# Patient Record
Sex: Male | Born: 1950
Health system: Southern US, Community
[De-identification: ages and names within clinical notes are randomized; demographics above are authoritative.]

## PROBLEM LIST (undated history)

## (undated) DIAGNOSIS — M1990 Unspecified osteoarthritis, unspecified site: Secondary | ICD-10-CM

## (undated) DIAGNOSIS — C801 Malignant (primary) neoplasm, unspecified: Secondary | ICD-10-CM

## (undated) DIAGNOSIS — E119 Type 2 diabetes mellitus without complications: Secondary | ICD-10-CM

## (undated) DIAGNOSIS — I10 Essential (primary) hypertension: Secondary | ICD-10-CM

## (undated) DIAGNOSIS — D497 Neoplasm of unspecified behavior of endocrine glands and other parts of nervous system: Secondary | ICD-10-CM

## (undated) DIAGNOSIS — E785 Hyperlipidemia, unspecified: Secondary | ICD-10-CM

## (undated) DIAGNOSIS — L659 Nonscarring hair loss, unspecified: Secondary | ICD-10-CM

## (undated) DIAGNOSIS — G4700 Insomnia, unspecified: Secondary | ICD-10-CM

## (undated) DIAGNOSIS — Z87442 Personal history of urinary calculi: Secondary | ICD-10-CM

## (undated) HISTORY — DX: Hyperlipidemia, unspecified: E78.5

## (undated) HISTORY — PX: APPENDECTOMY: SHX54

## (undated) HISTORY — PX: LITHOTRIPSY: SUR834

## (undated) HISTORY — PX: REPLACEMENT TOTAL KNEE: SUR1224

## (undated) HISTORY — PX: COLONOSCOPY: SHX174

## (undated) HISTORY — DX: Unspecified osteoarthritis, unspecified site: M19.90

## (undated) HISTORY — PX: JOINT REPLACEMENT: SHX530

## (undated) MED FILL — Finasteride Tab 5 MG: 5.0000 mg | ORAL | Fill #1 | Status: AC

---

## 1999-01-31 ENCOUNTER — Ambulatory Visit (HOSPITAL_COMMUNITY): Admission: RE | Admit: 1999-01-31 | Discharge: 1999-01-31 | Payer: Self-pay | Admitting: Emergency Medicine

## 1999-01-31 ENCOUNTER — Encounter: Payer: Self-pay | Admitting: Emergency Medicine

## 2002-01-12 ENCOUNTER — Inpatient Hospital Stay (HOSPITAL_COMMUNITY): Admission: RE | Admit: 2002-01-12 | Discharge: 2002-01-14 | Payer: Self-pay | Admitting: Orthopedic Surgery

## 2002-01-12 ENCOUNTER — Encounter: Payer: Self-pay | Admitting: Orthopedic Surgery

## 2008-06-07 ENCOUNTER — Ambulatory Visit: Payer: Self-pay | Admitting: Family Medicine

## 2009-07-27 ENCOUNTER — Ambulatory Visit (HOSPITAL_COMMUNITY): Admission: RE | Admit: 2009-07-27 | Discharge: 2009-07-27 | Payer: Self-pay | Admitting: Gastroenterology

## 2009-08-01 LAB — HM COLONOSCOPY

## 2010-07-05 NOTE — H&P (Signed)
   NAME:  Adam Ware, Adam Ware NO.:  [id number]   MEDICAL RECORD NO.:  [id number]                   PATIENT TYPE:  OIB   LOCATION:  2550                                 FACILITY:  [facility]   PHYSICIAN:  Nadara Mustard, M.D.                DATE OF BIRTH:  [birth year]   DATE OF ADMISSION:  01/12/2002  DATE OF DISCHARGE:                                HISTORY & PHYSICAL   HISTORY OF PRESENT ILLNESS:  The patient is a 60 year old gentleman status  post medial and lateral meniscectomies through large open incisions. The  patient has chronic knee pain, pain with activities of daily living. The  patient has failed conservative care which has included arthroscopy, steroid  injections, Synvisc injections without relief. He is unable to perform  activities of daily living. Radiograph shows tricompartmental osteoarthritic  changes, and the patient presents at this time for a total arthroplasty.   ALLERGIES:  No known drug allergies.   MEDICATIONS:  1. Lipitor.  2. Propecia.   PAST HISTORY:  Positive for open medial and lateral meniscectomy surgeries.   SOCIAL HISTORY:  Negative for tobacco. Alcohol socially.   FAMILY HISTORY:  Positive for heart disease and diabetes.   PHYSICAL EXAMINATION:  VITAL SIGNS:  Temperature 97.4, heart rate 88,  respiratory rate 24, blood pressure 118/86.  GENERAL:  The patient is in no acute distress.  LUNGS:  Clear to auscultation.  CARDIOVASCULAR:  Regular, rate, and rhythm.  NECK:  Supple, no bruits.  EXTREMITIES:  Examination of the right knee has range of motion from 5 to  90% with no varus or valgus deformity.   Radiographs show tricompartmental osteoarthritic changes of the right knee.   ASSESSMENT:  Tricompartmental osteoarthritic changes, right knee.   PLAN:  Initially, we had planned to try to proceed with a unicompartmental  arthroplasty; however, with the most recent radiographs obtained that has  tricompartmental  involvement, it was felt that unicompartmental arthroplasty  would not be a good option. The patient presents at this time for a total  knee arthroplasty with the risks of infection, neurovascular injury,  persistent pain, need for additional surgery, DVT, pulmonary embolus were  discussed. The patient states he understands and wishes to proceed at this  time.                                              Nadara Mustard, M.D.   MVD/MEDQ  D:  01/12/2002  T:  01/12/2002  Job:  [id number]

## 2010-07-05 NOTE — Op Note (Signed)
NAME:  Adam Ware, Adam Ware NO.:  [id number]   MEDICAL RECORD NO.:  [id number]                   PATIENT TYPE:  OIB   LOCATION:  2550                                 FACILITY:  [facility]   PHYSICIAN:  Nadara Mustard, M.D.                DATE OF BIRTH:  [birth year]   DATE OF PROCEDURE:  01/12/2002  DATE OF DISCHARGE:                                 OPERATIVE REPORT   PREOPERATIVE DIAGNOSIS:  Tricompartmental osteoarthritis, right knee.   POSTOPERATIVE DIAGNOSIS:  Tricompartmental osteoarthritis, right knee.   OPERATION PERFORMED:  Right total knee arthroplasty with a #11 femoral  component, Osteonics #9 tibial tray with a 10 mm flex insert posterior  stabilized with a #9 patella.   SURGEON:  Nadara Mustard, M.D.   ANESTHESIA:  General.   ESTIMATED BLOOD LOSS:  Minimal.   ANTIBIOTICS:  1 g of Kefzol.   TOURNIQUET TIME:  108 minutes at 350 mmHg.   DISPOSITION:  To PACU in stable condition.   INDICATIONS FOR PROCEDURE:  The patient is a 60 year old gentleman with  chronic osteoarthritis of the right knee, status post meniscectomy, who has  failed conservative care and presents at this time for total knee  arthroplasty.  The risks and benefits were discussed.  The patient states he  understands and wishes to proceed at this time.   DESCRIPTION OF PROCEDURE:  The patient was brought to operating room 4 and  underwent a general anesthetic.  After an adequate level of anesthesia was  obtained, the patient's right lower extremity was prepped using DuraPrep and  draped into a sterile field.  Collier Flowers was used to cover all exposed skin.  A  midline incision was made over the patella.  A medial parapatellar  retinacular incision was made.  The patella had huge osteophytes on it and  was unable to be subluxed laterally.  The osteophytes were then removed and  the patella was resurfaced with 10 mm of the patella excised.  This allowed  easy movement of the patella  during the procedure.  The femoral cutting  guide was then used.  The intramedullary rod was placed in the femur and 12  mm were taken off the distal femur and the cutting block was sized for 11  and the Chamfer cuts were made for an 11 femur.  Attention was then focused  on the tibia.  This was set with a 5 degree posterior slope with neutral  varus or valgus alignment with the alignment rod down the first webspace and  4 mm plus 2 mm were taken off the tibia and the meniscal components and  debris were removed.  The contracture of the posterior capsule was also  released.  The box cut was then used to make the box cuts for the posterior  stabilized femur.  The drill holes were made in the patella for the #9  patella resurface.  The trial components were placed on the femur and tibia.  This showed full range of motion with full extension and full flexion of 130  degrees.  He had stable varus and valgus stress.  Instruments were removed.  Trials were removed.  The wound was irrigated with normal saline.  The  tibial component was marked and the tibial alignment again aligned down the  first webspace.  The cement was mixed and the femoral and tibial components  were cemented.  The cement was hardening quite rapidly and a new batch of  cement was mixed to secure the patella.  After all three components were  stabilized and the cement had hardened, the tibial tray was inserted.  The  knee was placed through a full range of motion.  There was no subluxation of  the patella and the patella tracked midline.  The tourniquet was deflated  after 108 minutes.  Hemostasis was obtained.  The capsule was closed using a  #1 Vicryl.  The deep subcu was closed using a 0 Vicryl.  The subcu was  closed using 2-0 Vicryl and the skin was closed using proximate staples.  The wound was covered with Adaptic orthopedic sponges, sterile Webril and an  Ace wrap from toes to thigh.  Prior to applying the dressing, 20  cc of 0.5%  Marcaine were injected into the joint.  The patient then received a femoral  nerve block by anesthesia.  E-Z ice pack was applied.  The patient was  extubated and taken to PACU in stable condition.                                                 Nadara Mustard, M.D.    MVD/MEDQ  D:  01/12/2002  T:  01/12/2002  Job:  [id number]

## 2012-04-06 ENCOUNTER — Other Ambulatory Visit: Payer: Self-pay | Admitting: Medical

## 2012-06-17 ENCOUNTER — Telehealth: Payer: Self-pay | Admitting: *Deleted

## 2012-06-17 MED ORDER — SULFAMETHOXAZOLE-TRIMETHOPRIM 800-160 MG PO TABS: 1.0000 | ORAL_TABLET | Freq: Two times a day (BID) | ORAL | Status: DC

## 2012-06-17 NOTE — Telephone Encounter (Signed)
Rx sent to [facility]. 

## 2012-06-19 ENCOUNTER — Telehealth: Payer: Self-pay | Admitting: Family Medicine

## 2012-06-19 DIAGNOSIS — L0390 Cellulitis, unspecified: Secondary | ICD-10-CM

## 2012-06-19 MED ORDER — CLINDAMYCIN HCL 300 MG PO CAPS: 600.0000 mg | ORAL_CAPSULE | Freq: Three times a day (TID) | ORAL | Status: DC

## 2012-06-19 MED ORDER — SULFAMETHOXAZOLE-TRIMETHOPRIM 800-160 MG PO TABS: 1.0000 | ORAL_TABLET | Freq: Two times a day (BID) | ORAL | Status: DC

## 2012-06-19 NOTE — Telephone Encounter (Signed)
Worsening cellulitis on nose, despite use of Septra DS.  He spoke with ID who recommended increasing Septra to TID and adding Clinda 600mg  TID

## 2012-08-09 ENCOUNTER — Other Ambulatory Visit: Payer: Self-pay | Admitting: Medical

## 2012-08-09 MED ORDER — FINASTERIDE 5 MG PO TABS: 5.0000 mg | ORAL_TABLET | Freq: Every day | ORAL | Status: DC

## 2012-09-07 ENCOUNTER — Other Ambulatory Visit: Payer: Self-pay | Admitting: Medical

## 2012-09-07 DIAGNOSIS — L0390 Cellulitis, unspecified: Secondary | ICD-10-CM

## 2012-09-07 MED ORDER — SULFAMETHOXAZOLE-TRIMETHOPRIM 800-160 MG PO TABS: 1.0000 | ORAL_TABLET | Freq: Two times a day (BID) | ORAL | Status: DC

## 2012-10-21 ENCOUNTER — Telehealth: Payer: Self-pay | Admitting: Family Medicine

## 2012-10-21 ENCOUNTER — Other Ambulatory Visit: Payer: 59

## 2012-10-21 DIAGNOSIS — L0291 Cutaneous abscess, unspecified: Secondary | ICD-10-CM

## 2012-10-21 DIAGNOSIS — T8130XA Disruption of wound, unspecified, initial encounter: Secondary | ICD-10-CM

## 2012-10-21 DIAGNOSIS — L0390 Cellulitis, unspecified: Secondary | ICD-10-CM

## 2012-10-21 DIAGNOSIS — A4902 Methicillin resistant Staphylococcus aureus infection, unspecified site: Secondary | ICD-10-CM

## 2012-10-21 MED ORDER — MUPIROCIN CALCIUM 2 % NA OINT: TOPICAL_OINTMENT | NASAL | Status: DC

## 2012-10-21 MED ORDER — SULFAMETHOXAZOLE-TRIMETHOPRIM 800-160 MG PO TABS: 1.0000 | ORAL_TABLET | Freq: Two times a day (BID) | ORAL | Status: AC

## 2012-10-21 NOTE — Telephone Encounter (Signed)
Recurrent rash--pustule on upper right forearm was drained--needs order for culture.  Also had wound on hand, and new multiple erythematous lesions on RLE.  Suspect MRSA.  Needs culture and treatment.  Suspect possible carrier given recurrences.

## 2012-10-24 LAB — WOUND CULTURE
Gram Stain: NONE SEEN
Gram Stain: NONE SEEN
Gram Stain: NONE SEEN

## 2013-01-26 ENCOUNTER — Other Ambulatory Visit: Payer: Self-pay | Admitting: Family Medicine

## 2013-01-26 NOTE — Telephone Encounter (Signed)
Is this okay to refill? 

## 2013-01-26 NOTE — Telephone Encounter (Signed)
Sending this to your per Dr.Knapp.

## 2013-01-26 NOTE — Telephone Encounter (Signed)
This is not something I have ever discussed with him.  Looks like Vincenza Hews has filled it in the past--you can check with him (and do under his name if he authorizes)

## 2013-04-25 ENCOUNTER — Other Ambulatory Visit: Payer: Self-pay | Admitting: Medical

## 2013-04-25 ENCOUNTER — Other Ambulatory Visit: Payer: Self-pay | Admitting: Family Medicine

## 2013-04-25 DIAGNOSIS — Z79899 Other long term (current) drug therapy: Secondary | ICD-10-CM

## 2013-04-25 DIAGNOSIS — R5383 Other fatigue: Secondary | ICD-10-CM

## 2013-04-25 DIAGNOSIS — E785 Hyperlipidemia, unspecified: Secondary | ICD-10-CM

## 2013-04-25 DIAGNOSIS — R21 Rash and other nonspecific skin eruption: Secondary | ICD-10-CM

## 2013-04-25 DIAGNOSIS — Z209 Contact with and (suspected) exposure to unspecified communicable disease: Secondary | ICD-10-CM

## 2013-04-25 DIAGNOSIS — Z139 Encounter for screening, unspecified: Secondary | ICD-10-CM

## 2013-04-25 DIAGNOSIS — R5381 Other malaise: Secondary | ICD-10-CM

## 2013-04-25 MED ORDER — DOXYCYCLINE HYCLATE 100 MG PO TABS: 100.0000 mg | ORAL_TABLET | Freq: Two times a day (BID) | ORAL | Status: DC

## 2013-04-25 NOTE — Progress Notes (Signed)
Difficulty with fatigue, new onset rash. Slightly productive cough Hepatitis C ordered due to age

## 2013-04-26 LAB — CMP AND LIVER
ALT: 30 U/L (ref 0–53)
AST: 22 U/L (ref 0–37)
Albumin: 4.3 g/dL (ref 3.5–5.2)
Alkaline Phosphatase: 94 U/L (ref 39–117)
BUN: 14 mg/dL (ref 6–23)
Bilirubin, Direct: 0.1 mg/dL (ref 0.0–0.3)
CO2: 26 mEq/L (ref 19–32)
Calcium: 8.8 mg/dL (ref 8.4–10.5)
Chloride: 104 mEq/L (ref 96–112)
Creat: 0.88 mg/dL (ref 0.50–1.35)
Glucose, Bld: 159 mg/dL — ABNORMAL HIGH (ref 70–99)
Indirect Bilirubin: 0.5 mg/dL (ref 0.2–1.2)
Potassium: 4.1 mEq/L (ref 3.5–5.3)
Sodium: 139 mEq/L (ref 135–145)
Total Bilirubin: 0.6 mg/dL (ref 0.2–1.2)
Total Protein: 6.4 g/dL (ref 6.0–8.3)

## 2013-04-26 LAB — CBC WITH DIFFERENTIAL/PLATELET
Basophils Absolute: 0 10*3/uL (ref 0.0–0.1)
Basophils Relative: 0 % (ref 0–1)
Eosinophils Absolute: 0 10*3/uL (ref 0.0–0.7)
Eosinophils Relative: 0 % (ref 0–5)
HCT: 40.9 % (ref 39.0–52.0)
Hemoglobin: 13.8 g/dL (ref 13.0–17.0)
Lymphocytes Relative: 14 % (ref 12–46)
Lymphs Abs: 1.4 10*3/uL (ref 0.7–4.0)
MCH: 28.9 pg (ref 26.0–34.0)
MCHC: 33.7 g/dL (ref 30.0–36.0)
MCV: 85.7 fL (ref 78.0–100.0)
Monocytes Absolute: 0.2 10*3/uL (ref 0.1–1.0)
Monocytes Relative: 2 % — ABNORMAL LOW (ref 3–12)
Neutro Abs: 8.3 10*3/uL — ABNORMAL HIGH (ref 1.7–7.7)
Neutrophils Relative %: 84 % — ABNORMAL HIGH (ref 43–77)
Platelets: 289 10*3/uL (ref 150–400)
RBC: 4.77 MIL/uL (ref 4.22–5.81)
RDW: 14.4 % (ref 11.5–15.5)
WBC: 9.9 10*3/uL (ref 4.0–10.5)

## 2013-04-26 LAB — HIV ANTIBODY (ROUTINE TESTING W REFLEX): HIV: NONREACTIVE

## 2013-04-26 LAB — LIPID PANEL
Cholesterol: 101 mg/dL (ref 0–200)
HDL: 29 mg/dL — ABNORMAL LOW (ref >39)
LDL Cholesterol: 49 mg/dL (ref 0–99)
Total CHOL/HDL Ratio: 3.5 Ratio
Triglycerides: 116 mg/dL (ref <150)
VLDL: 23 mg/dL (ref 0–40)

## 2013-04-26 LAB — HEPATITIS C ANTIBODY: HCV Ab: NEGATIVE

## 2013-04-26 LAB — PROTIME-INR
INR: 1.09 (ref <1.50)
Prothrombin Time: 14 seconds (ref 11.6–15.2)

## 2013-04-26 LAB — APTT: aPTT: 30 seconds (ref 24–37)

## 2013-04-26 LAB — HEPATITIS B SURFACE ANTIGEN: Hepatitis B Surface Ag: NEGATIVE

## 2013-05-23 ENCOUNTER — Other Ambulatory Visit: Payer: Self-pay | Admitting: Medical

## 2013-05-23 DIAGNOSIS — R3129 Other microscopic hematuria: Secondary | ICD-10-CM

## 2013-05-23 MED ORDER — DOXYCYCLINE HYCLATE 100 MG PO TABS: 100.0000 mg | ORAL_TABLET | Freq: Two times a day (BID) | ORAL | Status: DC

## 2013-05-25 ENCOUNTER — Other Ambulatory Visit: Payer: Self-pay | Admitting: Medical

## 2013-05-25 ENCOUNTER — Telehealth: Payer: Self-pay | Admitting: Medical

## 2013-05-25 ENCOUNTER — Telehealth: Payer: Self-pay | Admitting: Family Medicine

## 2013-05-25 DIAGNOSIS — R319 Hematuria, unspecified: Secondary | ICD-10-CM

## 2013-05-25 NOTE — Telephone Encounter (Signed)
Had few days of nausea, upset stomach, darker urine.  UA showed microscopic hematuria.  Discussed next steps.   Prostate exam declined at patient request.   Will treat empirically with Doxycycline.  Recheck urine 2 wk.

## 2013-05-25 NOTE — Telephone Encounter (Signed)
PT advised needs CT abd/pelvis with and without contrast for Hematuria protocol.  Called [place] [phone] t/w Richardson Landry he will review schedule and call me back.  He states no CT on weekends.

## 2013-05-26 ENCOUNTER — Ambulatory Visit (HOSPITAL_COMMUNITY)
Admission: RE | Admit: 2013-05-26 | Discharge: 2013-05-26 | Disposition: A | Discharge status: Home / Self Care | Payer: 59 | Source: Ambulatory Visit | Attending: Medical | Admitting: Medical

## 2013-05-26 ENCOUNTER — Other Ambulatory Visit: Payer: Self-pay | Admitting: Medical

## 2013-05-26 ENCOUNTER — Encounter (HOSPITAL_COMMUNITY): Payer: Self-pay

## 2013-05-26 DIAGNOSIS — R319 Hematuria, unspecified: Secondary | ICD-10-CM

## 2013-05-26 DIAGNOSIS — E279 Disorder of adrenal gland, unspecified: Secondary | ICD-10-CM | POA: Insufficient documentation

## 2013-05-26 DIAGNOSIS — N281 Cyst of kidney, acquired: Secondary | ICD-10-CM | POA: Insufficient documentation

## 2013-05-26 DIAGNOSIS — N201 Calculus of ureter: Secondary | ICD-10-CM | POA: Insufficient documentation

## 2013-05-26 DIAGNOSIS — N200 Calculus of kidney: Secondary | ICD-10-CM | POA: Insufficient documentation

## 2013-05-26 DIAGNOSIS — M4310 Spondylolisthesis, site unspecified: Secondary | ICD-10-CM | POA: Insufficient documentation

## 2013-05-26 MED ORDER — IOHEXOL 300 MG/ML  SOLN
125.0000 mL | Freq: Once | INTRAMUSCULAR | Status: AC | PRN
  Administered 2013-05-26: 125 mL via INTRAVENOUS

## 2013-08-15 ENCOUNTER — Ambulatory Visit (INDEPENDENT_AMBULATORY_CARE_PROVIDER_SITE_OTHER): Payer: 59 | Admitting: Family Medicine

## 2013-08-15 DIAGNOSIS — M25562 Pain in left knee: Secondary | ICD-10-CM

## 2013-08-15 DIAGNOSIS — M25569 Pain in unspecified knee: Secondary | ICD-10-CM

## 2013-08-15 NOTE — Progress Notes (Signed)
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  He has been having increasing pain at medial joint line over the last month or so.  Inferolateral approach was taken.  Prepped with betadine, and after applying ethyl chloride spray, the knee was injected with 40mg  kenalog, 2cc 2% lidocaine and 2cc Marcaine.  Pt tolerated the procedure well without complication, and with improvement in medial knee pain

## 2013-10-17 ENCOUNTER — Other Ambulatory Visit (INDEPENDENT_AMBULATORY_CARE_PROVIDER_SITE_OTHER): Payer: 59

## 2013-10-17 DIAGNOSIS — Z23 Encounter for immunization: Secondary | ICD-10-CM

## 2013-10-17 DIAGNOSIS — Z2911 Encounter for prophylactic immunotherapy for respiratory syncytial virus (RSV): Secondary | ICD-10-CM

## 2014-01-23 ENCOUNTER — Other Ambulatory Visit: Payer: Self-pay | Admitting: *Deleted

## 2014-01-23 MED ORDER — ZOLPIDEM TARTRATE 10 MG PO TABS: 10.0000 mg | ORAL_TABLET | Freq: Every evening | ORAL | Status: DC | PRN

## 2014-03-25 ENCOUNTER — Emergency Department (HOSPITAL_COMMUNITY)
Admission: EM | Admit: 2014-03-25 | Discharge: 2014-03-25 | Disposition: A | Discharge status: Home / Self Care | Payer: 59 | Attending: Emergency Medicine | Admitting: Emergency Medicine

## 2014-03-25 ENCOUNTER — Encounter (HOSPITAL_COMMUNITY): Payer: Self-pay

## 2014-03-25 ENCOUNTER — Emergency Department (HOSPITAL_COMMUNITY): Payer: 59

## 2014-03-25 DIAGNOSIS — Z79899 Other long term (current) drug therapy: Secondary | ICD-10-CM | POA: Insufficient documentation

## 2014-03-25 DIAGNOSIS — Z9089 Acquired absence of other organs: Secondary | ICD-10-CM | POA: Diagnosis not present

## 2014-03-25 DIAGNOSIS — K5732 Diverticulitis of large intestine without perforation or abscess without bleeding: Secondary | ICD-10-CM | POA: Diagnosis not present

## 2014-03-25 DIAGNOSIS — Z792 Long term (current) use of antibiotics: Secondary | ICD-10-CM | POA: Diagnosis not present

## 2014-03-25 DIAGNOSIS — R1032 Left lower quadrant pain: Secondary | ICD-10-CM

## 2014-03-25 LAB — COMPREHENSIVE METABOLIC PANEL
ALT: 26 U/L (ref 0–53)
AST: 26 U/L (ref 0–37)
Albumin: 3.9 g/dL (ref 3.5–5.2)
Alkaline Phosphatase: 98 U/L (ref 39–117)
Anion gap: 5 (ref 5–15)
BUN: 14 mg/dL (ref 6–23)
CO2: 26 mmol/L (ref 19–32)
Calcium: 8.7 mg/dL (ref 8.4–10.5)
Chloride: 107 mmol/L (ref 96–112)
Creatinine, Ser: 0.9 mg/dL (ref 0.50–1.35)
GFR calc Af Amer: >90 mL/min (ref >90)
GFR calc non Af Amer: 87 mL/min — ABNORMAL LOW (ref >90)
Glucose, Bld: 168 mg/dL — ABNORMAL HIGH (ref 70–99)
Potassium: 4.1 mmol/L (ref 3.5–5.1)
Sodium: 138 mmol/L (ref 135–145)
Total Bilirubin: 0.9 mg/dL (ref 0.3–1.2)
Total Protein: 6.5 g/dL (ref 6.0–8.3)

## 2014-03-25 LAB — CBC WITH DIFFERENTIAL/PLATELET
Basophils Absolute: 0 10*3/uL (ref 0.0–0.1)
Basophils Relative: 0 % (ref 0–1)
Eosinophils Absolute: 0.1 10*3/uL (ref 0.0–0.7)
Eosinophils Relative: 0 % (ref 0–5)
HCT: 42.6 % (ref 39.0–52.0)
Hemoglobin: 14.3 g/dL (ref 13.0–17.0)
Lymphocytes Relative: 28 % (ref 12–46)
Lymphs Abs: 3.8 10*3/uL (ref 0.7–4.0)
MCH: 29.7 pg (ref 26.0–34.0)
MCHC: 33.6 g/dL (ref 30.0–36.0)
MCV: 88.4 fL (ref 78.0–100.0)
Monocytes Absolute: 0.8 10*3/uL (ref 0.1–1.0)
Monocytes Relative: 6 % (ref 3–12)
Neutro Abs: 8.8 10*3/uL — ABNORMAL HIGH (ref 1.7–7.7)
Neutrophils Relative %: 66 % (ref 43–77)
Platelets: 219 10*3/uL (ref 150–400)
RBC: 4.82 MIL/uL (ref 4.22–5.81)
RDW: 12.8 % (ref 11.5–15.5)
WBC: 13.5 10*3/uL — ABNORMAL HIGH (ref 4.0–10.5)

## 2014-03-25 LAB — LIPASE, BLOOD: Lipase: 22 U/L (ref 11–59)

## 2014-03-25 MED ORDER — IOHEXOL 300 MG/ML  SOLN
50.0000 mL | Freq: Once | INTRAMUSCULAR | Status: AC | PRN
  Administered 2014-03-25: 50 mL via ORAL

## 2014-03-25 MED ORDER — IOHEXOL 300 MG/ML  SOLN
100.0000 mL | Freq: Once | INTRAMUSCULAR | Status: AC | PRN
  Administered 2014-03-25: 100 mL via INTRAVENOUS

## 2014-03-25 MED ORDER — OXYCODONE-ACETAMINOPHEN 5-325 MG PO TABS: 1.0000 | ORAL_TABLET | Freq: Four times a day (QID) | ORAL | Status: DC | PRN

## 2014-03-25 MED ORDER — CIPROFLOXACIN HCL 500 MG PO TABS: 500.0000 mg | ORAL_TABLET | Freq: Two times a day (BID) | ORAL | Status: AC

## 2014-03-25 MED ORDER — METRONIDAZOLE 500 MG PO TABS: 500.0000 mg | ORAL_TABLET | Freq: Three times a day (TID) | ORAL | Status: AC

## 2014-03-25 NOTE — ED Notes (Signed)
Pt c/o increasing abdominal pain and perineal pain when sitting x 3 days.  Pain score 3/10.  Denies n/v/d.  Pt feels that it might be early diverticulitis.  Pt has spoken w/ Dr. Collene Mares about symptoms.

## 2014-03-25 NOTE — ED Notes (Signed)
He continues to experience low abd. Discomfort whenever he shifts position; and is in no distress.  To CT at this time.

## 2014-03-25 NOTE — ED Provider Notes (Signed)
CSN: [id number]     Arrival date & time 03/25/14  0902 History   First MD Initiated Contact with Patient 03/25/14 0915     Chief Complaint  Patient presents with  . Abdominal Pain     (Consider location/radiation/quality/duration/timing/severity/associated sxs/prior Treatment) Patient is a 64 y.o. male presenting with abdominal pain. The history is provided by the patient.  Abdominal Pain Pain location:  Suprapubic and LLQ Pain quality: aching   Pain radiates to:  Does not radiate Pain severity:  Mild Onset quality:  Gradual Duration:  3 days Timing:  Constant Progression:  Unchanged Chronicity:  New Context comment:  Spontaneously Relieved by:  Nothing Worsened by:  Nothing tried Ineffective treatments:  None tried Associated symptoms: no chest pain, no cough, no diarrhea, no dysuria, no fever, no hematuria, no nausea, no shortness of breath and no vomiting     History reviewed. No pertinent past medical history. Past Surgical History  Procedure Laterality Date  . Appendectomy    . Joint replacement    . Replacement total knee Right    History reviewed. No pertinent family history. History  Substance Use Topics  . Smoking status: Never Smoker   . Smokeless tobacco: Not on file  . Alcohol Use: Yes    Review of Systems  Constitutional: Negative for fever.  HENT: Negative for drooling and rhinorrhea.   Eyes: Negative for pain.  Respiratory: Negative for cough and shortness of breath.   Cardiovascular: Negative for chest pain and leg swelling.  Gastrointestinal: Positive for abdominal pain. Negative for nausea, vomiting and diarrhea.  Genitourinary: Negative for dysuria and hematuria.  Musculoskeletal: Negative for gait problem and neck pain.  Skin: Negative for color change.  Neurological: Negative for numbness and headaches.  Hematological: Negative for adenopathy.  Psychiatric/Behavioral: Negative for behavioral problems.  All other systems reviewed and are  negative.     Allergies  Review of patient's allergies indicates no known allergies.  Home Medications   Prior to Admission medications   Medication Sig Start Date End Date Taking? Authorizing Provider  finasteride (PROSCAR) 5 MG tablet TAKE 1 TABLET BY MOUTH EVERY DAY 01/26/13   Carlena Hurl, PA-C  mupirocin nasal ointment (BACTROBAN NASAL) 2 % Apply to anterior nares twice daily for 5-7 days. 10/21/12   Rita Ohara, MD  zolpidem (AMBIEN) 10 MG tablet Take 1 tablet (10 mg total) by mouth at bedtime as needed for sleep. 01/23/14   Rita Ohara, MD   BP 167/80 mmHg  Pulse 97  Temp(Src) 98.3 F (36.8 C) (Oral)  Resp 16  SpO2 96% Physical Exam  Constitutional: He is oriented to person, place, and time. He appears well-developed and well-nourished.  HENT:  Head: Normocephalic and atraumatic.  Right Ear: External ear normal.  Left Ear: External ear normal.  Nose: Nose normal.  Mouth/Throat: Oropharynx is clear and moist. No oropharyngeal exudate.  Eyes: Conjunctivae and EOM are normal. Pupils are equal, round, and reactive to light.  Neck: Normal range of motion. Neck supple.  Cardiovascular: Normal rate, regular rhythm, normal heart sounds and intact distal pulses.  Exam reveals no gallop and no friction rub.   No murmur heard. Pulmonary/Chest: Effort normal and breath sounds normal. No respiratory distress. He has no wheezes.  Abdominal: Soft. Bowel sounds are normal. He exhibits no distension. There is tenderness (mild suprapubic ttp). There is no rebound and no guarding.  Musculoskeletal: Normal range of motion. He exhibits no edema or tenderness.  Neurological: He is alert and oriented  to person, place, and time.  Skin: Skin is warm and dry.  Psychiatric: He has a normal mood and affect. His behavior is normal.  Nursing note and vitals reviewed.   ED Course  Procedures (including critical care time) Labs Review Labs Reviewed  CBC WITH DIFFERENTIAL/PLATELET - Abnormal;  Notable for the following:    WBC 13.5 (*)    Neutro Abs 8.8 (*)    All other components within normal limits  COMPREHENSIVE METABOLIC PANEL - Abnormal; Notable for the following:    Glucose, Bld 168 (*)    GFR calc non Af Amer 87 (*)    All other components within normal limits  LIPASE, BLOOD  URINALYSIS, ROUTINE W REFLEX MICROSCOPIC    Imaging Review Ct Abdomen Pelvis W Contrast  03/25/2014   CLINICAL DATA:  Increasing abdominal and peroneal pain while sitting for the past 3 days. Concern for diverticulitis.  EXAM: CT ABDOMEN AND PELVIS WITH CONTRAST  TECHNIQUE: Multidetector CT imaging of the abdomen and pelvis was performed using the standard protocol following bolus administration of intravenous contrast.  CONTRAST:  153mL OMNIPAQUE IOHEXOL 300 MG/ML SOLN, 71mL OMNIPAQUE IOHEXOL 300 MG/ML SOLN  COMPARISON:  CT abdomen and pelvis - 05/26/2013 in  FINDINGS: Ingestion enteric contrast extends to the level of the distal small bowel. Extensive diverticulosis involving the distal sigmoid colon with mesenteric stranding about the left side of the sigmoid colon within the lower pelvis (representative image 75, series 2). No evidence of perforation or definable/drainable fluid collection. No evidence of enteric obstruction. The bowel is otherwise normal in course and caliber without discrete area of wall thickening. The appendix is not visualized. No pneumoperitoneum, pneumatosis or portal venous gas.  Normal hepatic contour. No discrete hepatic lesions. Normal appearance of the gallbladder given underdistention. No radiopaque gallstones. No intra or extrahepatic biliary duct dilatation. No ascites.  There is symmetric enhancement and excretion of the bilateral kidneys. Subcentimeter hypo attenuating right-sided renal lesion is grossly unchanged the wall too small to accurately characterize is favored to represent a renal cyst. No discrete left-sided renal lesions. Unchanged punctate (approximately 0.8 cm)  nonobstructing stone within the inferior pole of the left kidney (image 38, series 2). Additional punctate left-sided renal stones are not well demonstrated on this postcontrast examination. No discrete right-sided renal stones. No urinary obstruction or perinephric stranding.  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 Normal appearance of the left adrenal gland, pancreas and spleen.  Scattered atherosclerotic plaque within a normal caliber abdominal aorta. There is mild fusiform ectasia of the bilateral common iliac arteries, with the right measuring approximately 1.7 cm maximal oblique coronal dimension (coronal image 59, series 5) and the left measuring approximately 1.6 cm (image 54, series 5), grossly unchanged. There is fusiform ectasia of the distal aspect of the celiac artery measuring 1.4 cm in maximal oblique sagittal dimension (image 68, series 6). Incidental note is made of a common origin of the celiac and SMA. Incidental note is made of a proximal origin of the right hepatic artery arising from the proximal aspect of the common hepatic artery. Otherwise, conventional arterial supply is demonstrated.  Limited visualization of the lower thorax demonstrates minimal subsegmental atelectasis within the imaged left lower lobe. No discrete focal airspace opacities. No pleural effusion.  Normal heart size.  No pericardial effusion.  No acute or aggressive osseous abnormalities. 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 Moderate DDD of L5-S1 with disc space height loss, endplate irregularity and sclerosis.  There is minimal residual postoperative stranding about the ventral abdominal wall. Regional soft tissues appear otherwise normal.   IMPRESSION: 1. Acute uncomplicated diverticulitis involving the distal sigmoid colon within the left hemipelvis. No evidence of perforation or definable/drainable fluid collection. 2. 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 Stability for almost 1 year suggests a benign etiology though this mass is technically indeterminate on the present examination. Further evaluation with nonemergent abdominal MRI is recommended. 3. Scattered atherosclerotic plaque within a normal caliber abdominal aorta. 4. 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 5. Unchanged punctate (approximately 0.8 cm) nonobstructing stone within the inferior pole the left kidney. 6. Unchanged bilateral L5 pars defects with associated grade 1 anterolisthesis of L5 upon S1 and associated mild to moderate DDD at this level.   Electronically Signed   By: Sandi Mariscal M.D.   On: 03/25/2014 11:29     EKG Interpretation None      MDM   Final diagnoses:  LLQ pain  Diverticulitis of large intestine without perforation or abscess without bleeding    9:26 AM 64 y.o. male who presents with left lower quadrant pain which has presented insidiously over the last 3 days. He denies any fevers, vomiting, or diarrhea. He has been having normal bowel movements. No pain during bowel movements. He has had some mild perineal discomfort. He notes that the left lower quadrant pain seemed to worsen today and presents for evaluation. We'll get screening labs and imaging. The patient has only mild pain at this time and would not like any pain medicine.  11:54 AM: Patient found to have one copy to diverticulitis. Dr. Collene Mares (GI) recommending Cipro and Flagyl for 10 days. Will also provide pain medicine to be used as needed. The patient was made aware of the  incidental adrenal mass and recommended follow-up.  I have discussed the diagnosis/risks/treatment options with the patient and believe the pt to be eligible for discharge home to follow-up with his pcp as needed. We also discussed returning to the ED immediately if new or worsening sx occur. We discussed the sx which are most concerning (e.g., worsening pain, fever) that necessitate immediate return. Medications administered to the patient during their visit and any new prescriptions provided to the patient are listed below.  Medications given during this visit Medications  iohexol (OMNIPAQUE) 300 MG/ML solution 50 mL (50 mLs Oral Contrast Given 03/25/14 0940)  iohexol (OMNIPAQUE) 300 MG/ML solution 100 mL (100 mLs Intravenous Contrast Given 03/25/14 1046)    New Prescriptions   CIPROFLOXACIN (CIPRO) 500 MG TABLET    Take 1 tablet (500 mg total) by mouth 2 (two) times daily.   METRONIDAZOLE (FLAGYL) 500 MG TABLET    Take 1 tablet (500 mg total) by mouth 3 (three) times daily.   OXYCODONE-ACETAMINOPHEN (PERCOCET) 5-325 MG PER TABLET    Take 1-2 tablets by mouth every 6 (six) hours as needed for moderate pain.     Pamella Pert, MD 03/25/14 1155

## 2014-03-25 NOTE — Discharge Instructions (Signed)
Diverticulitis °Diverticulitis is when small pockets that have formed in your colon (large intestine) become infected or swollen. °HOME CARE °· Follow your doctor's instructions. °· Follow a special diet if told by your doctor. °· When you feel better, your doctor may tell you to change your diet. You may be told to eat a lot of fiber. Fruits and vegetables are good sources of fiber. Fiber makes it easier to poop (have bowel movements). °· Take supplements or probiotics as told by your doctor. °· Only take medicines as told by your doctor. °· Keep all follow-up visits with your doctor. °GET HELP IF: °· Your pain does not get better. °· You have a hard time eating food. °· You are not pooping like normal. °GET HELP RIGHT AWAY IF: °· Your pain gets worse. °· Your problems do not get better. °· Your problems suddenly get worse. °· You have a fever. °· You keep throwing up (vomiting). °· You have bloody or black, tarry poop (stool). °MAKE SURE YOU:  °· Understand these instructions. °· Will watch your condition. °· Will get help right away if you are not doing well or get worse. °Document Released: 07/23/2007 Document Revised: 02/08/2013 Document Reviewed: 12/29/2012 °[employer]® Patient Information ©2015 [employer]. This information is not intended to replace advice given to you by your health care provider. Make sure you discuss any questions you have with your health care provider. ° °

## 2014-06-08 ENCOUNTER — Other Ambulatory Visit: Payer: Self-pay | Admitting: Urology

## 2014-06-09 ENCOUNTER — Encounter (HOSPITAL_COMMUNITY): Payer: Self-pay | Admitting: *Deleted

## 2014-06-14 NOTE — H&P (Signed)
  Active Problems Problems  1. Left nephrolithiasis (N20.0) 2. 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   Past Medical History Problems  1. History of No acute medical problems  Surgical History Problems  1. History of Appendectomy  Current Meds 1. Crestor TABS;  Therapy: (Recorded:20Apr2016) to Recorded 2. Finasteride TABS;  Therapy: (Recorded:20Apr2016) to Recorded  Allergies Medication  1. Sulfa Drugs  Family History Problems  1. No pertinent family history : Mother, Father  Social History Problems    Alcohol use (Z78.9)   Caffeine use (F15.90)   Tobacco smoking status unknown  Review of Systems  Genitourinary: hematuria.    Vitals Vital Signs [Data Includes: Last 1 Day]  Recorded: 20Apr2016 12:41PM  Height: 6 ft 1 in Weight: 200 lb  BMI Calculated: 26.39 BSA Calculated: 2.15 Blood Pressure: 139 / 88 Temperature: 97.8 F Heart Rate: 90  Physical Exam Constitutional: Well nourished and well developed . No acute distress.  ENT:. The ears and nose are normal in appearance.  Neck: The appearance of the neck is normal and no neck mass is present.  Pulmonary: No respiratory distress and normal respiratory rhythm and effort.  Cardiovascular: Heart rate and rhythm are normal . No peripheral edema.  Abdomen: The abdomen is soft and nontender. No masses are palpated. No CVA tenderness. No hernias are palpable. No hepatosplenomegaly noted.  Genitourinary: Examination of the penis demonstrates no discharge, no masses, no lesions and a normal meatus. The penis is circumcised. The scrotum is without lesions. The right epididymis is palpably normal and non-tender. The left epididymis is palpably normal and  non-tender. The right testis is non-tender and without masses. The left testis is non-tender and without masses.  Lymphatics: The femoral and inguinal nodes are not enlarged or tender.  Skin: Normal skin turgor, no visible rash and no visible skin lesions.  Neuro/Psych:. Mood and affect are appropriate.    Procedure  Procedure: Cystoscopy  Chaperone Present: laura.  Indication: Hematuria.  Informed Consent: Risks, benefits, and potential adverse events were discussed and informed consent was obtained from the patient.  Prep: The patient was prepped with betadine.  Anesthesia:. Local anesthesia was administered intraurethrally with 2% lidocaine jelly.  Antibiotic prophylaxis: Ciprofloxacin.  Procedure Note:  Urethral meatus:. No abnormalities.  Anterior urethra: No abnormalities.  Prostatic urethra: No abnormalities.  Bladder: Visulization was clear. The ureteral orifices were in the normal anatomic position bilaterally and had clear efflux of urine. A systematic survey of the bladder demonstrated no bladder tumors or stones. The mucosa was smooth without abnormalities. The patient tolerated the procedure well.  Complications: None.    Assessment Assessed  1. Left nephrolithiasis (N20.0) 2. Microscopic hematuria (R31.2)  CT reviewed from [facility], showing 32mm stone L renal pelvis. HU+ 1100, and distance to sidewall is 8 cm. I have advised him to consider lithotripsy, understanding that he might need JJ stent if stone gets stuck in the ureter. Cannot see on KUB with CT because of stool in the colon.He has active diverticulitis at present.   Plan Pt will call to schedule lithotripsy.   Signatures Electronically signed by : Carolan Clines, M.D.; Jun 07 2014  6:40PM EST

## 2014-06-15 ENCOUNTER — Ambulatory Visit (HOSPITAL_COMMUNITY)
Admission: RE | Admit: 2014-06-15 | Discharge: 2014-06-15 | Disposition: A | Discharge status: Home / Self Care | Payer: 59 | Source: Ambulatory Visit | Attending: Urology | Admitting: Urology

## 2014-06-15 ENCOUNTER — Encounter (HOSPITAL_COMMUNITY)
Admission: RE | Disposition: A | Discharge status: Home / Self Care | Payer: Self-pay | Source: Ambulatory Visit | Attending: Urology

## 2014-06-15 ENCOUNTER — Encounter (HOSPITAL_COMMUNITY): Payer: Self-pay | Admitting: General Practice

## 2014-06-15 ENCOUNTER — Ambulatory Visit (HOSPITAL_COMMUNITY): Payer: 59

## 2014-06-15 DIAGNOSIS — N200 Calculus of kidney: Secondary | ICD-10-CM | POA: Diagnosis present

## 2014-06-15 DIAGNOSIS — Z79899 Other long term (current) drug therapy: Secondary | ICD-10-CM | POA: Insufficient documentation

## 2014-06-15 DIAGNOSIS — R312 Other microscopic hematuria: Secondary | ICD-10-CM | POA: Insufficient documentation

## 2014-06-15 DIAGNOSIS — Z882 Allergy status to sulfonamides status: Secondary | ICD-10-CM | POA: Insufficient documentation

## 2014-06-15 SURGERY — LITHOTRIPSY, ESWL
Anesthesia: LOCAL | Laterality: Left

## 2014-06-15 MED ORDER — CIPROFLOXACIN HCL 500 MG PO TABS: 500.0000 mg | ORAL_TABLET | ORAL | Status: DC

## 2014-06-15 MED ORDER — DEXTROSE-NACL 5-0.45 % IV SOLN
INTRAVENOUS | Status: DC
  Administered 2014-06-15: 12:00:00 via INTRAVENOUS

## 2014-06-15 MED ORDER — DIAZEPAM 5 MG PO TABS
10.0000 mg | ORAL_TABLET | ORAL | Status: AC
  Administered 2014-06-15: 10 mg via ORAL
  Filled 2014-06-15: qty 2

## 2014-06-15 MED ORDER — DIPHENHYDRAMINE HCL 25 MG PO CAPS
25.0000 mg | ORAL_CAPSULE | ORAL | Status: AC
  Administered 2014-06-15: 25 mg via ORAL
  Filled 2014-06-15: qty 1

## 2014-06-15 NOTE — Interval H&P Note (Signed)
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 After consideration of risks, benefits and other options for treatment, the patient has consented to  Procedure(s): LEFT EXTRACORPOREAL SHOCK WAVE LITHOTRIPSY (ESWL) (Left) as a surgical intervention .  The patient's history has been reviewed, patient examined, no change in status, stable for surgery.  I have reviewed the patient's chart and labs.  Questions were answered to the patient's satisfaction.     Adam Ware I Jaislyn Blinn

## 2014-06-15 NOTE — Discharge Instructions (Signed)
Kidney Stones Kidney stones (urolithiasis) are solid masses that form inside your kidneys. The intense pain is caused by the stone moving through the kidney, ureter, bladder, and urethra (urinary tract). When the stone moves, the ureter starts to spasm around the stone. The stone is usually passed in your pee (urine).  HOME CARE  Drink enough fluids to keep your pee clear or pale yellow. This helps to get the stone out.  Strain all pee through the provided strainer. Do not pee without peeing through the strainer, not even once. If you pee the stone out, catch it in the strainer. The stone may be as small as a grain of salt. Take this to your doctor. This will help your doctor figure out what you can do to try to prevent more kidney stones.  Only take medicine as told by your doctor.  Follow up with your doctor as told.  Get follow-up X-rays as told by your doctor. GET HELP IF: You have pain that gets worse even if you have been taking pain medicine. GET HELP RIGHT AWAY IF:   Your pain does not get better with medicine.  You have a fever or shaking chills.  Your pain increases and gets worse over 18 hours.  You have new belly (abdominal) pain.  You feel faint or pass out.  You are unable to pee. MAKE SURE YOU:   Understand these instructions.  Will watch your condition.  Will get help right away if you are not doing well or get worse. Document Released: 07/23/2007 Document Revised: 10/06/2012 Document Reviewed: 07/07/2012 [facility] Patient Information 2015 [place], [place]. This information is not intended to replace advice given to you by your health care provider. Make sure you discuss any questions you have with your health care provider.  Lithotripsy for Kidney Stones Lithotripsy is a treatment that can sometimes help eliminate kidney stones and pain that they cause. A form of lithotripsy, also known as extracorporeal shock wave lithotripsy, is a nonsurgical procedure that  helps your body rid itself of the kidney stone when it is too big to pass on its own. Extracorporeal shock wave lithotripsy is a method of crushing a kidney stone with shock waves. These shock waves pass through your body and are focused on your stone. They cause the kidney stones to crumble while still in the urinary tract. It is then easier for the smaller pieces of stone to pass in the urine. Lithotripsy usually takes about an hour. It is done in a hospital, a lithotripsy center, or a mobile unit. It usually does not require an overnight stay. Your health care provider will instruct you on preparation for the procedure. Your health care provider will tell you what to expect afterward. LET [facility] CARE PROVIDER KNOW ABOUT:  Any allergies you have.  All medicines you are taking, including vitamins, herbs, eye drops, creams, and over-the-counter medicines.  Previous problems you or members of your family have had with the use of anesthetics.  Any blood disorders you have.  Previous surgeries you have had.  Medical conditions you have. RISKS AND COMPLICATIONS Generally, lithotripsy for kidney stones is a safe procedure. However, as with any procedure, complications can occur. Possible complications include:  Infection.  Bleeding of the kidney.  Bruising of the kidney or skin.  Obstruction of the ureter.  Failure of the stone to fragment. BEFORE THE PROCEDURE  Do not eat or drink for 6-8 hours prior to the procedure. You may, however, take the medications  with a sip of water that your physician instructs you to take  Do not take aspirin or aspirin-containing products for 7 days prior to your procedure  Do not take nonsteroidal anti-inflammatory products for 7 days prior to your procedure PROCEDURE A stent (flexible tube with holes) may be placed in your ureter. The ureter is the tube that transports the urine from the kidneys to the bladder. Your health care provider may place a  stent before the procedure. This will help keep urine flowing from the kidney if the fragments of the stone block the ureter. You may have an IV tube placed in one of your veins to give you fluids and medicines. These medicines may help you relax or make you sleep. During the procedure, you will lie comfortably on a fluid-filled cushion or in a warm-water bath. After an X-ray or ultrasound exam to locate your stone, shock waves are aimed at the stone. If you are awake, you may feel a tapping sensation as the shock waves pass through your body. If large stone particles remain after treatment, a second procedure may be necessary at a later date. For comfort during the test:  Relax as much as possible.  Try to remain still as much as possible.  Try to follow instructions to speed up the test.  Let your health care provider know if you are uncomfortable, anxious, or in pain. AFTER THE PROCEDURE  After surgery, you will be taken to the recovery area. A nurse will watch and check your progress. Once you're awake, stable, and taking fluids well, you will be allowed to go home as long as there are no problems. You will also be allowed to pass your urine before discharge.You may be given antibiotics to help prevent infection. You may also be prescribed pain medicine if needed. In a week or two, your health care provider may remove your stent, if you have one. You may first have an X-ray exam to check on how successful the fragmentation of your stone has been and how much of the stone has passed. Your health care provider will check to see whether or not stone particles remain. SEEK IMMEDIATE MEDICAL CARE IF:  You develop a fever or shaking chills.  Your pain is not relieved by medicine.  You feel sick to your stomach (nauseated) and you vomit.  You develop heavy bleeding.  You have difficulty urinating.  You start to pass your stent from your penis. Document Released: 02/01/2000 Document Revised:  11/24/2012 Document Reviewed: 08/19/2012 [facility] Patient Information 2015 [place], [place]. This information is not intended to replace advice given to you by your health care provider. Make sure you discuss any questions you have with your health care provider.

## 2014-07-04 ENCOUNTER — Other Ambulatory Visit: Payer: Self-pay | Admitting: Medical

## 2014-07-04 MED ORDER — AZITHROMYCIN 500 MG PO TABS: ORAL_TABLET | ORAL | Status: DC

## 2014-09-07 MED ORDER — OXYCODONE-ACETAMINOPHEN 5-325 MG PO TABS: 1.0000 | ORAL_TABLET | Freq: Four times a day (QID) | ORAL | Status: DC | PRN

## 2014-10-09 ENCOUNTER — Telehealth: Payer: Self-pay | Admitting: Family Medicine

## 2014-10-09 NOTE — Telephone Encounter (Signed)
Pt requested copy of vaccine record

## 2014-11-21 ENCOUNTER — Encounter: Payer: Self-pay | Admitting: *Deleted

## 2015-01-05 ENCOUNTER — Other Ambulatory Visit: Payer: Self-pay | Admitting: Medical

## 2015-01-05 MED ORDER — METRONIDAZOLE 500 MG PO TABS: 500.0000 mg | ORAL_TABLET | Freq: Three times a day (TID) | ORAL | Status: DC

## 2015-01-26 ENCOUNTER — Other Ambulatory Visit: Payer: Self-pay | Admitting: Medical

## 2015-01-26 MED ORDER — METRONIDAZOLE 500 MG PO TABS: 500.0000 mg | ORAL_TABLET | Freq: Three times a day (TID) | ORAL | Status: DC

## 2015-03-27 ENCOUNTER — Other Ambulatory Visit: Payer: Self-pay | Admitting: Medical

## 2015-03-27 MED ORDER — AZITHROMYCIN 250 MG PO TABS: ORAL_TABLET | ORAL | Status: DC

## 2015-03-27 MED FILL — AZITHROMYCIN 250 MG TABLET: 250 | 5 days supply | Qty: 6 | Fill #0

## 2015-04-04 ENCOUNTER — Other Ambulatory Visit: Payer: Self-pay | Admitting: Medical

## 2015-04-04 MED ORDER — AZITHROMYCIN 250 MG PO TABS: ORAL_TABLET | ORAL | Status: DC

## 2015-04-04 MED FILL — AZITHROMYCIN 250 MG TABLET: 250 | 5 days supply | Qty: 6 | Fill #0

## 2015-05-22 ENCOUNTER — Other Ambulatory Visit: Payer: 59

## 2015-05-22 DIAGNOSIS — Z23 Encounter for immunization: Secondary | ICD-10-CM

## 2015-06-18 ENCOUNTER — Other Ambulatory Visit: Payer: Self-pay | Admitting: Medical

## 2015-06-18 MED ORDER — NEOMYCIN-POLYMYXIN-HC 3.5-10000-1 OP SUSP: 3.0000 [drp] | Freq: Four times a day (QID) | OPHTHALMIC | Status: DC

## 2015-06-20 ENCOUNTER — Telehealth: Payer: Self-pay | Admitting: Medical

## 2015-06-20 MED ORDER — NEOMYCIN-POLYMYXIN-DEXAMETH 0.1 % OP SUSP: 1.0000 [drp] | Freq: Four times a day (QID) | OPHTHALMIC | Status: DC

## 2015-06-20 MED FILL — NEO/POLYMYXIN/DEXAMETH DROP: 3.5-10000-0 | 20 days supply | Qty: 5 | Fill #0

## 2015-06-20 NOTE — Telephone Encounter (Signed)
Called in per Shane  °

## 2015-07-30 ENCOUNTER — Telehealth: Payer: Self-pay | Admitting: Family Medicine

## 2015-07-30 NOTE — Telephone Encounter (Signed)
Mychart activation

## 2015-10-09 MED FILL — FINASTERIDE 5 MG TABLET: 5 | 90 days supply | Qty: 90 | Fill #0

## 2015-12-05 MED FILL — HYDROCODON-APAP 7.5-325: 7.5-325 | 3 days supply | Qty: 10 | Fill #0

## 2015-12-05 MED FILL — AMOXICILLIN 500 MG CAPSULE: 500 | 7 days supply | Qty: 28 | Fill #0

## 2015-12-13 ENCOUNTER — Other Ambulatory Visit: Payer: Self-pay | Admitting: Medical

## 2015-12-13 MED ORDER — CLINDAMYCIN HCL 300 MG PO CAPS: 300.0000 mg | ORAL_CAPSULE | Freq: Three times a day (TID) | ORAL | 0 refills | Status: DC

## 2016-03-20 DIAGNOSIS — H2513 Age-related nuclear cataract, bilateral: Secondary | ICD-10-CM | POA: Diagnosis not present

## 2016-03-20 DIAGNOSIS — H04123 Dry eye syndrome of bilateral lacrimal glands: Secondary | ICD-10-CM | POA: Diagnosis not present

## 2016-03-20 DIAGNOSIS — H40013 Open angle with borderline findings, low risk, bilateral: Secondary | ICD-10-CM | POA: Diagnosis not present

## 2016-03-20 DIAGNOSIS — E119 Type 2 diabetes mellitus without complications: Secondary | ICD-10-CM | POA: Diagnosis not present

## 2016-04-17 ENCOUNTER — Other Ambulatory Visit: Payer: Self-pay | Admitting: Medical

## 2016-04-17 MED ORDER — EXENATIDE ER 2 MG/0.85ML ~~LOC~~ AUIJ: 2.0000 mg | AUTO-INJECTOR | SUBCUTANEOUS | 2 refills | Status: DC

## 2016-04-17 NOTE — Progress Notes (Signed)
Already on metformin, begin trial of B-Cise for better control

## 2016-04-21 ENCOUNTER — Other Ambulatory Visit: Payer: Self-pay | Admitting: Medical

## 2016-05-22 ENCOUNTER — Telehealth: Payer: Self-pay | Admitting: Family Medicine

## 2016-05-22 NOTE — Telephone Encounter (Signed)
Pt request vaccine documentation to verify he is up to date.

## 2016-06-26 ENCOUNTER — Other Ambulatory Visit (INDEPENDENT_AMBULATORY_CARE_PROVIDER_SITE_OTHER): Payer: 59

## 2016-06-26 DIAGNOSIS — Z23 Encounter for immunization: Secondary | ICD-10-CM | POA: Diagnosis not present

## 2016-08-22 ENCOUNTER — Other Ambulatory Visit: Payer: Self-pay | Admitting: Family Medicine

## 2016-08-22 MED ORDER — ZOLPIDEM TARTRATE 5 MG PO TABS: 5.0000 mg | ORAL_TABLET | Freq: Every evening | ORAL | 1 refills | Status: DC | PRN

## 2016-08-22 MED FILL — ZOLPIDEM TARTRATE 5 MG TAB: 5 | 15 days supply | Qty: 15 | Fill #0

## 2016-08-22 NOTE — Progress Notes (Signed)
   Subjective:    Patient ID: Adam Ware, Dr., male    DOB: 08/29/1950, 66 y.o.   MRN: [id number]  HPI    Review of Systems     Objective:   Physical Exam        Assessment & Plan:   

## 2016-10-07 ENCOUNTER — Telehealth: Payer: Self-pay | Admitting: Family Medicine

## 2016-10-07 DIAGNOSIS — Z23 Encounter for immunization: Secondary | ICD-10-CM

## 2016-10-07 MED ORDER — FINASTERIDE 5 MG PO TABS: 5.0000 mg | ORAL_TABLET | Freq: Every day | ORAL | 3 refills | Status: DC

## 2016-10-07 MED FILL — FINASTERIDE 5 MG TABLET: 5 | 90 days supply | Qty: 90 | Fill #0

## 2016-10-07 NOTE — Telephone Encounter (Signed)
Patient wants to know when 2nd dose of Shingrix is due and if any other vaccines are due.

## 2016-10-09 ENCOUNTER — Telehealth: Payer: Self-pay | Admitting: Family Medicine

## 2016-10-09 ENCOUNTER — Other Ambulatory Visit (INDEPENDENT_AMBULATORY_CARE_PROVIDER_SITE_OTHER): Payer: 59

## 2016-10-09 DIAGNOSIS — Z23 Encounter for immunization: Secondary | ICD-10-CM

## 2016-10-09 MED ORDER — ESZOPICLONE 3 MG PO TABS: 3.0000 mg | ORAL_TABLET | Freq: Every day | ORAL | 0 refills | Status: DC

## 2016-10-09 MED FILL — ESZOPICLONE 3 MG TABLET: 3 | 7 days supply | Qty: 7 | Fill #0

## 2016-10-09 NOTE — Telephone Encounter (Signed)
Called in med to pharmacy  

## 2016-10-09 NOTE — Telephone Encounter (Signed)
Patient is having difficulty falling asleep and staying asleep. This is ongoing and he has tried a variety of medications for this. He has [place] that he takes very sparingly and requests to try Lunesta instead. He reports having tried Lunesta 3 mg in the past with no side effects.  We discussed using good sleep hygiene. He is aware of possible side effects and will report back any abnormal thoughts or behaviors while taking this medication.   Will call in Lunesta 3 mg #7 no refills.  He will let me know how is doing on the medication.

## 2016-11-07 ENCOUNTER — Encounter: Payer: Self-pay | Admitting: Family Medicine

## 2017-01-02 ENCOUNTER — Other Ambulatory Visit: Payer: Self-pay | Admitting: Family Medicine

## 2017-01-02 MED ORDER — ESZOPICLONE 3 MG PO TABS: 3.0000 mg | ORAL_TABLET | Freq: Every day | ORAL | 0 refills | Status: DC

## 2017-01-02 MED FILL — ESZOPICLONE 3 MG TABLET: 3 | 20 days supply | Qty: 20 | Fill #0

## 2017-01-02 NOTE — Progress Notes (Signed)
   Subjective:    Patient ID: Adam Ware, Dr., male    DOB: January 17, 1951, 66 y.o.   MRN: [id number]  HPI Patient requests refill of his sleep medication. He is doing well on this and no side effects.    Review of Systems     Objective:   Physical Exam        Assessment & Plan:

## 2017-01-27 ENCOUNTER — Other Ambulatory Visit: Payer: Self-pay | Admitting: Medical

## 2017-01-27 ENCOUNTER — Telehealth: Payer: Self-pay | Admitting: Medical

## 2017-01-27 MED ORDER — AZITHROMYCIN 500 MG PO TABS: 500.0000 mg | ORAL_TABLET | Freq: Every day | ORAL | 0 refills | Status: AC

## 2017-01-27 MED FILL — AZITHROMYCIN 500 MG TABLET: 500 | 3 days supply | Qty: 3 | Fill #0

## 2017-01-27 NOTE — Telephone Encounter (Signed)
I saw Dr. Redmond School today for 3 week hx/o cough, congestion, blood tinged mucous , post nasal drainage.   Called out Azithromycin.

## 2017-02-01 ENCOUNTER — Other Ambulatory Visit: Payer: Self-pay | Admitting: Medical

## 2017-02-01 MED ORDER — AZITHROMYCIN 500 MG PO TABS: ORAL_TABLET | ORAL | 0 refills | Status: DC

## 2017-04-06 ENCOUNTER — Other Ambulatory Visit: Payer: Self-pay

## 2017-04-06 ENCOUNTER — Ambulatory Visit (INDEPENDENT_AMBULATORY_CARE_PROVIDER_SITE_OTHER)
Admission: RE | Admit: 2017-04-06 | Discharge: 2017-04-06 | Disposition: A | Discharge status: Home / Self Care | Payer: PPO | Source: Ambulatory Visit | Attending: Family Medicine | Admitting: Family Medicine

## 2017-04-06 ENCOUNTER — Other Ambulatory Visit: Payer: Self-pay | Admitting: Family Medicine

## 2017-04-06 DIAGNOSIS — E7439 Other disorders of intestinal carbohydrate absorption: Secondary | ICD-10-CM

## 2017-04-06 DIAGNOSIS — Z833 Family history of diabetes mellitus: Secondary | ICD-10-CM

## 2017-04-06 DIAGNOSIS — Z23 Encounter for immunization: Secondary | ICD-10-CM

## 2017-04-06 DIAGNOSIS — Z96651 Presence of right artificial knee joint: Secondary | ICD-10-CM

## 2017-04-06 DIAGNOSIS — E785 Hyperlipidemia, unspecified: Secondary | ICD-10-CM

## 2017-04-06 DIAGNOSIS — M25461 Effusion, right knee: Secondary | ICD-10-CM | POA: Diagnosis not present

## 2017-04-06 NOTE — Progress Notes (Signed)
   Subjective:    Patient ID: Adam Ware, Dr., male    DOB: 1950/08/03, 67 y.o.   MRN: [id number]  HPI    Review of Systems     Objective:   Physical Exam        Assessment & Plan:

## 2017-04-07 ENCOUNTER — Other Ambulatory Visit (INDEPENDENT_AMBULATORY_CARE_PROVIDER_SITE_OTHER): Payer: PPO

## 2017-04-07 DIAGNOSIS — E785 Hyperlipidemia, unspecified: Secondary | ICD-10-CM | POA: Diagnosis not present

## 2017-04-07 DIAGNOSIS — E7439 Other disorders of intestinal carbohydrate absorption: Secondary | ICD-10-CM | POA: Diagnosis not present

## 2017-04-07 DIAGNOSIS — Z833 Family history of diabetes mellitus: Secondary | ICD-10-CM | POA: Diagnosis not present

## 2017-04-07 DIAGNOSIS — Z23 Encounter for immunization: Secondary | ICD-10-CM

## 2017-04-07 LAB — CBC WITH DIFFERENTIAL/PLATELET
Basophils Absolute: 0 10*3/uL (ref 0.0–0.2)
Basos: 0 %
EOS (ABSOLUTE): 0.2 10*3/uL (ref 0.0–0.4)
Eos: 2 %
Hematocrit: 42.4 % (ref 37.5–51.0)
Hemoglobin: 14.4 g/dL (ref 13.0–17.7)
Immature Grans (Abs): 0 10*3/uL (ref 0.0–0.1)
Immature Granulocytes: 0 %
Lymphocytes Absolute: 3 10*3/uL (ref 0.7–3.1)
Lymphs: 39 %
MCH: 28.5 pg (ref 26.6–33.0)
MCHC: 34 g/dL (ref 31.5–35.7)
MCV: 84 fL (ref 79–97)
Monocytes Absolute: 0.5 10*3/uL (ref 0.1–0.9)
Monocytes: 7 %
Neutrophils Absolute: 4 10*3/uL (ref 1.4–7.0)
Neutrophils: 52 %
Platelets: 245 10*3/uL (ref 150–379)
RBC: 5.05 x10E6/uL (ref 4.14–5.80)
RDW: 14 % (ref 12.3–15.4)
WBC: 7.6 10*3/uL (ref 3.4–10.8)

## 2017-04-07 LAB — COMPREHENSIVE METABOLIC PANEL
ALT: 35 IU/L (ref 0–44)
AST: 23 IU/L (ref 0–40)
Albumin/Globulin Ratio: 2.6 — ABNORMAL HIGH (ref 1.2–2.2)
Albumin: 4.4 g/dL (ref 3.6–4.8)
Alkaline Phosphatase: 104 IU/L (ref 39–117)
BUN/Creatinine Ratio: 19 (ref 10–24)
BUN: 16 mg/dL (ref 8–27)
Bilirubin Total: 0.7 mg/dL (ref 0.0–1.2)
CO2: 25 mmol/L (ref 20–29)
Calcium: 9.3 mg/dL (ref 8.6–10.2)
Chloride: 102 mmol/L (ref 96–106)
Creatinine, Ser: 0.84 mg/dL (ref 0.76–1.27)
GFR calc Af Amer: 104 mL/min/{1.73_m2} (ref >59)
GFR calc non Af Amer: 90 mL/min/{1.73_m2} (ref >59)
Globulin, Total: 1.7 g/dL (ref 1.5–4.5)
Glucose: 107 mg/dL — ABNORMAL HIGH (ref 65–99)
Potassium: 4.6 mmol/L (ref 3.5–5.2)
Sodium: 142 mmol/L (ref 134–144)
Total Protein: 6.1 g/dL (ref 6.0–8.5)

## 2017-04-07 LAB — LIPID PANEL
Chol/HDL Ratio: 4.7 ratio (ref 0.0–5.0)
Cholesterol, Total: 154 mg/dL (ref 100–199)
HDL: 33 mg/dL — ABNORMAL LOW (ref >39)
LDL Calculated: 63 mg/dL (ref 0–99)
Triglycerides: 291 mg/dL — ABNORMAL HIGH (ref 0–149)
VLDL Cholesterol Cal: 58 mg/dL — ABNORMAL HIGH (ref 5–40)

## 2017-04-08 ENCOUNTER — Ambulatory Visit (INDEPENDENT_AMBULATORY_CARE_PROVIDER_SITE_OTHER): Payer: PPO | Admitting: Family Medicine

## 2017-04-08 ENCOUNTER — Encounter (INDEPENDENT_AMBULATORY_CARE_PROVIDER_SITE_OTHER): Payer: Self-pay | Admitting: Orthopedic Surgery

## 2017-04-08 ENCOUNTER — Encounter: Payer: Self-pay | Admitting: Family Medicine

## 2017-04-08 ENCOUNTER — Ambulatory Visit (INDEPENDENT_AMBULATORY_CARE_PROVIDER_SITE_OTHER): Payer: 59 | Admitting: Orthopedic Surgery

## 2017-04-08 VITALS — Ht 73.0 in | Wt 214.0 lb

## 2017-04-08 VITALS — BP 140/88 | HR 72 | Temp 97.8°F | Ht 73.0 in | Wt 214.0 lb

## 2017-04-08 DIAGNOSIS — M2042 Other hammer toe(s) (acquired), left foot: Secondary | ICD-10-CM

## 2017-04-08 DIAGNOSIS — Z96659 Presence of unspecified artificial knee joint: Secondary | ICD-10-CM

## 2017-04-08 DIAGNOSIS — T84018A Broken internal joint prosthesis, other site, initial encounter: Secondary | ICD-10-CM | POA: Diagnosis not present

## 2017-04-08 DIAGNOSIS — M25561 Pain in right knee: Secondary | ICD-10-CM

## 2017-04-08 MED ORDER — ESZOPICLONE 2 MG PO TABS: 2.0000 mg | ORAL_TABLET | Freq: Every evening | ORAL | 5 refills | Status: DC | PRN

## 2017-04-08 MED ORDER — FINASTERIDE 5 MG PO TABS: 5.0000 mg | ORAL_TABLET | Freq: Every day | ORAL | 4 refills | Status: DC

## 2017-04-08 MED ORDER — ZOLPIDEM TARTRATE 5 MG PO TABS: 5.0000 mg | ORAL_TABLET | Freq: Every evening | ORAL | 5 refills | Status: DC | PRN

## 2017-04-08 MED ORDER — ATORVASTATIN CALCIUM 10 MG PO TABS: 10.0000 mg | ORAL_TABLET | ORAL | 3 refills | Status: DC

## 2017-04-08 MED FILL — ESZOPICLONE 2 MG TAB: 2 | 30 days supply | Qty: 30 | Fill #0

## 2017-04-08 MED FILL — FINASTERIDE 5 MG TABLET: 5 | 90 days supply | Qty: 90 | Fill #0

## 2017-04-08 MED FILL — ATORVASTATIN 10 MG TABLET: 10 | 84 days supply | Qty: 36 | Fill #0

## 2017-04-08 NOTE — Progress Notes (Signed)
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 A1c 6 months ago was 6.0%  He takes Lipitor 10 mg 3 times weekly because of a history of hyperlipidemia. LDL 49  He uses finasteride 5 mg daily  He also uses Lunesta or Ambien on a when necessary basis for sleep dysfunction  15 years ago he had a right knee replacement by Dr. Sharol Given. About a month ago it began hurting. No history of trauma. Now is having difficulty walking and episodes of severe pain. X-ray a couple days ago showed the hardware to be intact with a small effusion. He states he effusion is been present for a long period of time. He is concerned the hardware is wearing out he has an appointment to see Dr. Sharol Given today at 1:00.  In 2016 and episodes diverticulitis. Treated by GI recovered with no sequelae. He had a colonoscopy 2011.  In 2017 Dr. Minus Liberty blasted a stone. 1 cm in diameter. Presented is asymptomatic hematuria  He's also having difficulty with the second and third toe on his right foot. 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 Now is developing a hammertoe. We discussed various options. We elected to begin with podiatry.  Vaccinations up-to-date he said the new shingles vaccine 2.  He does have advanced directive signed and documented.  BP 140/88 (BP Location: Left Arm, Patient Position: Sitting, Cuff Size: Normal)   Pulse 72   Temp 97.8 F (36.6 C) (Oral)   Ht 6\' 1"  (1.854 m)   Wt 214 lb (97.1 kg)   BMI 28.23 kg/m  Well-developed well-nourished male no acute distress vital signs stable he is afebrile except his blood pressure slightly elevated. No family history of hypertension.  The left foot shows evidence of a hammertoe second toe.  Swelling right knee as noted above.  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 consult with podiatry  #3 hyperlipidemia......... continue Lipitor 10 mg 3 times weekly  #4 diabetes......... continue metformin 500 twice a day..... Return for CPX  #5 sleep dysfunction.........Marland Kitchen refill either Ambien or Lunesta for patient taken a when necessary basis  #6 hair loss.............. continue finasteride

## 2017-04-08 NOTE — Progress Notes (Signed)
Office Visit Note   Patient: Adam Ware, Dr.           Date of Birth: 1950/10/13           MRN: [id number] Visit Date: 04/08/2017              Requested by: Dorena Cookey, MD [place], [place] [id number] PCP: Dorena Cookey, MD  Chief Complaint  Patient presents with  . Right Knee - Pain, Edema    S/p right TKA approx. 15 years ago.      HPI: Patient is a 67 year old gentleman who presents with a one-month history of right knee pain his pain gets worse the longer he walks he states he cannot even walk 300 yards at this time he states he is used to walking without problems.  Patient is 15 years status post right total knee arthroplasty.  He denies any popping or locking or grinding.  He states that he is starting to get some buttocks pain occasionally denies any radicular symptoms denies any back pain.  Assessment & Plan: Visit Diagnoses:  1. Failed total knee arthroplasty, initial encounter [facility])     Plan: Examination shows failure of the polyethylene liner.  The radiographs do not show any loosening around the metal implants.  Will plan for revision of the polyethylene tray.  Discussed that we may need to revise the total knee if any of the components are loose.  Risks and benefits were discussed patient states he understands wished to proceed at this time.  Due to his pain with limited activities with daily living we will try to proceed as soon as possible.  Follow-Up Instructions: Return in about 3 weeks (around 04/29/2017).   Ortho Exam  Patient is alert, oriented, no adenopathy, well-dressed, normal affect, normal respiratory effort. Examination patient has an antalgic gait.  He does have an effusion in the suprapatellar pouch.  He does have laxity with varus and valgus stress consistent with loss of the polyethylene liner height.  There is no redness no cellulitis no open wounds no signs of infection.  Review of the radiographs shows decreased height  of the polyethylene tray consistent with polyethylene wear.  There is no lytic changes radiographically.  Imaging: No results found. No images are attached to the encounter.  Labs: Lab Results  Component Value Date   GRAMSTAIN No WBC Seen 10/21/2012   GRAMSTAIN No Squamous Epithelial Cells Seen 10/21/2012   GRAMSTAIN No Organisms Seen 10/21/2012   LABORGA Multiple Organisms Present,None Predominant 10/21/2012    @LABSALLVALUES (HGBA1)@  Body mass index is 28.23 kg/m.  Orders:  No orders of the defined types were placed in this encounter.  No orders of the defined types were placed in this encounter.    Procedures: No procedures performed  Clinical Data: No additional findings.  ROS:  All other systems negative, except as noted in the HPI. Review of Systems  Objective: Vital Signs: Ht 6\' 1"  (1.854 m)   Wt 214 lb (97.1 kg)   BMI 28.23 kg/m   Specialty Comments:  No specialty comments available.  PMFS History: There are no active problems to display for this patient.  Past Medical History:  Diagnosis Date  . Arthritis   . Hyperlipidemia     Family History  Problem Relation Age of Onset  . Cancer Mother   . Diabetes Mother   . Mental illness Mother   . Heart disease Father   . Arthritis Brother  Past Surgical History:  Procedure Laterality Date  . APPENDECTOMY    . JOINT REPLACEMENT    . REPLACEMENT TOTAL KNEE Right    Social History   Occupational History  . Not on file  Tobacco Use  . Smoking status: Never Smoker  . Smokeless tobacco: Never Used  Substance and Sexual Activity  . Alcohol use: Yes  . Drug use: No  . Sexual activity: Not on file

## 2017-04-08 NOTE — Patient Instructions (Addendum)
Set up a 30 minute appointment in 2 weeks at 41 AM for CPX  See Dr. do not today at 1:00 for evaluation of your right knee pain  We'll set you up a consult and podiatry for evaluation of your pain in the right foot.  I wrote and presented prescriptions for Ambien and/or let Lunesta. See which one he would prefer and throw the other prescription away

## 2017-04-13 ENCOUNTER — Other Ambulatory Visit (INDEPENDENT_AMBULATORY_CARE_PROVIDER_SITE_OTHER): Payer: Self-pay | Admitting: Orthopedic Surgery

## 2017-04-13 DIAGNOSIS — Z969 Presence of functional implant, unspecified: Secondary | ICD-10-CM

## 2017-04-14 ENCOUNTER — Telehealth: Payer: Self-pay | Admitting: Internal Medicine

## 2017-04-14 DIAGNOSIS — M25551 Pain in right hip: Secondary | ICD-10-CM | POA: Diagnosis not present

## 2017-04-14 NOTE — Telephone Encounter (Signed)
Pt has an appointment with [facility] today at 11:15am for spasms. I have faxed over demographics and insurance card over to them

## 2017-04-15 ENCOUNTER — Other Ambulatory Visit: Payer: Self-pay | Admitting: Family Medicine

## 2017-04-15 DIAGNOSIS — M25551 Pain in right hip: Secondary | ICD-10-CM | POA: Diagnosis not present

## 2017-04-15 MED ORDER — TIZANIDINE HCL 2 MG PO CAPS: ORAL_CAPSULE | ORAL | 2 refills | Status: DC

## 2017-04-15 MED FILL — tiZANidine HCL 2 MG TABS: 2 | 17 days supply | Qty: 50 | Fill #0

## 2017-04-16 DIAGNOSIS — M25551 Pain in right hip: Secondary | ICD-10-CM | POA: Diagnosis not present

## 2017-04-17 NOTE — Pre-Procedure Instructions (Signed)
Adam Ware, Dr.  04/17/2017      [place], [place] - [address] [facility] [address] [place] [address] Phone: [phone] Fax: [phone]  [facility] [place], [place] AT [place] [address] Phone: [phone] Fax: [phone]  [facility] [address] [place], [place] [address] Phone: [phone] Fax: [phone]    Your procedure is scheduled on March 6  Report to [place] at [place].M.  Call this number if you have problems the morning of surgery:  [phone]   Remember:  Do not eat food or drink liquids after midnight.  Continue all medications as directed by your physician except follow these medication instructions before surgery below   Take these medicines the morning of surgery with A SIP OF WATER  finasteride (PROSCAR)  7 days prior to surgery STOP taking any Aspirin(unless otherwise instructed by your surgeon), Aleve, Naproxen, Ibuprofen, Motrin, Advil, Goody's, BC's, all herbal medications, fish oil, and all vitamins   WHAT DO I DO ABOUT MY DIABETES MEDICATION?   Marland Kitchen Do not take oral diabetes medicines (pills) the morning of surgery. metFORMIN (GLUCOPHAGE)    How to Manage Your Diabetes Before and After Surgery  Why is it important to control my blood sugar before and after surgery? . Improving blood sugar levels before and after surgery helps healing and can limit problems. . A way of improving blood sugar control is eating a healthy diet by: o  Eating less sugar and carbohydrates o  Increasing activity/exercise o  Talking with your doctor about reaching your blood sugar goals . High blood sugars (greater than 180 mg/dL) can raise your risk of infections and slow your recovery, so you will need to focus on  controlling your diabetes during the weeks before surgery. . Make sure that the doctor who takes care of your diabetes knows about your planned surgery including the date and location.  How do I manage my blood sugar before surgery? . Check your blood sugar at least 4 times a day, starting 2 days before surgery, to make sure that the level is not too high or low. o Check your blood sugar the morning of your surgery when you wake up and every 2 hours until you get to the Short Stay unit. . If your blood sugar is less than 70 mg/dL, you will need to treat for low blood sugar: o Do not take insulin. o Treat a low blood sugar (less than 70 mg/dL) with  cup of clear juice (cranberry or apple), 4 glucose tablets, OR glucose gel. o Recheck blood sugar in 15 minutes after treatment (to make sure it is greater than 70 mg/dL). If your blood sugar is not greater than 70 mg/dL on recheck, call [phone] for further instructions. . Report your blood sugar to the short stay nurse when you get to Short Stay.  . If you are admitted to the hospital after surgery: o Your blood sugar will be checked by the staff and you will probably be given insulin after surgery (instead of oral diabetes medicines) to make sure you have good blood sugar levels. o The goal for blood sugar control after surgery is 80-180 mg/dL.     Do not wear jewelry  Do not wear lotions, powders, or cologne, or deodorant.  Men may shave face and neck.  Do not bring valuables to the hospital.  [facility] is not responsible for any belongings or valuables.  Contacts, dentures or bridgework may not be worn into surgery.  Leave your suitcase in the car.  After surgery it may be brought to your room.  For patients admitted to the hospital, discharge time will be determined by your treatment team.  Patients discharged the day of surgery will not be allowed to drive home.    Special instructions:   [facility]- Preparing For  Surgery  Before surgery, you can play an important role. Because skin is not sterile, your skin needs to be as free of germs as possible. You can reduce the number of germs on your skin by washing with CHG (chlorahexidine gluconate) Soap before surgery.  CHG is an antiseptic cleaner which kills germs and bonds with the skin to continue killing germs even after washing.  Please do not use if you have an allergy to CHG or antibacterial soaps. If your skin becomes reddened/irritated stop using the CHG.  Do not shave (including legs and underarms) for at least 48 hours prior to first CHG shower. It is OK to shave your face.  Please follow these instructions carefully.   1. Shower the NIGHT BEFORE SURGERY and the MORNING OF SURGERY with CHG.   2. If you chose to wash your hair, wash your hair first as usual with your normal shampoo.  3. After you shampoo, rinse your hair and body thoroughly to remove the shampoo.  4. Use CHG as you would any other liquid soap. You can apply CHG directly to the skin and wash gently with a scrungie or a clean washcloth.   5. Apply the CHG Soap to your body ONLY FROM THE NECK DOWN.  Do not use on open wounds or open sores. Avoid contact with your eyes, ears, mouth and genitals (private parts). Wash Face and genitals (private parts)  with your normal soap.  6. Wash thoroughly, paying special attention to the area where your surgery will be performed.  7. Thoroughly rinse your body with warm water from the neck down.  8. DO NOT shower/wash with your normal soap after using and rinsing off the CHG Soap.  9. Pat yourself dry with a CLEAN TOWEL.  10. Wear CLEAN PAJAMAS to bed the night before surgery, wear comfortable clothes the morning of surgery  11. Place CLEAN SHEETS on your bed the night of your first shower and DO NOT SLEEP WITH PETS.    Day of Surgery: Do not apply any deodorants/lotions. Please wear clean clothes to the hospital/surgery center.       Please read over the following fact sheets that you were given.

## 2017-04-20 ENCOUNTER — Other Ambulatory Visit: Payer: Self-pay

## 2017-04-20 ENCOUNTER — Encounter (HOSPITAL_COMMUNITY)
Admission: RE | Admit: 2017-04-20 | Discharge: 2017-04-20 | Disposition: A | Discharge status: Home / Self Care | Payer: PPO | Source: Ambulatory Visit | Attending: Orthopedic Surgery | Admitting: Orthopedic Surgery

## 2017-04-20 ENCOUNTER — Encounter (HOSPITAL_COMMUNITY): Payer: Self-pay

## 2017-04-20 ENCOUNTER — Other Ambulatory Visit (INDEPENDENT_AMBULATORY_CARE_PROVIDER_SITE_OTHER): Payer: PPO | Admitting: Family Medicine

## 2017-04-20 DIAGNOSIS — M65861 Other synovitis and tenosynovitis, right lower leg: Secondary | ICD-10-CM | POA: Diagnosis present

## 2017-04-20 DIAGNOSIS — G8918 Other acute postprocedural pain: Secondary | ICD-10-CM | POA: Diagnosis not present

## 2017-04-20 DIAGNOSIS — M545 Low back pain: Secondary | ICD-10-CM | POA: Diagnosis not present

## 2017-04-20 DIAGNOSIS — E119 Type 2 diabetes mellitus without complications: Secondary | ICD-10-CM | POA: Diagnosis present

## 2017-04-20 DIAGNOSIS — M4316 Spondylolisthesis, lumbar region: Secondary | ICD-10-CM | POA: Diagnosis not present

## 2017-04-20 DIAGNOSIS — T84012S Broken internal right knee prosthesis, sequela: Secondary | ICD-10-CM | POA: Diagnosis not present

## 2017-04-20 DIAGNOSIS — Z833 Family history of diabetes mellitus: Secondary | ICD-10-CM | POA: Diagnosis not present

## 2017-04-20 DIAGNOSIS — Z8261 Family history of arthritis: Secondary | ICD-10-CM | POA: Diagnosis not present

## 2017-04-20 DIAGNOSIS — Z87442 Personal history of urinary calculi: Secondary | ICD-10-CM | POA: Diagnosis not present

## 2017-04-20 DIAGNOSIS — Y838 Other surgical procedures as the cause of abnormal reaction of the patient, or of later complication, without mention of misadventure at the time of the procedure: Secondary | ICD-10-CM | POA: Diagnosis present

## 2017-04-20 DIAGNOSIS — L03115 Cellulitis of right lower limb: Secondary | ICD-10-CM | POA: Diagnosis present

## 2017-04-20 DIAGNOSIS — T84092A Other mechanical complication of internal right knee prosthesis, initial encounter: Secondary | ICD-10-CM | POA: Diagnosis present

## 2017-04-20 DIAGNOSIS — T84062A Wear of articular bearing surface of internal prosthetic right knee joint, initial encounter: Secondary | ICD-10-CM | POA: Diagnosis present

## 2017-04-20 DIAGNOSIS — Z8249 Family history of ischemic heart disease and other diseases of the circulatory system: Secondary | ICD-10-CM | POA: Diagnosis not present

## 2017-04-20 DIAGNOSIS — Z01818 Encounter for other preprocedural examination: Secondary | ICD-10-CM

## 2017-04-20 DIAGNOSIS — E785 Hyperlipidemia, unspecified: Secondary | ICD-10-CM | POA: Diagnosis present

## 2017-04-20 DIAGNOSIS — Z7984 Long term (current) use of oral hypoglycemic drugs: Secondary | ICD-10-CM | POA: Diagnosis not present

## 2017-04-20 DIAGNOSIS — M25551 Pain in right hip: Secondary | ICD-10-CM | POA: Diagnosis not present

## 2017-04-20 HISTORY — DX: Type 2 diabetes mellitus without complications: E11.9

## 2017-04-20 HISTORY — DX: Personal history of urinary calculi: Z87.442

## 2017-04-20 HISTORY — DX: Nonscarring hair loss, unspecified: L65.9

## 2017-04-20 LAB — COMPREHENSIVE METABOLIC PANEL
ALT: 34 U/L (ref 17–63)
AST: 22 U/L (ref 15–41)
Albumin: 3.9 g/dL (ref 3.5–5.0)
Alkaline Phosphatase: 83 U/L (ref 38–126)
Anion gap: 11 (ref 5–15)
BUN: 16 mg/dL (ref 6–20)
CO2: 22 mmol/L (ref 22–32)
Calcium: 9.4 mg/dL (ref 8.9–10.3)
Chloride: 108 mmol/L (ref 101–111)
Creatinine, Ser: 0.9 mg/dL (ref 0.61–1.24)
GFR calc Af Amer: >60 mL/min (ref >60)
GFR calc non Af Amer: >60 mL/min (ref >60)
Glucose, Bld: 140 mg/dL — ABNORMAL HIGH (ref 65–99)
Potassium: 4.2 mmol/L (ref 3.5–5.1)
Sodium: 141 mmol/L (ref 135–145)
Total Bilirubin: 0.8 mg/dL (ref 0.3–1.2)
Total Protein: 6.1 g/dL — ABNORMAL LOW (ref 6.5–8.1)

## 2017-04-20 LAB — HEMOGLOBIN A1C
Hgb A1c MFr Bld: 6.2 % — ABNORMAL HIGH (ref 4.8–5.6)
Mean Plasma Glucose: 131.24 mg/dL

## 2017-04-20 LAB — SURGICAL PCR SCREEN
MRSA, PCR: NEGATIVE
Staphylococcus aureus: POSITIVE — AB

## 2017-04-20 LAB — TYPE AND SCREEN
ABO/RH(D): O POS
Antibody Screen: NEGATIVE

## 2017-04-20 LAB — CBC
HCT: 45.7 % (ref 39.0–52.0)
Hemoglobin: 14.9 g/dL (ref 13.0–17.0)
MCH: 28.7 pg (ref 26.0–34.0)
MCHC: 32.6 g/dL (ref 30.0–36.0)
MCV: 88.1 fL (ref 78.0–100.0)
Platelets: 239 10*3/uL (ref 150–400)
RBC: 5.19 MIL/uL (ref 4.22–5.81)
RDW: 13.5 % (ref 11.5–15.5)
WBC: 8.6 10*3/uL (ref 4.0–10.5)

## 2017-04-20 LAB — PROTIME-INR
INR: 1.05
Prothrombin Time: 13.6 seconds (ref 11.4–15.2)

## 2017-04-20 LAB — ABO/RH: ABO/RH(D): O POS

## 2017-04-20 LAB — APTT: aPTT: 28 seconds (ref 24–36)

## 2017-04-20 LAB — GLUCOSE, CAPILLARY: Glucose-Capillary: 171 mg/dL — ABNORMAL HIGH (ref 65–99)

## 2017-04-20 NOTE — Progress Notes (Signed)
PCP: Dr. Sherren Mocha Cardiologist: Denies  EKG: Today at primary office  Fasting Blood Sugar- 120's Checks Blood Sugar intermittently, once a week  Patient denies shortness of breath, fever, cough, and chest pain at PAT appointment.  Patient verbalized understanding of instructions provided today at the PAT appointment.  Patient asked to review instructions at home and day of surgery.

## 2017-04-21 ENCOUNTER — Ambulatory Visit (INDEPENDENT_AMBULATORY_CARE_PROVIDER_SITE_OTHER): Payer: PPO

## 2017-04-21 ENCOUNTER — Ambulatory Visit (INDEPENDENT_AMBULATORY_CARE_PROVIDER_SITE_OTHER): Payer: PPO | Admitting: Orthopedic Surgery

## 2017-04-21 ENCOUNTER — Encounter (INDEPENDENT_AMBULATORY_CARE_PROVIDER_SITE_OTHER): Payer: Self-pay | Admitting: Orthopedic Surgery

## 2017-04-21 DIAGNOSIS — M545 Low back pain: Secondary | ICD-10-CM | POA: Diagnosis not present

## 2017-04-21 DIAGNOSIS — M4316 Spondylolisthesis, lumbar region: Secondary | ICD-10-CM

## 2017-04-21 DIAGNOSIS — M25551 Pain in right hip: Secondary | ICD-10-CM | POA: Diagnosis not present

## 2017-04-21 MED ORDER — CEFAZOLIN SODIUM-DEXTROSE 2-4 GM/100ML-% IV SOLN
2.0000 g | INTRAVENOUS | Status: AC
  Administered 2017-04-22: 2 g via INTRAVENOUS
  Filled 2017-04-21: qty 100

## 2017-04-21 NOTE — Progress Notes (Signed)
Office Visit Note   Patient: Adam Ware, Dr.           Date of Birth: 1950-08-22           MRN: [id number] Visit Date: 04/21/2017              Requested by: Dorena Cookey, MD [place], [place] [address] PCP: Dorena Cookey, MD  Chief Complaint  Patient presents with  . Right Hip - Pain  . Lower Back - Pain      HPI: Patient is a 67 year old gentleman who presents with right buttocks pain.  Patient states he has been unable to ambulate or work secondary to his hip pain.  States he occasionally has some radicular pain down to his foot denies any lower back pain.  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  Assessment & Plan: Visit Diagnoses:  1. Acute low back pain, unspecified back pain laterality, with sciatica presence unspecified   2. Pain in right hip   3. Spondylolisthesis, lumbar region     Plan: Discussed with patient that the buttocks pain is most likely from his grade 1 spondylolisthesis.  Will have him work on hamstring stretching use heat on the lumbar spine.  Discussed that after he is status post revision of the polyethylene for the right total knee we could consider low-dose prednisone or most likely will need to obtain an MRI scan of his lumbar spine to consider the possibility of a transforaminal injection.  Follow-Up Instructions: Return in about 2 weeks (around 05/05/2017).   Ortho Exam  Patient is alert, oriented, no adenopathy, well-dressed, normal affect, normal respiratory effort. Examination patient has a normal gait.  He has significant hamstring contracture on the right compared to the left with a left leg that is 20 degrees short of full extension with his hip flexed and on the right lower extremity he lacks about 45 degrees of extension with the hip flexed.  Patient has a negative straight leg raise.  There is minimal pain and good range of motion with the right hip no  intra-articular hip pathology.  Imaging: Xr Hip Unilat W Or W/o Pelvis 2-3 Views Right  Result Date: 04/21/2017 2 view radiographs of the right hip shows good maintenance of the joint space there is very small bony spur inferiorly.  There is no subcondylar cysts no subcondylar sclerosis.  Xr Lumbar Spine 2-3 Views  Result Date: 04/21/2017 2 view radiographs of the lumbar spine shows calcification of the aorta without aneurysm.  Maximum diameter 22 mm.  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  No images are attached to the encounter.  Labs: Lab Results  Component Value Date   HGBA1C 6.2 (H) 04/20/2017   GRAMSTAIN No WBC Seen 10/21/2012   GRAMSTAIN No Squamous Epithelial Cells Seen 10/21/2012   GRAMSTAIN No Organisms Seen 10/21/2012   LABORGA Multiple Organisms Present,None Predominant 10/21/2012    @LABSALLVALUES (HGBA1)@  There is no height or weight on file to calculate BMI.  Orders:  Orders Placed This Encounter  Procedures  . XR Lumbar Spine 2-3 Views  . XR HIP UNILAT W OR W/O PELVIS 2-3 VIEWS RIGHT   No orders of the defined types were placed in this encounter.    Procedures: No procedures performed  Clinical Data: No additional findings.  ROS:  All other systems negative, except as noted in the HPI. Review of Systems  Objective: Vital Signs: There were no vitals taken for this visit.  Specialty Comments:  No specialty comments available.  PMFS History: Patient Active Problem List   Diagnosis Date Noted  . Spondylolisthesis, lumbar region 04/21/2017   Past Medical History:  Diagnosis Date  . Arthritis   . Diabetes mellitus without complication ([place])   . Hair loss   . History of kidney stones   . Hyperlipidemia     Family History  Problem Relation Age of  Onset  . Cancer Mother   . Diabetes Mother   . Mental illness Mother   . Heart disease Father   . Arthritis Brother     Past Surgical History:  Procedure Laterality Date  . APPENDECTOMY    . JOINT REPLACEMENT    . LITHOTRIPSY    . REPLACEMENT TOTAL KNEE Right    Social History   Occupational History  . Not on file  Tobacco Use  . Smoking status: Never Smoker  . Smokeless tobacco: Never Used  Substance and Sexual Activity  . Alcohol use: Yes  . Drug use: No  . Sexual activity: Not on file

## 2017-04-22 ENCOUNTER — Encounter (HOSPITAL_COMMUNITY): Payer: Self-pay

## 2017-04-22 ENCOUNTER — Inpatient Hospital Stay (HOSPITAL_COMMUNITY): Payer: PPO | Admitting: Anesthesiology

## 2017-04-22 ENCOUNTER — Encounter (HOSPITAL_COMMUNITY)
Admission: RE | Disposition: A | Discharge status: Home / Self Care | Payer: Self-pay | Source: Ambulatory Visit | Attending: Orthopedic Surgery

## 2017-04-22 ENCOUNTER — Inpatient Hospital Stay (HOSPITAL_COMMUNITY)
Admission: RE | Admit: 2017-04-22 | Discharge: 2017-04-23 | DRG: 488 | Disposition: A | Discharge status: Home / Self Care | Payer: PPO | Source: Ambulatory Visit | Attending: Orthopedic Surgery | Admitting: Orthopedic Surgery

## 2017-04-22 DIAGNOSIS — M65861 Other synovitis and tenosynovitis, right lower leg: Secondary | ICD-10-CM | POA: Diagnosis present

## 2017-04-22 DIAGNOSIS — Z96651 Presence of right artificial knee joint: Secondary | ICD-10-CM

## 2017-04-22 DIAGNOSIS — Z87442 Personal history of urinary calculi: Secondary | ICD-10-CM | POA: Diagnosis not present

## 2017-04-22 DIAGNOSIS — Z833 Family history of diabetes mellitus: Secondary | ICD-10-CM

## 2017-04-22 DIAGNOSIS — L03115 Cellulitis of right lower limb: Secondary | ICD-10-CM | POA: Diagnosis present

## 2017-04-22 DIAGNOSIS — T84062A Wear of articular bearing surface of internal prosthetic right knee joint, initial encounter: Secondary | ICD-10-CM | POA: Diagnosis present

## 2017-04-22 DIAGNOSIS — Z8249 Family history of ischemic heart disease and other diseases of the circulatory system: Secondary | ICD-10-CM

## 2017-04-22 DIAGNOSIS — Z8261 Family history of arthritis: Secondary | ICD-10-CM | POA: Diagnosis not present

## 2017-04-22 DIAGNOSIS — Z7984 Long term (current) use of oral hypoglycemic drugs: Secondary | ICD-10-CM | POA: Diagnosis not present

## 2017-04-22 DIAGNOSIS — E119 Type 2 diabetes mellitus without complications: Secondary | ICD-10-CM | POA: Diagnosis present

## 2017-04-22 DIAGNOSIS — T84012S Broken internal right knee prosthesis, sequela: Secondary | ICD-10-CM | POA: Diagnosis not present

## 2017-04-22 DIAGNOSIS — Z969 Presence of functional implant, unspecified: Secondary | ICD-10-CM

## 2017-04-22 DIAGNOSIS — Y838 Other surgical procedures as the cause of abnormal reaction of the patient, or of later complication, without mention of misadventure at the time of the procedure: Secondary | ICD-10-CM | POA: Diagnosis present

## 2017-04-22 DIAGNOSIS — E785 Hyperlipidemia, unspecified: Secondary | ICD-10-CM | POA: Diagnosis present

## 2017-04-22 DIAGNOSIS — T84092A Other mechanical complication of internal right knee prosthesis, initial encounter: Secondary | ICD-10-CM | POA: Diagnosis present

## 2017-04-22 HISTORY — PX: TOTAL KNEE REVISION: SHX996

## 2017-04-22 LAB — GLUCOSE, CAPILLARY
Glucose-Capillary: 138 mg/dL — ABNORMAL HIGH (ref 65–99)
Glucose-Capillary: 142 mg/dL — ABNORMAL HIGH (ref 65–99)
Glucose-Capillary: 211 mg/dL — ABNORMAL HIGH (ref 65–99)
Glucose-Capillary: 216 mg/dL — ABNORMAL HIGH (ref 65–99)

## 2017-04-22 SURGERY — TOTAL KNEE REVISION
Anesthesia: Spinal | Laterality: Right

## 2017-04-22 MED ORDER — KETOROLAC TROMETHAMINE 15 MG/ML IJ SOLN
15.0000 mg | Freq: Three times a day (TID) | INTRAMUSCULAR | Status: DC
  Administered 2017-04-22 – 2017-04-23 (×3): 15 mg via INTRAVENOUS
  Filled 2017-04-22 (×2): qty 1

## 2017-04-22 MED ORDER — INSULIN ASPART 100 UNIT/ML ~~LOC~~ SOLN: 0.0000 [IU] | Freq: Every day | SUBCUTANEOUS | Status: DC

## 2017-04-22 MED ORDER — ONDANSETRON HCL 4 MG/2ML IJ SOLN: 4.0000 mg | Freq: Four times a day (QID) | INTRAMUSCULAR | Status: DC | PRN

## 2017-04-22 MED ORDER — FENTANYL CITRATE (PF) 250 MCG/5ML IJ SOLN
INTRAMUSCULAR | Status: DC | PRN
  Administered 2017-04-22 (×2): 50 ug via INTRAVENOUS

## 2017-04-22 MED ORDER — ONDANSETRON HCL 4 MG PO TABS: 4.0000 mg | ORAL_TABLET | Freq: Four times a day (QID) | ORAL | Status: DC | PRN

## 2017-04-22 MED ORDER — FENTANYL CITRATE (PF) 100 MCG/2ML IJ SOLN
INTRAMUSCULAR | Status: AC
  Filled 2017-04-22: qty 2

## 2017-04-22 MED ORDER — FENTANYL CITRATE (PF) 250 MCG/5ML IJ SOLN
INTRAMUSCULAR | Status: AC
  Filled 2017-04-22: qty 5

## 2017-04-22 MED ORDER — PROPOFOL 10 MG/ML IV BOLUS
INTRAVENOUS | Status: DC | PRN
  Administered 2017-04-22 (×5): 20 mg via INTRAVENOUS

## 2017-04-22 MED ORDER — KETOROLAC TROMETHAMINE 15 MG/ML IJ SOLN
INTRAMUSCULAR | Status: AC
  Filled 2017-04-22: qty 1

## 2017-04-22 MED ORDER — FINASTERIDE 5 MG PO TABS
5.0000 mg | ORAL_TABLET | Freq: Every day | ORAL | Status: DC
  Administered 2017-04-22: 2.5 mg via ORAL
  Administered 2017-04-23: 5 mg via ORAL
  Filled 2017-04-22 (×2): qty 1

## 2017-04-22 MED ORDER — MIDAZOLAM HCL 2 MG/2ML IJ SOLN
INTRAMUSCULAR | Status: DC | PRN
  Administered 2017-04-22 (×2): 1 mg via INTRAVENOUS

## 2017-04-22 MED ORDER — OXYCODONE HCL 5 MG/5ML PO SOLN: 5.0000 mg | Freq: Once | ORAL | Status: DC | PRN

## 2017-04-22 MED ORDER — ZOLPIDEM TARTRATE 5 MG PO TABS
5.0000 mg | ORAL_TABLET | Freq: Every evening | ORAL | Status: DC | PRN
  Administered 2017-04-22: 5 mg via ORAL
  Filled 2017-04-22: qty 1

## 2017-04-22 MED ORDER — METOCLOPRAMIDE HCL 5 MG PO TABS: 5.0000 mg | ORAL_TABLET | Freq: Three times a day (TID) | ORAL | Status: DC | PRN

## 2017-04-22 MED ORDER — PROPOFOL 10 MG/ML IV BOLUS
INTRAVENOUS | Status: AC
  Filled 2017-04-22: qty 20

## 2017-04-22 MED ORDER — MIDAZOLAM HCL 2 MG/2ML IJ SOLN
INTRAMUSCULAR | Status: AC
  Administered 2017-04-22: 2 mg via INTRAVENOUS
  Filled 2017-04-22: qty 2

## 2017-04-22 MED ORDER — DEXAMETHASONE SODIUM PHOSPHATE 10 MG/ML IJ SOLN
INTRAMUSCULAR | Status: DC | PRN
  Administered 2017-04-22: 5 mg via INTRAVENOUS

## 2017-04-22 MED ORDER — ONDANSETRON HCL 4 MG/2ML IJ SOLN
INTRAMUSCULAR | Status: AC
  Filled 2017-04-22: qty 2

## 2017-04-22 MED ORDER — OXYCODONE HCL 5 MG PO TABS: 5.0000 mg | ORAL_TABLET | Freq: Once | ORAL | Status: DC | PRN

## 2017-04-22 MED ORDER — MENTHOL 3 MG MT LOZG: 1.0000 | LOZENGE | OROMUCOSAL | Status: DC | PRN

## 2017-04-22 MED ORDER — TRANEXAMIC ACID 1000 MG/10ML IV SOLN
2000.0000 mg | INTRAVENOUS | Status: AC
  Administered 2017-04-22: 2000 mg via TOPICAL
  Filled 2017-04-22: qty 20

## 2017-04-22 MED ORDER — METHOCARBAMOL 500 MG PO TABS
500.0000 mg | ORAL_TABLET | Freq: Four times a day (QID) | ORAL | Status: DC | PRN
  Administered 2017-04-22: 500 mg via ORAL
  Filled 2017-04-22: qty 1

## 2017-04-22 MED ORDER — FENTANYL CITRATE (PF) 100 MCG/2ML IJ SOLN
100.0000 ug | Freq: Once | INTRAMUSCULAR | Status: AC
  Administered 2017-04-22: 100 ug via INTRAVENOUS

## 2017-04-22 MED ORDER — CHLORHEXIDINE GLUCONATE 4 % EX LIQD: 60.0000 mL | Freq: Once | CUTANEOUS | Status: DC

## 2017-04-22 MED ORDER — INSULIN ASPART 100 UNIT/ML ~~LOC~~ SOLN: 0.0000 [IU] | Freq: Three times a day (TID) | SUBCUTANEOUS | Status: DC

## 2017-04-22 MED ORDER — BISACODYL 10 MG RE SUPP: 10.0000 mg | Freq: Every day | RECTAL | Status: DC | PRN

## 2017-04-22 MED ORDER — FENTANYL CITRATE (PF) 100 MCG/2ML IJ SOLN
INTRAMUSCULAR | Status: AC
  Administered 2017-04-22: 100 ug via INTRAVENOUS
  Filled 2017-04-22: qty 2

## 2017-04-22 MED ORDER — CEFAZOLIN SODIUM-DEXTROSE 1-4 GM/50ML-% IV SOLN
1.0000 g | Freq: Four times a day (QID) | INTRAVENOUS | Status: AC
Start: 2017-04-22 — End: 2017-04-23
  Administered 2017-04-22 (×2): 1 g via INTRAVENOUS
  Filled 2017-04-22 (×2): qty 50

## 2017-04-22 MED ORDER — SODIUM CHLORIDE 0.9 % IR SOLN
Status: DC | PRN
  Administered 2017-04-22: 3000 mL

## 2017-04-22 MED ORDER — HYDROCODONE-ACETAMINOPHEN 5-325 MG PO TABS: 1.0000 | ORAL_TABLET | Freq: Four times a day (QID) | ORAL | Status: DC | PRN

## 2017-04-22 MED ORDER — METFORMIN HCL 500 MG PO TABS
500.0000 mg | ORAL_TABLET | Freq: Every day | ORAL | Status: DC
  Administered 2017-04-23: 500 mg via ORAL
  Filled 2017-04-22: qty 1

## 2017-04-22 MED ORDER — BUPIVACAINE-EPINEPHRINE (PF) 0.5% -1:200000 IJ SOLN
INTRAMUSCULAR | Status: DC | PRN
  Administered 2017-04-22: 30 mL via PERINEURAL

## 2017-04-22 MED ORDER — ONDANSETRON HCL 4 MG/2ML IJ SOLN
INTRAMUSCULAR | Status: DC | PRN
  Administered 2017-04-22: 4 mg via INTRAVENOUS

## 2017-04-22 MED ORDER — POLYETHYLENE GLYCOL 3350 17 G PO PACK: 17.0000 g | PACK | Freq: Every day | ORAL | Status: DC | PRN

## 2017-04-22 MED ORDER — ASPIRIN EC 325 MG PO TBEC
325.0000 mg | DELAYED_RELEASE_TABLET | Freq: Every day | ORAL | Status: DC
  Administered 2017-04-23: 325 mg via ORAL
  Filled 2017-04-22: qty 1

## 2017-04-22 MED ORDER — INSULIN ASPART 100 UNIT/ML ~~LOC~~ SOLN
0.0000 [IU] | Freq: Three times a day (TID) | SUBCUTANEOUS | Status: DC
  Administered 2017-04-22: 5 [IU] via SUBCUTANEOUS

## 2017-04-22 MED ORDER — OXYCODONE HCL 5 MG PO TABS
10.0000 mg | ORAL_TABLET | ORAL | Status: DC | PRN
  Filled 2017-04-22: qty 2

## 2017-04-22 MED ORDER — SODIUM CHLORIDE 0.9 % IV SOLN
INTRAVENOUS | Status: DC
  Administered 2017-04-22: 17:00:00 via INTRAVENOUS

## 2017-04-22 MED ORDER — FENTANYL CITRATE (PF) 100 MCG/2ML IJ SOLN
25.0000 ug | INTRAMUSCULAR | Status: DC | PRN
  Administered 2017-04-22 (×2): 25 ug via INTRAVENOUS

## 2017-04-22 MED ORDER — LACTATED RINGERS IV SOLN
INTRAVENOUS | Status: DC
  Administered 2017-04-22 (×2): via INTRAVENOUS

## 2017-04-22 MED ORDER — DOCUSATE SODIUM 100 MG PO CAPS
100.0000 mg | ORAL_CAPSULE | Freq: Two times a day (BID) | ORAL | Status: DC
  Administered 2017-04-22 – 2017-04-23 (×2): 100 mg via ORAL
  Filled 2017-04-22 (×2): qty 1

## 2017-04-22 MED ORDER — PROPOFOL 500 MG/50ML IV EMUL
INTRAVENOUS | Status: DC | PRN
  Administered 2017-04-22: 50 ug/kg/min via INTRAVENOUS

## 2017-04-22 MED ORDER — PROMETHAZINE HCL 25 MG/ML IJ SOLN: 6.2500 mg | INTRAMUSCULAR | Status: DC | PRN

## 2017-04-22 MED ORDER — MIDAZOLAM HCL 2 MG/2ML IJ SOLN
INTRAMUSCULAR | Status: AC
  Filled 2017-04-22: qty 2

## 2017-04-22 MED ORDER — METHOCARBAMOL 1000 MG/10ML IJ SOLN
500.0000 mg | Freq: Four times a day (QID) | INTRAVENOUS | Status: DC | PRN
  Filled 2017-04-22: qty 5

## 2017-04-22 MED ORDER — FENTANYL CITRATE (PF) 100 MCG/2ML IJ SOLN: 25.0000 ug | INTRAMUSCULAR | Status: DC | PRN

## 2017-04-22 MED ORDER — MIDAZOLAM HCL 2 MG/2ML IJ SOLN
2.0000 mg | Freq: Once | INTRAMUSCULAR | Status: AC
  Administered 2017-04-22: 2 mg via INTRAVENOUS

## 2017-04-22 MED ORDER — PHENOL 1.4 % MT LIQD: 1.0000 | OROMUCOSAL | Status: DC | PRN

## 2017-04-22 MED ORDER — DEXAMETHASONE SODIUM PHOSPHATE 10 MG/ML IJ SOLN
INTRAMUSCULAR | Status: AC
  Filled 2017-04-22: qty 1

## 2017-04-22 MED ORDER — MAGNESIUM CITRATE PO SOLN: 1.0000 | Freq: Once | ORAL | Status: DC | PRN

## 2017-04-22 MED ORDER — BUPIVACAINE IN DEXTROSE 0.75-8.25 % IT SOLN
INTRATHECAL | Status: DC | PRN
  Administered 2017-04-22: 1.8 mL via INTRATHECAL

## 2017-04-22 MED ORDER — HYDROMORPHONE HCL 1 MG/ML IJ SOLN: 1.0000 mg | INTRAMUSCULAR | Status: DC | PRN

## 2017-04-22 MED ORDER — METOCLOPRAMIDE HCL 5 MG/ML IJ SOLN: 5.0000 mg | Freq: Three times a day (TID) | INTRAMUSCULAR | Status: DC | PRN

## 2017-04-22 MED ORDER — ATORVASTATIN CALCIUM 10 MG PO TABS
10.0000 mg | ORAL_TABLET | ORAL | Status: DC
  Administered 2017-04-22: 10 mg via ORAL
  Filled 2017-04-22: qty 1

## 2017-04-22 SURGICAL SUPPLY — 58 items
APL SKNCLS STERI-STRIP NONHPOA (GAUZE/BANDAGES/DRESSINGS) ×1
BENZOIN TINCTURE PRP APPL 2/3 (GAUZE/BANDAGES/DRESSINGS) ×1 IMPLANT
BLADE SAW SAG 90X13X1.27 (BLADE) ×2 IMPLANT
BLADE SAW SGTL 13.0X1.19X90.0M (BLADE) ×2 IMPLANT
BNDG COHESIVE 6X5 TAN STRL LF (GAUZE/BANDAGES/DRESSINGS) ×4 IMPLANT
BOWL SMART MIX CTS (DISPOSABLE) IMPLANT
COVER BACK TABLE 24X17X13 BIG (DRAPES) IMPLANT
COVER SURGICAL LIGHT HANDLE (MISCELLANEOUS) ×4 IMPLANT
CUFF TOURNIQUET SINGLE 34IN LL (TOURNIQUET CUFF) IMPLANT
CUFF TOURNIQUET SINGLE 44IN (TOURNIQUET CUFF) IMPLANT
DRAPE HALF SHEET 40X57 (DRAPES) ×4 IMPLANT
DRAPE IMP U-DRAPE 54X76 (DRAPES) ×2 IMPLANT
DRAPE INCISE IOBAN 66X45 STRL (DRAPES) ×1 IMPLANT
DRAPE U-SHAPE 47X51 STRL (DRAPES) ×2 IMPLANT
DRESSING PREVENA PLUS CUSTOM (GAUZE/BANDAGES/DRESSINGS) IMPLANT
DRSG ADAPTIC 3X8 NADH LF (GAUZE/BANDAGES/DRESSINGS) ×2 IMPLANT
DRSG PAD ABDOMINAL 8X10 ST (GAUZE/BANDAGES/DRESSINGS) ×2 IMPLANT
DRSG PREVENA PLUS CUSTOM (GAUZE/BANDAGES/DRESSINGS) ×2
DURAPREP 26ML APPLICATOR (WOUND CARE) ×2 IMPLANT
ELECT REM PT RETURN 9FT ADLT (ELECTROSURGICAL) ×2
ELECTRODE REM PT RTRN 9FT ADLT (ELECTROSURGICAL) ×1 IMPLANT
FACESHIELD WRAPAROUND (MASK) ×2 IMPLANT
FACESHIELD WRAPAROUND OR TEAM (MASK) ×1 IMPLANT
GAUZE SPONGE 4X4 12PLY STRL (GAUZE/BANDAGES/DRESSINGS) ×2 IMPLANT
GLOVE BIOGEL PI IND STRL 9 (GLOVE) ×1 IMPLANT
GLOVE BIOGEL PI INDICATOR 9 (GLOVE) ×1
GLOVE SURG ORTHO 9.0 STRL STRW (GLOVE) ×2 IMPLANT
GOWN STRL REUS W/ TWL XL LVL3 (GOWN DISPOSABLE) ×3 IMPLANT
GOWN STRL REUS W/TWL XL LVL3 (GOWN DISPOSABLE) ×6
HANDPIECE INTERPULSE COAX TIP (DISPOSABLE)
INSERT TIBIA SCORPIO FLX 11X10 (Orthopedic Implant) ×1 IMPLANT
INSERT TIBIAL X3 (Orthopedic Implant) ×1 IMPLANT
KIT BASIN OR (CUSTOM PROCEDURE TRAY) ×2 IMPLANT
KIT PREVENA INCISION MGT20CM45 (CANNISTER) ×1 IMPLANT
KIT ROOM TURNOVER OR (KITS) ×2 IMPLANT
MANIFOLD NEPTUNE II (INSTRUMENTS) ×2 IMPLANT
NDL SPNL 18GX3.5 QUINCKE PK (NEEDLE) ×1 IMPLANT
NEEDLE SPNL 18GX3.5 QUINCKE PK (NEEDLE) ×2 IMPLANT
NS IRRIG 1000ML POUR BTL (IV SOLUTION) ×2 IMPLANT
PACK TOTAL JOINT (CUSTOM PROCEDURE TRAY) ×2 IMPLANT
PACK UNIVERSAL I (CUSTOM PROCEDURE TRAY) ×2 IMPLANT
PAD ARMBOARD 7.5X6 YLW CONV (MISCELLANEOUS) ×4 IMPLANT
PADDING CAST COTTON 6X4 STRL (CAST SUPPLIES) ×2 IMPLANT
SET HNDPC FAN SPRY TIP SCT (DISPOSABLE) IMPLANT
STAPLER VISISTAT 35W (STAPLE) ×2 IMPLANT
SUCTION FRAZIER HANDLE 10FR (MISCELLANEOUS)
SUCTION TUBE FRAZIER 10FR DISP (MISCELLANEOUS) IMPLANT
SUT ETHILON 2 0 PSLX (SUTURE) ×2 IMPLANT
SUT VIC AB 0 CTB1 27 (SUTURE) IMPLANT
SUT VIC AB 1 CTX 36 (SUTURE)
SUT VIC AB 1 CTX36XBRD ANBCTR (SUTURE) IMPLANT
SUT VIC AB 2-0 CTB1 (SUTURE) IMPLANT
SWAB CULTURE ESWAB REG 1ML (MISCELLANEOUS) ×2 IMPLANT
TOWEL OR 17X24 6PK STRL BLUE (TOWEL DISPOSABLE) ×2 IMPLANT
TOWEL OR 17X26 10 PK STRL BLUE (TOWEL DISPOSABLE) ×2 IMPLANT
TRAY FOLEY W/METER SILVER 16FR (SET/KITS/TRAYS/PACK) IMPLANT
WATER STERILE IRR 1000ML POUR (IV SOLUTION) ×2 IMPLANT
WRAP KNEE MAXI GEL POST OP (GAUZE/BANDAGES/DRESSINGS) ×2 IMPLANT

## 2017-04-22 NOTE — Anesthesia Procedure Notes (Signed)
Spinal  Patient location during procedure: OR Start time: 04/22/2017 10:28 AM End time: 04/22/2017 10:34 AM Staffing Anesthesiologist: Audry Pili, MD Performed: anesthesiologist  Preanesthetic Checklist Completed: patient identified, surgical consent, pre-op evaluation, timeout performed, IV checked, risks and benefits discussed and monitors and equipment checked Spinal Block Patient position: sitting Prep: DuraPrep Patient monitoring: heart rate, cardiac monitor, continuous pulse ox and blood pressure Approach: midline Location: L2-3 Injection technique: single-shot Needle Needle type: Pencan  Needle gauge: 24 G Additional Notes Functioning IV was confirmed and monitors were applied. Sterile prep and drape, including hand hygiene, mask, and sterile gloves were used. The patient was positioned and the spine was prepped. The skin was anesthetized with lidocaine. Free flow of clear CSF was obtained prior to injecting local anesthetic into the CSF. The spinal needle aspirated freely following injection. The needle was carefully withdrawn. The patient tolerated the procedure well. Consent was obtained prior to the procedure with all questions answered and concerns addressed. Risks including, but not limited to, bleeding, infection, nerve damage, paralysis, failed block, inadequate analgesia, allergic reaction, high spinal, itching, and headache were discussed and the patient wished to proceed.  Adam Don, MD

## 2017-04-22 NOTE — Anesthesia Postprocedure Evaluation (Signed)
Anesthesia Post Note  Patient: Adam Ware, Dr.  Jule Ser) Performed: REVISION RIGHT TOTAL KNEE ARTHROPLASTY VS. POLY EXCHANGE (Right )     Patient location during evaluation: PACU Anesthesia Type: Spinal Level of consciousness: awake and alert Pain management: pain level controlled Vital Signs Assessment: post-procedure vital signs reviewed and stable Respiratory status: spontaneous breathing and respiratory function stable Cardiovascular status: blood pressure returned to baseline and stable Postop Assessment: spinal receding and no apparent nausea or vomiting Anesthetic complications: no    Last Vitals:  Vitals:   04/22/17 1241 04/22/17 1300  BP:    Pulse: 77   Resp: (!) 24   Temp:  (!) 36.1 C  SpO2: 96%     Last Pain:  Vitals:   04/22/17 1230  TempSrc:   PainSc: [place]

## 2017-04-22 NOTE — Op Note (Signed)
04/22/2017  11:37 AM  PATIENT:  Adam Ware, Dr.    Santa Lighter DIAGNOSIS:  Failed Right Total Knee Arthroplasty  POST-OPERATIVE DIAGNOSIS:  Same  PROCEDURE:  REVISION RIGHT TOTAL KNEE ARTHROPLASTY . 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 male with a diagnosis of Failed Right Total Knee Arthroplasty who failed conservative measures and elected for surgical management.    The risks benefits and alternatives were discussed with the patient preoperatively including but not limited to the risks of infection, bleeding, nerve injury, cardiopulmonary complications, the need for revision surgery, among others, and the patient was willing to proceed.  OPERATIVE IMPLANTS: [employer] size 9--10 mm poly, Praveena incisional wound VAC 20 cm.  @ENCIMAGES @  OPERATIVE FINDINGS: Mild cellulitis minimal effusion no clinical signs of infection.  The components were well fixed.  OPERATIVE PROCEDURE: Patient was brought to the operating room after undergoing a femoral block he then underwent a spinal anesthetic.  After adequate levels of anesthesia were obtained patient's right lower extremity was prepped using DuraPrep draped into a Sterile Field, Charlie Pitter was used to cover all exposed skin.  A timeout was called.  Patient's previous midline incision was used this was carried down to a medial parapatellar retinacular incision.  There is a minimal amount of effusion in the knee.  There was some synovitis from the polyethylene wear the synovitis was excised in all 3 compartments.  There is no clinical signs of infection.  The polyethylene tray was removed the knee was irrigated with pulsatile lavage.  The femoral tibial and patellar components were well fixed.  After pulsatile lavage the same thickness polyethylene was placed.  Patient had approximately 5 mm of polyethylene wear from his  existing polyethylene.  This brought his knee out to full extension.  TXA was used to soak the knee the tourniquet was not used.  The retinaculum was closed using #1 Vicryl the skin and subcutaneous tissue was closed using 2-0 nylon.  A Praveena incisional wound VAC was applied patient was taken the recovery room in stable condition.   DISCHARGE PLANNING:  Antibiotic duration: Perioperative for 24 hours  Weightbearing: Weightbearing as tolerated  Pain medication: As prescribed  Dressing care/ Wound VAC: Continue wound VAC for 1 week.  Ambulatory devices: Walker  Discharge to: Home tomorrow  Follow-up: In the office 1 week post operative.

## 2017-04-22 NOTE — Transfer of Care (Signed)
Immediate Anesthesia Transfer of Care Note  Patient: Adam Ware, Dr.  Jule Ser) Performed: REVISION RIGHT TOTAL KNEE ARTHROPLASTY VS. POLY EXCHANGE (Right )  Patient Location: PACU  Anesthesia Type:MAC, Regional and Spinal  Level of Consciousness: drowsy and patient cooperative  Airway & Oxygen Therapy: Patient Spontanous Breathing and Patient connected to face mask oxygen  Post-op Assessment: Report given to RN  Post vital signs: Reviewed and stable  Last Vitals:  Vitals:   04/22/17 0852 04/22/17 0930  BP: (!) 171/98 (!) (P) 144/79  Pulse: 87   Resp: 18   Temp: 36.8 C   SpO2: 97%     Last Pain:  Vitals:   04/22/17 0852  TempSrc: Oral  PainSc: 3       Patients Stated Pain Goal: 3 (71/24/58 0998)  Complications: No apparent anesthesia complications

## 2017-04-22 NOTE — Anesthesia Procedure Notes (Signed)
Anesthesia Regional Block: Adductor canal block   Pre-Anesthetic Checklist: ,, timeout performed, Correct Patient, Correct Site, Correct Laterality, Correct Procedure, Correct Position, site marked, Risks and benefits discussed,  Surgical consent,  Pre-op evaluation,  At surgeon's request and post-op pain management  Laterality: Right  Prep: chloraprep       Needles:  Injection technique: Single-shot  Needle Type: Echogenic Needle     Needle Length: 10cm  Needle Gauge: 21     Additional Needles:   Narrative:  Start time: 04/22/2017 9:25 AM End time: 04/22/2017 9:28 AM Injection made incrementally with aspirations every 5 mL.  Performed by: Personally  Anesthesiologist: Audry Pili, MD  Additional Notes: No pain on injection. No increased resistance to injection. Injection made in 5cc increments. Good needle visualization. Patient tolerated the procedure well.

## 2017-04-22 NOTE — H&P (Signed)
TOTAL KNEE REVISION ADMISSION H&P  Patient is being admitted for right revision total knee arthroplasty.  Subjective:  Chief Complaint:right knee pain.  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 The indications for the revision of the total knee arthroplasty are bearing surface wear leading to symptomatic synovitis and implant or knee misalignment. Onset of symptoms was gradual starting 8 years ago with rapidlly worsening course since that time.  Prior procedures on the right knee(s) include arthroplasty.  Patient currently rates pain in the right knee(s) at 8 out of 10 with activity. There is night pain, worsening of pain with activity and weight bearing, pain that interferes with activities of daily living, pain with passive range of motion and joint swelling.  Patient has evidence of joint space narrowing by imaging studies. This condition presents safety issues increasing the risk of falls. This patient has had Polyethylene wear..  There is no current active infection.  Patient Active Problem List   Diagnosis Date Noted  . Spondylolisthesis, lumbar region 04/21/2017   Past Medical History:  Diagnosis Date  . Arthritis   . Diabetes mellitus without complication ([place])   . Hair loss   . History of kidney stones   . Hyperlipidemia     Past Surgical History:  Procedure Laterality Date  . APPENDECTOMY    . JOINT REPLACEMENT    . LITHOTRIPSY    . REPLACEMENT TOTAL KNEE Right     Current Facility-Administered Medications  Medication Dose Route Frequency Provider Last Rate Last Dose  . ceFAZolin (ANCEF) IVPB 2g/100 mL premix  2 g Intravenous To SS-Surg Newt Minion, MD       Current Outpatient Medications   Medication Sig Dispense Refill Last Dose  . atorvastatin (LIPITOR) 10 MG tablet Take 1 tablet (10 mg total) by mouth 3 (three) times a week. 100 tablet 3   . eszopiclone (LUNESTA) 2 MG TABS tablet Take 1 tablet (2 mg total) by mouth at bedtime as needed for sleep. Take immediately before bedtime 30 tablet 5   . finasteride (PROSCAR) 5 MG tablet Take 1 tablet (5 mg total) by mouth daily. 90 tablet 4   . metFORMIN (GLUCOPHAGE) 500 MG tablet Take 500 mg by mouth daily with breakfast.    Taking  . tizanidine (ZANAFLEX) 2 MG capsule 1 by mouth 3 times a day when necessary 50 capsule 2   . zolpidem (AMBIEN) 5 MG tablet Take 1 tablet (5 mg total) by mouth at bedtime as needed for sleep. (Patient not taking: Reported on 04/14/2017) 30 tablet 5 Not Taking at Unknown time   Allergies  Allergen Reactions  . Sulfa Antibiotics Other (See Comments)    Fixed drug reaction    Social History   Tobacco Use  . Smoking status: Never Smoker  . Smokeless tobacco: Never Used  Substance Use Topics  . Alcohol use: Yes    Family History  Problem Relation Age of Onset  . Cancer Mother   . Diabetes Mother   . Mental illness Mother   . Heart disease Father   . Arthritis Brother       Review of Systems  All other systems reviewed and are negative.    Objective:  Physical Exam  Vital signs in last  24 hours:    Labs:  Estimated body mass index is 27.9 kg/m as calculated from the following:   Height as of 04/20/17: 6\' 1"  (1.854 m).   Weight as of 04/20/17: 211 lb 8 oz (95.9 kg).  Imaging Review Plain radiographs demonstrate Stable fixation of the femoral and tibial component degenerative joint disease of the right knee(s). The overall alignment is neutral.There is evidence of loosening of the Components appears stable components. The bone quality appears to be good for age and reported activity level. There is wear of the polyethylene tray with decreased space between the femoral and tibial  components.  Assessment/Plan:  End stage arthritis, right knee(s) with failed previous arthroplasty.   The patient history, physical examination, clinical judgment of the provider and imaging studies are consistent with end stage degenerative joint disease of the right knee(s), previous total knee arthroplasty. Revision total knee arthroplasty is deemed medically necessary. The treatment options including medical management, injection therapy, arthroscopy and revision arthroplasty were discussed at length. The risks and benefits of revision total knee arthroplasty were presented and reviewed. The risks due to aseptic loosening, infection, stiffness, patella tracking problems, thromboembolic complications and other imponderables were discussed. The patient acknowledged the explanation, agreed to proceed with the plan and consent was signed. Patient is being admitted for inpatient treatment for surgery, pain control, PT, OT, prophylactic antibiotics, VTE prophylaxis, progressive ambulation and ADL's and discharge planning.The patient is planning to be discharged home with home health services

## 2017-04-22 NOTE — Anesthesia Preprocedure Evaluation (Addendum)
Anesthesia Evaluation  Patient identified by MRN, date of birth, ID band Patient awake    Reviewed: Allergy & Precautions, NPO status , Patient's Chart, lab work & pertinent test results  Airway Mallampati: II  TM Distance: >3 FB Neck ROM: Full    Dental  (+) Dental Advisory Given, Teeth Intact   Pulmonary neg pulmonary ROS,    Pulmonary exam normal breath sounds clear to auscultation       Cardiovascular negative cardio ROS Normal cardiovascular exam Rhythm:Regular Rate:Normal     Neuro/Psych negative neurological ROS  negative psych ROS   GI/Hepatic negative GI ROS, Neg liver ROS,   Endo/Other  diabetes, Type 2, Oral Hypoglycemic Agents  Renal/GU Hx nephrolithiasis  negative genitourinary   Musculoskeletal  (+) Arthritis ,   Abdominal   Peds  Hematology negative hematology ROS (+)   Anesthesia Other Findings   Reproductive/Obstetrics                           Anesthesia Physical Anesthesia Plan  ASA: II  Anesthesia Plan: Spinal   Post-op Pain Management:  Regional for Post-op pain   Induction:   PONV Risk Score and Plan: Treatment may vary due to age or medical condition and Propofol infusion  Airway Management Planned: Natural Airway and Simple Face Mask  Additional Equipment: None  Intra-op Plan:   Post-operative Plan:   Informed Consent: I have reviewed the patients History and Physical, chart, labs and discussed the procedure including the risks, benefits and alternatives for the proposed anesthesia with the patient or authorized representative who has indicated his/her understanding and acceptance.   Dental advisory given  Plan Discussed with: CRNA  Anesthesia Plan Comments:         Anesthesia Quick Evaluation

## 2017-04-22 NOTE — Anesthesia Procedure Notes (Signed)
Procedure Name: MAC Date/Time: 04/22/2017 10:27 AM Performed by: Barrington Ellison, CRNA Pre-anesthesia Checklist: Patient identified, Emergency Drugs available, Suction available, Patient being monitored and Timeout performed Patient Re-evaluated:Patient Re-evaluated prior to induction Oxygen Delivery Method: Simple face mask

## 2017-04-23 ENCOUNTER — Encounter (HOSPITAL_COMMUNITY): Payer: Self-pay | Admitting: Orthopedic Surgery

## 2017-04-23 LAB — GLUCOSE, CAPILLARY: Glucose-Capillary: 128 mg/dL — ABNORMAL HIGH (ref 65–99)

## 2017-04-23 MED ORDER — HYDROCODONE-ACETAMINOPHEN 5-325 MG PO TABS: 1.0000 | ORAL_TABLET | ORAL | 0 refills | Status: DC | PRN

## 2017-04-23 NOTE — Evaluation (Signed)
Physical Therapy Evaluation Patient Details Name: Adam Ware, Dr. MRN: [id number] DOB: 09-20-1950 Today's Date: 04/23/2017   History of Present Illness  Pt is a 67 y/o male s/p REVISION RIGHT TOTAL KNEE ARTHROPLASTY. PMH including but not limited to DM.  Clinical Impression  Pt presented sitting OOB in recliner chair, awake and willing to participate in therapy session. Prior to admission, pt reported that he had been using bilateral axillary crutches to ambulate secondary to significant R hip and back pain. Pt currently performing transfers independently and ambulating in hallway with supervision with and without use of crutches. Pt steady with gait, no LOB or need for physical assistance. Pt declining stair training at this time, reporting that he would have no issues with the stairs. Pt would benefit from further PT services in the Outpatient setting. No further acute PT needs identified at this time. PT signing off.     Follow Up Recommendations Outpatient PT    Equipment Recommendations  None recommended by PT    Recommendations for Other Services       Precautions / Restrictions Precautions Precautions: Fall Restrictions Weight Bearing Restrictions: Yes RLE Weight Bearing: Weight bearing as tolerated      Mobility  Bed Mobility               General bed mobility comments: pt in recliner chair upon arrival  Transfers Overall transfer level: Independent Equipment used: None                Ambulation/Gait Ambulation/Gait assistance: Supervision Ambulation Distance (Feet): 75 Feet Assistive device: Crutches;None Gait Pattern/deviations: Step-through pattern;Decreased step length - right;Decreased step length - left;Decreased stride length;Decreased dorsiflexion - right;Decreased weight shift to right Gait velocity: variable Gait velocity interpretation: at or above normal speed for age/gender General Gait Details: pt with steady gait pattern with and  without use of AD; no LOB or need for any physical assistance  Stairs            Wheelchair Mobility    Modified Rankin (Stroke Patients Only)       Balance Overall balance assessment: Needs assistance Sitting-balance support: Feet supported Sitting balance-Leahy Scale: Normal     Standing balance support: During functional activity;No upper extremity supported Standing balance-Leahy Scale: Good                               Pertinent Vitals/Pain Pain Assessment: 0-10 Pain Score: 2  Pain Location: R glutes Pain Descriptors / Indicators: Sore;Aching Pain Intervention(s): Monitored during session    Home Living Family/patient expects to be discharged to:: Private residence Living Arrangements: Alone Available Help at Discharge: Friend(s);Family;Available PRN/intermittently           Home Equipment: Crutches      Prior Function Level of Independence: Independent;Independent with assistive device(s)         Comments: for the past 2-3 weeks pt has been ambulating with bilateral axillary crutches secondary to hip and back pain     Hand Dominance        Extremity/Trunk Assessment   Upper Extremity Assessment Upper Extremity Assessment: Overall WFL for tasks assessed    Lower Extremity Assessment Lower Extremity Assessment: RLE deficits/detail RLE Deficits / Details: pt with AROM limitation at R knee; flexion = ~80 degrees; extension = 0; measured in sitting    Cervical / Trunk Assessment Cervical / Trunk Assessment: Normal  Communication   Communication: No difficulties  Cognition  Arousal/Alertness: Awake/alert Behavior During Therapy: WFL for tasks assessed/performed Overall Cognitive Status: Within Functional Limits for tasks assessed                                        General Comments      Exercises     Assessment/Plan    PT Assessment All further PT needs can be met in the next venue of care  PT  Problem List Decreased range of motion;Decreased mobility;Decreased coordination       PT Treatment Interventions      PT Goals (Current goals can be found in the Care Plan section)  Acute Rehab PT Goals Patient Stated Goal: return home ASAP    Frequency     Barriers to discharge        Co-evaluation               AM-PAC PT "6 Clicks" Daily Activity  Outcome Measure Difficulty turning over in bed (including adjusting bedclothes, sheets and blankets)?: None Difficulty moving from lying on back to sitting on the side of the bed? : None Difficulty sitting down on and standing up from a chair with arms (e.g., wheelchair, bedside commode, etc,.)?: None Help needed moving to and from a bed to chair (including a wheelchair)?: None Help needed walking in hospital room?: None Help needed climbing 3-5 steps with a railing? : None 6 Click Score: 24    End of Session   Activity Tolerance: Patient tolerated treatment well Patient left: in chair;with call bell/phone within reach Nurse Communication: Mobility status PT Visit Diagnosis: Other abnormalities of gait and mobility (R26.89)    Time: 6659-9357 PT Time Calculation (min) (ACUTE ONLY): 24 min   Charges:   PT Evaluation $PT Eval Low Complexity: 1 Low PT Treatments $Gait Training: 8-22 mins   PT G Codes:        [place], PT, DPT [place] 04/23/2017, 8:40 AM

## 2017-04-23 NOTE — Discharge Summary (Signed)
Discharge Diagnoses:  Active Problems:   Failed total knee, right, sequela   S/P revision of total knee, right   Surgeries: Procedure(s): REVISION RIGHT TOTAL KNEE ARTHROPLASTY VS. POLY EXCHANGE on 04/22/2017    Consultants:   Discharged Condition: Improved  Hospital Course: HALTON NEAS, Dr. is an 67 y.o. male who was admitted 04/22/2017 with a chief complaint of failed TKA, with a final diagnosis of Failed Right Total Knee Arthroplasty.  Patient was brought to the operating room on 04/22/2017 and underwent Procedure(s): REVISION RIGHT TOTAL KNEE ARTHROPLASTY VS. POLY EXCHANGE.    Patient was given perioperative antibiotics:  Anti-infectives (From admission, onward)   Start     Dose/Rate Route Frequency Ordered Stop   04/22/17 1730  ceFAZolin (ANCEF) IVPB 1 g/50 mL premix     1 g 100 mL/hr over 30 Minutes Intravenous Every 6 hours 04/22/17 1651 04/23/17 0334   04/22/17 0900  ceFAZolin (ANCEF) IVPB 2g/100 mL premix     2 g 200 mL/hr over 30 Minutes Intravenous To ShortStay Surgical 04/21/17 0950 04/22/17 1023    .  Patient was given sequential compression devices, early ambulation, and aspirin for DVT prophylaxis.  Recent vital signs:  Patient Vitals for the past 24 hrs:  BP Temp Temp src Pulse Resp SpO2 Height Weight  04/22/17 2330 105/60 98.8 F (37.1 C) Oral 78 16 96 % - -  04/22/17 2100 134/71 98.9 F (37.2 C) Oral 87 18 95 % - -  04/22/17 1650 (!) 146/87 98.5 F (36.9 C) Oral - - 96 % - -  04/22/17 1515 - - - 89 20 97 % - -  04/22/17 1511 (!) 145/85 - - 82 18 97 % - -  04/22/17 1500 - - - 80 (!) 9 99 % - -  04/22/17 1445 - - - 86 10 99 % - -  04/22/17 1441 (!) 152/79 - - 77 19 100 % - -  04/22/17 1430 - - - 78 17 98 % - -  04/22/17 1415 - - - 79 10 99 % - -  04/22/17 1411 (!) 156/94 - - 74 10 98 % - -  04/22/17 1400 - - - 79 (!) 25 100 % - -  04/22/17 1350 - - - 72 15 100 % - -  04/22/17 1345 - - - 73 15 98 % - -  04/22/17 1341 (!) 157/97 - - 72 15 100 % - -   04/22/17 1340 - - - 72 13 98 % - -  04/22/17 1330 - - - 72 16 100 % - -  04/22/17 1326 (!) 166/101 - - 71 13 99 % - -  04/22/17 1320 - - - 83 17 100 % - -  04/22/17 1315 - - - 77 (!) 21 99 % - -  04/22/17 1311 (!) 153/96 - - 70 15 99 % - -  04/22/17 1310 - - - 80 13 100 % - -  04/22/17 1300 - (!) 97 F (36.1 C) - 75 18 99 % - -  04/22/17 1256 (!) 157/87 - - 73 18 100 % - -  04/22/17 1250 - - - 62 11 100 % - -  04/22/17 1245 - - - 67 11 99 % - -  04/22/17 1241 - - - 77 (!) 24 96 % - -  04/22/17 1240 (!) 155/96 - - 75 (!) 23 98 % - -  04/22/17 1239 - - - 72 (!) 9 100 % - -  04/22/17 1230 - - - 72 13 99 % - -  04/22/17 1226 (!) 151/87 - - 71 16 100 % - -  04/22/17 1220 - - - 74 (!) 21 100 % - -  04/22/17 1215 138/84 98 F (36.7 C) - 79 20 100 % - -  04/22/17 1210 138/84 - - 73 (!) 28 100 % - -  04/22/17 1200 118/84 - - 73 13 97 % - -  04/22/17 1155 118/84 - - 74 12 97 % - -  04/22/17 1152 115/83 - - 75 12 96 % - -  04/22/17 1150 - - - 78 14 96 % - -  04/22/17 1145 115/83 - - 82 10 100 % - -  04/22/17 1142 - - - 81 20 100 % - -  04/22/17 1141 123/83 98.1 F (36.7 C) - 79 13 100 % - -  04/22/17 0930 (!) 144/79 - - - - - - -  04/22/17 0852 (!) 171/98 98.3 F (36.8 C) Oral 87 18 97 % 6\' 1"  (1.854 m) 211 lb 8 oz (95.9 kg)  .  Recent laboratory studies: Xr Hip Unilat W Or W/o Pelvis 2-3 Views Right  Result Date: 04/21/2017 2 view radiographs of the right hip shows good maintenance of the joint space there is very small bony spur inferiorly.  There is no subcondylar cysts no subcondylar sclerosis.  Xr Lumbar Spine 2-3 Views  Result Date: 04/21/2017 2 view radiographs of the lumbar spine shows calcification of the aorta without aneurysm.  Maximum diameter 22 mm.  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  There is straightening of the lumbar lordosis secondary to muscle spasm there is a degenerative scoliosis secondary to the  spondylolisthesis.   Discharge Medications:   Allergies as of 04/23/2017      Reactions   Sulfa Antibiotics Other (See Comments)   Fixed drug reaction      Medication List    TAKE these medications   atorvastatin 10 MG tablet Commonly known as:  LIPITOR Take 1 tablet (10 mg total) by mouth 3 (three) times a week.   eszopiclone 2 MG Tabs tablet Commonly known as:  LUNESTA Take 1 tablet (2 mg total) by mouth at bedtime as needed for sleep. Take immediately before bedtime   finasteride 5 MG tablet Commonly known as:  PROSCAR Take 1 tablet (5 mg total) by mouth daily.   HYDROcodone-acetaminophen 5-325 MG tablet Commonly known as:  NORCO/VICODIN Take 1-2 tablets by mouth every 4 (four) hours as needed for moderate pain.   metFORMIN 500 MG tablet Commonly known as:  GLUCOPHAGE Take 500 mg by mouth daily with breakfast.   tizanidine 2 MG capsule Commonly known as:  ZANAFLEX 1 by mouth 3 times a day when necessary   zolpidem 5 MG tablet Commonly known as:  AMBIEN Take 1 tablet (5 mg total) by mouth at bedtime as needed for sleep.            Discharge Care Instructions  (From admission, onward)        Start     Ordered   04/23/17 0000  Weight bearing as tolerated     04/23/17 0644      Diagnostic Studies: Dg Knee Complete 4 Views Right  Result Date: 04/07/2017 CLINICAL DATA:  Pain for 2 weeks.  Prior knee replacement. EXAM: RIGHT KNEE - COMPLETE 4+ VIEW COMPARISON:  None FINDINGS: Right total knee arthroplasty. No hardware  complication identified. A moderate suprapatellar joint effusion is seen. IMPRESSION: Moderate joint effusion. Electronically Signed   By: Abigail Miyamoto M.D.   On: 04/07/2017 08:43   Xr Hip Unilat W Or W/o Pelvis 2-3 Views Right  Result Date: 04/21/2017 2 view radiographs of the right hip shows good maintenance of the joint space there is very small bony spur inferiorly.  There is no subcondylar cysts no subcondylar sclerosis.  Xr Lumbar Spine  2-3 Views  Result Date: 04/21/2017 2 view radiographs of the lumbar spine shows calcification of the aorta without aneurysm.  Maximum diameter 22 mm.  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   Patient benefited maximally from their hospital stay and there were no complications.     Disposition: 01-Home or Self Care Discharge Instructions    Call MD / Call 911   Complete by:  As directed    If you experience chest pain or shortness of breath, CALL 911 and be transported to the hospital emergency room.  If you develope a fever above 101 F, pus (white drainage) or increased drainage or redness at the wound, or calf pain, call your surgeon's office.   Constipation Prevention   Complete by:  As directed    Drink plenty of fluids.  Prune juice may be helpful.  You may use a stool softener, such as Colace (over the counter) 100 mg twice a day.  Use MiraLax (over the counter) for constipation as needed.   Diet - low sodium heart healthy   Complete by:  As directed    Elevate operative extremity   Complete by:  As directed    Ice pack   Complete by:  As directed    Increase activity slowly as tolerated   Complete by:  As directed    Negative Pressure Wound Therapy - Incisional   Complete by:  As directed    Weight bearing as tolerated   Complete by:  As directed      Follow-up Information    Newt Minion, MD Follow up in 2 week(s).   Specialty:  Orthopedic Surgery Contact information: [place] [address] [phone]            Signed: Newt Minion 04/23/2017, 6:44 AM

## 2017-04-23 NOTE — Care Management Note (Signed)
Case Management Note  Patient Details  Name: Adam Ware, Dr. MRN: [id number] Date of Birth: 21-Aug-1950  Subjective/Objective: 67 yr old gentleman s/p revision of right total knee arthroplasty.                     Action/Plan: Case manager spoke with patient concerning discharge plan and DME. Dr. Redmond School says he wants to go to outpatient therapy, today. He requested I call [facility] [phone] to arrange appointment. CM spoke with Silas Sacramento at [facility], patient has appointment for Friday, April 24, 2017. He will have support at discharge.    Expected Discharge Date:  04/23/17               Expected Discharge Plan:  Home/Self Care  In-House Referral:  NA  Discharge planning Services  CM Consult  Post Acute Care Choice:  NA(Outpatient therapy arranged) Choice offered to:     DME Arranged:  N/A(has crutches) DME Agency:  NA  HH Arranged:  NA HH Agency:  NA  Status of Service:  Completed, signed off  If discussed at [place] of Stay Meetings, dates discussed:    Additional Comments:  Ninfa Meeker, RN 04/23/2017, 11:34 AM

## 2017-04-24 DIAGNOSIS — M25551 Pain in right hip: Secondary | ICD-10-CM | POA: Diagnosis not present

## 2017-04-27 ENCOUNTER — Other Ambulatory Visit: Payer: Self-pay | Admitting: Family Medicine

## 2017-04-27 MED ORDER — OMEGA-3-ACID ETHYL ESTERS 1 G PO CAPS: 1.0000 g | ORAL_CAPSULE | Freq: Two times a day (BID) | ORAL | 3 refills | Status: DC

## 2017-04-27 MED ORDER — LISINOPRIL 5 MG PO TABS: 5.0000 mg | ORAL_TABLET | Freq: Every day | ORAL | 3 refills | Status: DC

## 2017-04-27 MED FILL — OMEGA-3 ETHYL ESTERS 1 GM C: 1 | 90 days supply | Qty: 180 | Fill #0

## 2017-04-27 MED FILL — LISINOPRIL 5 MG TABLET: 5 | 90 days supply | Qty: 90 | Fill #0

## 2017-05-01 ENCOUNTER — Other Ambulatory Visit (INDEPENDENT_AMBULATORY_CARE_PROVIDER_SITE_OTHER): Payer: Self-pay | Admitting: Radiology

## 2017-05-01 DIAGNOSIS — M4317 Spondylolisthesis, lumbosacral region: Secondary | ICD-10-CM

## 2017-05-01 DIAGNOSIS — M545 Low back pain: Secondary | ICD-10-CM | POA: Diagnosis not present

## 2017-05-06 ENCOUNTER — Ambulatory Visit (INDEPENDENT_AMBULATORY_CARE_PROVIDER_SITE_OTHER): Payer: PPO

## 2017-05-06 ENCOUNTER — Ambulatory Visit: Payer: PPO | Admitting: Podiatry

## 2017-05-06 ENCOUNTER — Ambulatory Visit (INDEPENDENT_AMBULATORY_CARE_PROVIDER_SITE_OTHER): Payer: PPO | Admitting: Orthopedic Surgery

## 2017-05-06 ENCOUNTER — Encounter (INDEPENDENT_AMBULATORY_CARE_PROVIDER_SITE_OTHER): Payer: Self-pay | Admitting: Orthopedic Surgery

## 2017-05-06 ENCOUNTER — Encounter: Payer: Self-pay | Admitting: Podiatry

## 2017-05-06 VITALS — Ht 73.0 in | Wt 211.5 lb

## 2017-05-06 DIAGNOSIS — M25561 Pain in right knee: Secondary | ICD-10-CM | POA: Diagnosis not present

## 2017-05-06 DIAGNOSIS — M5441 Lumbago with sciatica, right side: Secondary | ICD-10-CM

## 2017-05-06 DIAGNOSIS — M2040 Other hammer toe(s) (acquired), unspecified foot: Secondary | ICD-10-CM

## 2017-05-06 DIAGNOSIS — Z96651 Presence of right artificial knee joint: Secondary | ICD-10-CM

## 2017-05-06 DIAGNOSIS — G8929 Other chronic pain: Secondary | ICD-10-CM

## 2017-05-06 DIAGNOSIS — M48061 Spinal stenosis, lumbar region without neurogenic claudication: Secondary | ICD-10-CM

## 2017-05-06 MED ORDER — MUPIROCIN 2 % EX OINT: 1.0000 "application " | TOPICAL_OINTMENT | Freq: Two times a day (BID) | CUTANEOUS | 3 refills | Status: DC

## 2017-05-06 MED FILL — MUPIROCIN 2% OINTMENT: 2 | 20 days supply | Qty: 22 | Fill #0

## 2017-05-06 NOTE — Progress Notes (Signed)
Office Visit Note   Patient: Adam Ware, Dr.           Date of Birth: May 24, 1950           MRN: [id number] Visit Date: 05/06/2017              Requested by: Dorena Cookey, MD [place], [place] [address] PCP: Dorena Cookey, MD  Chief Complaint  Patient presents with  . Right Knee - Routine Post Op    04/22/17 Revision right knee arthoplasty  . Lower Back - Pain    S/p MRI      HPI: Patient presents in follow-up status post revision polyethylene tray right total knee arthroplasty.  Patient denies any problems.  He does have a small drop of serous drainage.  Assessment & Plan: Visit Diagnoses:  1. Right knee pain, unspecified chronicity   2. Status post revision of total replacement of right knee     Plan: Patient does have venous swelling recommended a knee-high stocking double extra-large pulled over the need to be worn around the clock to help decrease the venous swelling.  A prescription was called in for Bactroban to be applied to the wound.  We will follow this closely.  Review of the patient's MRI scan does show severe stenosis at the level of the pars defect.  He has a pars defect of L5 with grade 1 anterolisthesis with severe bilateral neuroforaminal stenosis with L5 nerve root compression.  Discussed that if this becomes more symptomatic he should call for an appointment with Dr. Ernestina Patches for a transforaminal injection.  Follow-Up Instructions: Return in about 2 weeks (around 05/20/2017).   Ortho Exam  Patient is alert, oriented, no adenopathy, well-dressed, normal affect, normal respiratory effort. Examination patient has venous stasis swelling in the leg with swelling within the knee as well.  Patient has no pain with range of motion he has full extension and flexion to 120 degrees.  There is no warmth no redness.  There is a wound that is 1 x 2 mm with some clear serous drainage most likely from the venous insufficiency. Patient's MRI scan was  obtained at [facility].  This shows bilateral L5 pars defect with L5 nerve root compression from severe stenosis.  Imaging: Dg Foot 2 Views Left  Result Date: 05/06/2017 Please see detailed radiograph report in office note.  Dg Foot 2 Views Right  Result Date: 05/06/2017 Please see detailed radiograph report in office note.  No images are attached to the encounter.  Labs: Lab Results  Component Value Date   HGBA1C 6.2 (H) 04/20/2017   GRAMSTAIN No WBC Seen 10/21/2012   GRAMSTAIN No Squamous Epithelial Cells Seen 10/21/2012   GRAMSTAIN No Organisms Seen 10/21/2012   LABORGA Multiple Organisms Present,None Predominant 10/21/2012    @LABSALLVALUES (HGBA1)@  Body mass index is 27.9 kg/m.  Orders:  Orders Placed This Encounter  Procedures  . XR Knee 1-2 Views Right   Meds ordered this encounter  Medications  . mupirocin ointment (BACTROBAN) 2 %    Sig: Apply 1 application topically 2 (two) times daily. Apply to the affected area 2 times a day    Dispense:  22 g    Refill:  3     Procedures: No procedures performed  Clinical Data: No additional findings.  ROS:  All other systems negative, except as noted in the HPI. Review of Systems  Objective: Vital Signs: Ht 6\' 1"  (1.854 m)   Wt 211 lb  8 oz (95.9 kg)   BMI 27.90 kg/m   Specialty Comments:  No specialty comments available.  PMFS History: Patient Active Problem List   Diagnosis Date Noted  . Status post revision of total replacement of right knee 04/22/2017  . Failed total knee, right, sequela   . Spondylolisthesis, lumbar region 04/21/2017   Past Medical History:  Diagnosis Date  . Arthritis   . Diabetes mellitus without complication ([place])   . Hair loss   . History of kidney stones   . Hyperlipidemia     Family History  Problem Relation Age of Onset  . Cancer Mother   . Diabetes Mother   . Mental illness Mother   . Heart disease Father   . Arthritis Brother     Past Surgical History:  Procedure  Laterality Date  . APPENDECTOMY    . JOINT REPLACEMENT    . LITHOTRIPSY    . REPLACEMENT TOTAL KNEE Right   . TOTAL KNEE REVISION Right 04/22/2017   Procedure: REVISION RIGHT TOTAL KNEE ARTHROPLASTY VS. POLY EXCHANGE;  Surgeon: Newt Minion, MD;  Location: [facility];  Service: Orthopedics;  Laterality: Right;   Social History   Occupational History  . Not on file  Tobacco Use  . Smoking status: Never Smoker  . Smokeless tobacco: Never Used  Substance and Sexual Activity  . Alcohol use: Yes  . Drug use: No  . Sexual activity: Not on file

## 2017-05-06 NOTE — Addendum Note (Signed)
Addended by: Pamella Pert on: 05/06/2017 05:21 PM   Modules accepted: Orders

## 2017-05-06 NOTE — Patient Instructions (Signed)
Hammer Toe Hammer toe is a change in the shape (a deformity) of your second, third, or fourth toe. The deformity causes the middle joint of your toe to stay bent. This causes pain, especially when you are wearing shoes. Hammer toe starts gradually. At first, the toe can be straightened. Gradually over time, the deformity becomes stiff and permanent. Early treatments to keep the toe straight may relieve pain. As the deformity becomes stiff and permanent, surgery may be needed to straighten the toe. What are the causes? Hammer toe is caused by abnormal bending of the toe joint that is closest to your foot. It happens gradually over time. This pulls on the muscles and connections (tendons) of the toe joint, making them weak and stiff. It is often related to wearing shoes that are too short or narrow and do not let your toes straighten. What increases the risk? You may be at greater risk for hammer toe if you:  Are male.  Are older.  Wear shoes that are too small.  Wear high-heeled shoes that pinch your toes.  Are a ballet dancer.  Have a second toe that is longer than your big toe (first toe).  Injure your foot or toe.  Have arthritis.  Have a family history of hammer toe.  Have a nerve or muscle disorder.  What are the signs or symptoms? The main symptoms of this condition are pain and deformity of the toe. The pain is worse when wearing shoes, walking, or running. Other symptoms may include:  Corns or calluses over the bent part of the toe or between the toes.  Redness and a burning feeling on the toe.  An open sore that forms on the top of the toe.  Not being able to straighten the toe.  How is this diagnosed? This condition is diagnosed based on your symptoms and a physical exam. During the exam, your health care provider will try to straighten your toe to see how stiff the deformity is. You may also have tests, such as:  A blood test to check for rheumatoid  arthritis.  An X-ray to show how severe the deformity is.  How is this treated? Treatment for this condition will depend on how stiff the deformity is. Surgery is often needed. However, sometimes a hammer toe can be straightened without surgery. Treatments that do not involve surgery include:  Taping the toe into a straightened position.  Using pads and cushions to protect the toe (orthotics).  Wearing shoes that provide enough room for the toes.  Doing toe-stretching exercises at home.  Taking an NSAID to reduce pain and swelling.  If these treatments do not help or the toe cannot be straightened, surgery is the next option. The most common surgeries used to straighten a hammer toe include:  Arthroplasty. In this procedure, part of the joint is removed, and that allows the toe to straighten.  Fusion. In this procedure, cartilage between the two bones of the joint is taken out and the bones are fused together into one longer bone.  Implantation. In this procedure, part of the bone is removed and replaced with an implant to let the toe move again.  Flexor tendon transfer. In this procedure, the tendons that curl the toes down (flexor tendons) are repositioned.  Follow these instructions at home:  Take over-the-counter and prescription medicines only as told by your health care provider.  Do toe straightening and stretching exercises as told by your health care provider.  Keep all   follow-up visits as told by your health care provider. This is important. How is this prevented?  Wear shoes that give your toes enough room and do not cause pain.  Do not wear high-heeled shoes. Contact a health care provider if:  Your pain gets worse.  Your toe becomes red or swollen.  You develop an open sore on your toe. This information is not intended to replace advice given to you by your health care provider. Make sure you discuss any questions you have with your health care  provider. Document Released: 02/01/2000 Document Revised: 08/24/2015 Document Reviewed: 05/30/2015 Elsevier Interactive Patient Education  2018 [employer]  

## 2017-05-06 NOTE — Progress Notes (Signed)
Subjective:   Patient ID: Adam Ware, Dr., male   DOB: 67 y.o.   MRN: [id number]   HPI Patient presents concerned about abnormal position of the second digit left foot stating he had an injury while playing golf approximately 15 years ago and the toe moved in a dorsal medial direction   Review of Systems  All other systems reviewed and are negative.       Objective:  Physical Exam  Constitutional: He appears well-developed and well-nourished.  Cardiovascular: Intact distal pulses.  Pulmonary/Chest: Effort normal.  Musculoskeletal: Normal range of motion.  Neurological: He is alert.  Skin: Skin is warm.  Nursing note and vitals reviewed.   Neurovascular status intact muscle strength adequate range of motion within normal limits with patient noted to have a medial dorsal deviation second digit left with moderate prominence of the second metatarsal head left that he utilizes orthotics for     Assessment:  Probability for previous flexor plate injury leading to dorsal medial dislocation of the MPJ left     Plan:  H&P condition reviewed and long-term recommended digital fusion and metatarsal osteotomy if symptoms were to worsen.  At this time will use silicone capping for the second digit to protect the toe  X-rays indicate medial dorsal dislocation second digit left at the metatarsal phalangeal joint

## 2017-05-13 ENCOUNTER — Ambulatory Visit (INDEPENDENT_AMBULATORY_CARE_PROVIDER_SITE_OTHER): Payer: PPO

## 2017-05-13 ENCOUNTER — Encounter (INDEPENDENT_AMBULATORY_CARE_PROVIDER_SITE_OTHER): Payer: Self-pay | Admitting: Physical Medicine and Rehabilitation

## 2017-05-13 ENCOUNTER — Ambulatory Visit (INDEPENDENT_AMBULATORY_CARE_PROVIDER_SITE_OTHER): Payer: PPO | Admitting: Physical Medicine and Rehabilitation

## 2017-05-13 VITALS — BP 124/80 | HR 81 | Temp 97.7°F

## 2017-05-13 DIAGNOSIS — M5416 Radiculopathy, lumbar region: Secondary | ICD-10-CM

## 2017-05-13 DIAGNOSIS — M4317 Spondylolisthesis, lumbosacral region: Secondary | ICD-10-CM | POA: Diagnosis not present

## 2017-05-13 DIAGNOSIS — Q762 Congenital spondylolisthesis: Secondary | ICD-10-CM | POA: Diagnosis not present

## 2017-05-13 MED ORDER — AMITRIPTYLINE HCL 10 MG PO TABS: 10.0000 mg | ORAL_TABLET | Freq: Every day | ORAL | 1 refills | Status: DC

## 2017-05-13 MED ORDER — BETAMETHASONE SOD PHOS & ACET 6 (3-3) MG/ML IJ SUSP
12.0000 mg | Freq: Once | INTRAMUSCULAR | Status: AC
  Administered 2017-05-13: 12 mg

## 2017-05-13 MED FILL — AMITRIPTYLINE HCL 10 MG TAB: 10 | 30 days supply | Qty: 90 | Fill #0

## 2017-05-13 NOTE — Progress Notes (Signed)
Adam Ware, Adam Ware MRN [id number]  Date of birth: 09-Sep-1950  Office Visit Note: Visit Date: 05/13/2017 PCP: Adam Cookey, MD Referred by: Adam Cookey, MD  Subjective: Chief Complaint  Patient presents with  . Lower Back - Pain  . Right Leg - Pain  . 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  He has a history of right-sided pars defect and listhesis which she is known about for many years.  He reports that about a month ago he began having a sharp pain with really severe spasm in the right lower back and buttock region.  He reports this is worse with walking for a length of time and is better with sitting and finding a good position.  He has been having difficulty at work with having to stand and walk to a different patient rooms.  He is actually been using a scooter at times.  He does get referral pattern and a pretty classic L5 distribution down to the big toe.  He gets some tingling paresthesia.  His main pain is in the right buttock area and posterior lateral hamstring.  He also gets quite a bit of stabbing pain really at the lumbosacral junction on the right.  He is reported some mild weakness with heel walking with a EHL and dorsiflexion.  He is not really having much symptoms on the left.  MRI of the lumbar spine was performed and is reviewed in entirety below and reviewed with the patient today.  We are going to complete a diagnostic and hopefully therapeutic right L5 transforaminal epidural steroid injection.  Depending on relief would look at either repeating versus trying a different approach.  We did talk about stretching of his hamstrings as well as stretching of his deeper multipara for the quadratus musculature.  We have told him right now to probably not pursue playing golf so much right at the moment and try to see if  we can get this to calm down.  I do think she will ultimately get relief over time.  He does have enough compression in the foramen noted consider surgical evaluation if she is safe to use.  We also talked about the use of tertiary try cyclic medication for neuropathic pain.  I did write a prescription for 10 mg of Elavil.  He can increase this with tolerance up to 30 mg at night.  He is having some difficulty sleeping just because of trying to get comfortable.   ROS Otherwise per HPI.  Assessment & Plan: Visit Diagnoses:  1. Lumbar radiculopathy   2. Spondylolisthesis of lumbosacral region   3. Congenital spondylolysis     Plan: No additional findings.   Meds & Orders:  Meds ordered this encounter  Medications  . betamethasone acetate-betamethasone sodium phosphate (CELESTONE) injection 12 mg  . amitriptyline (ELAVIL) 10 MG tablet    Sig: Take 1 tablet (10 mg total) by mouth at bedtime. Can increase by 1 tab as tolerated up to 3.    Dispense:  90 tablet    Refill:  1    Orders Placed This Encounter  Procedures  . XR C-ARM NO REPORT  . Epidural Steroid injection    Follow-up: Return if symptoms worsen or fail to improve.   Procedures: No procedures performed  Lumbosacral Transforaminal Epidural  Steroid Injection - Sub-Pedicular Approach with Fluoroscopic Guidance  Patient: Adam Ware, Dr.      Date of Birth: 1950/03/18 MRN: [id number] PCP: Adam Cookey, MD      Visit Date: 05/13/2017   Universal Protocol:    Date/Time: 05/13/2017  Consent Ware By: the patient  Position: PRONE  Additional Comments: Vital signs were monitored before and after the procedure. Patient was prepped and draped in the usual sterile fashion. The correct patient, procedure, and site was verified.   Injection Procedure Details:  Procedure Site One Meds Administered:  Meds ordered this encounter  Medications  . betamethasone acetate-betamethasone sodium phosphate (CELESTONE) injection  12 mg    Laterality: Right  Location/Site:  L5-S1  Needle size: 22 G  Needle type: Spinal  Needle Placement: Transforaminal  Findings:    -Comments: Excellent flow of contrast along the nerve and into the epidural space.  Procedure Details: After squaring off the end-plates to get a true AP view, the C-arm was positioned so that an oblique view of the foramen as noted above was visualized. The target area is just inferior to the "nose of the scotty dog" or sub pedicular. The soft tissues overlying this structure were infiltrated with 2-3 ml. of 1% Lidocaine without Epinephrine.  The spinal needle was inserted toward the target using a "trajectory" view along the fluoroscope beam.  Under AP and lateral visualization, the needle was advanced so it did not puncture dura and was located close the 6 O'Clock position of the pedical in AP tracterory. Biplanar projections were used to confirm position. Aspiration was confirmed to be negative for CSF and/or blood. A 1-2 ml. volume of Isovue-250 was injected and flow of contrast was noted at each level. Radiographs were obtained for documentation purposes.   After attaining the desired flow of contrast documented above, a 0.5 to 1.0 ml test dose of 0.25% Marcaine was injected into each respective transforaminal space.  The patient was observed for 90 seconds post injection.  After no sensory deficits were reported, and normal lower extremity motor function was noted,   the above injectate was administered so that equal amounts of the injectate were placed at each foramen (level) into the transforaminal epidural space.   Additional Comments:  The patient tolerated the procedure well. He had significant spasm and nerve pain with instillation/delivery of Injectate but was normal considerate level of stenosis in the foramen. Dressing: Band-Aid    Post-procedure details: Patient was observed during the procedure. Post-procedure instructions were  reviewed.  Patient left the clinic in stable condition.    Clinical History: MRI LUMBAR SPINE WITHOUT CONTRAST  TECHNIQUE: Multiplanar, multisequence MR imaging of the lumbar spine was performed. No intravenous contrast was administered.  COMPARISON: Lumbar spine radiographs 04/21/2017. CT abdomen and pelvis 03/25/2014.  FINDINGS: Segmentation: Standard.  Alignment: 8 mm anterolisthesis of L5 on S1 due to bilateral L5 pars defects.  Vertebrae: No acute fracture or suspicious osseous lesion. Mild left-sided L4 inferior endplate edema, likely degenerative with a small Schmorl's node also present in this location.  Conus medullaris and cauda equina: Conus extends to the L1-2 level. Conus and cauda equina appear normal.  Paraspinal and other soft tissues: Unremarkable.  Disc levels:  L1-2: Negative.  L2-3: Mild disc desiccation. Minimal disc bulging without stenosis.  L3-4: Mild disc desiccation. Minimal disc bulging and mild facet arthrosis without stenosis.  L4-5: Mild disc desiccation. Mild disc bulging and moderate facet hypertrophy result in mild left neural foraminal stenosis without  spinal stenosis. A 5 x 1 mm cyst is noted along the left ligamentum flavum, not resulting in neural impingement.  L5-S1: Disc desiccation and severe disc space narrowing. Anterolisthesis, disc space height loss, and bulging uncovered disc result in severe bilateral neural foraminal stenosis with L5 nerve root compression. No spinal stenosis.  IMPRESSION: 1. Bilateral L5 pars defects with grade 1 anterolisthesis, advanced disc degeneration, and severe bilateral neural foraminal stenosis with L5 nerve root compression. 2. Mild disc bulging and moderate facet hypertrophy at L4-5 resulting in mild left neural foraminal stenosis.   Electronically Signed By: Logan Bores M.D. On: 05/01/2017 17:09   He reports that he has never smoked. He has never used smokeless tobacco.  Recent  Labs    04/20/17 0856  HGBA1C 6.2*    Objective:  VS:  HT:    WT:   BMI:     BP:124/80  HR:81bpm  TEMP:97.7 F (36.5 C)(Oral)  RESP:99 % Physical Exam  Musculoskeletal:  Patient ambulates without aid with somewhat of an antalgic gait to the right.  He has some pain with extension of the lumbar spine.  Very tight hamstring on the right with essentially positive equivocally positive slump test.  He does have some weakness with dorsiflexion gentleman right compared to left.    Ortho Exam Imaging: No results found.  Past Medical/Family/Surgical/Social History: Medications & Allergies reviewed per EMR, new medications updated. Patient Active Problem List   Diagnosis Date Noted  . Status post revision of total replacement of right knee 04/22/2017  . Failed total knee, right, sequela   . Spondylolisthesis, lumbar region 04/21/2017   Past Medical History:  Diagnosis Date  . Arthritis   . Diabetes mellitus without complication ([place])   . Hair loss   . History of kidney stones   . Hyperlipidemia    Family History  Problem Relation Age of Onset  . Cancer Mother   . Diabetes Mother   . Mental illness Mother   . Heart disease Father   . Arthritis Brother    Past Surgical History:  Procedure Laterality Date  . APPENDECTOMY    . JOINT REPLACEMENT    . LITHOTRIPSY    . REPLACEMENT TOTAL KNEE Right   . TOTAL KNEE REVISION Right 04/22/2017   Procedure: REVISION RIGHT TOTAL KNEE ARTHROPLASTY VS. POLY EXCHANGE;  Surgeon: Newt Minion, MD;  Location: [facility];  Service: Orthopedics;  Laterality: Right;   Social History   Occupational History  . Not on file  Tobacco Use  . Smoking status: Never Smoker  . Smokeless tobacco: Never Used  Substance and Sexual Activity  . Alcohol use: Yes  . Drug use: No  . Sexual activity: Not on file

## 2017-05-13 NOTE — Procedures (Signed)
Lumbosacral Transforaminal Epidural Steroid Injection - Sub-Pedicular Approach with Fluoroscopic Guidance  Patient: Adam Ware, Dr.      Date of Birth: 01-Sep-1950 MRN: [id number] PCP: Dorena Cookey, MD      Visit Date: 05/13/2017   Universal Protocol:    Date/Time: 05/13/2017  Consent Given By: the patient  Position: PRONE  Additional Comments: Vital signs were monitored before and after the procedure. Patient was prepped and draped in the usual sterile fashion. The correct patient, procedure, and site was verified.   Injection Procedure Details:  Procedure Site One Meds Administered:  Meds ordered this encounter  Medications  . betamethasone acetate-betamethasone sodium phosphate (CELESTONE) injection 12 mg    Laterality: Right  Location/Site:  L5-S1  Needle size: 22 G  Needle type: Spinal  Needle Placement: Transforaminal  Findings:    -Comments: Excellent flow of contrast along the nerve and into the epidural space.  Procedure Details: After squaring off the end-plates to get a true AP view, the C-arm was positioned so that an oblique view of the foramen as noted above was visualized. The target area is just inferior to the "nose of the scotty dog" or sub pedicular. The soft tissues overlying this structure were infiltrated with 2-3 ml. of 1% Lidocaine without Epinephrine.  The spinal needle was inserted toward the target using a "trajectory" view along the fluoroscope beam.  Under AP and lateral visualization, the needle was advanced so it did not puncture dura and was located close the 6 O'Clock position of the pedical in AP tracterory. Biplanar projections were used to confirm position. Aspiration was confirmed to be negative for CSF and/or blood. A 1-2 ml. volume of Isovue-250 was injected and flow of contrast was noted at each level. Radiographs were obtained for documentation purposes.   After attaining the desired flow of contrast documented above, a 0.5  to 1.0 ml test dose of 0.25% Marcaine was injected into each respective transforaminal space.  The patient was observed for 90 seconds post injection.  After no sensory deficits were reported, and normal lower extremity motor function was noted,   the above injectate was administered so that equal amounts of the injectate were placed at each foramen (level) into the transforaminal epidural space.   Additional Comments:  The patient tolerated the procedure well. He had significant spasm and nerve pain with instillation/delivery of Injectate but was normal considerate level of stenosis in the foramen. Dressing: Band-Aid    Post-procedure details: Patient was observed during the procedure. Post-procedure instructions were reviewed.  Patient left the clinic in stable condition.

## 2017-05-13 NOTE — Progress Notes (Signed)
  Numeric Pain Rating Scale and Functional Assessment Average Pain 5   In the last MONTH (on 0-10 scale) has pain interfered with the following?  1. General activity like being  able to carry out your everyday physical activities such as walking, climbing stairs, carrying groceries, or moving a chair?  Rating(7)   +Driver, -BT, -Dye Allergies. 

## 2017-05-13 NOTE — Patient Instructions (Signed)

## 2017-05-18 ENCOUNTER — Encounter: Payer: Self-pay | Admitting: Family Medicine

## 2017-05-19 ENCOUNTER — Telehealth (INDEPENDENT_AMBULATORY_CARE_PROVIDER_SITE_OTHER): Payer: Self-pay | Admitting: Physical Medicine and Rehabilitation

## 2017-05-19 NOTE — Telephone Encounter (Signed)
Yes she needs another injection and I would repeat the same.  Hopefully with some understanding of how it goes now we will get a little bit better flow of contrast some of the other would look fairly good.

## 2017-05-19 NOTE — Telephone Encounter (Signed)
Worked in for 2 week repeat on 05/27/17.

## 2017-05-20 ENCOUNTER — Encounter: Payer: PPO | Admitting: Family Medicine

## 2017-05-27 ENCOUNTER — Ambulatory Visit (INDEPENDENT_AMBULATORY_CARE_PROVIDER_SITE_OTHER): Payer: PPO

## 2017-05-27 ENCOUNTER — Ambulatory Visit (INDEPENDENT_AMBULATORY_CARE_PROVIDER_SITE_OTHER): Payer: PPO | Admitting: Physical Medicine and Rehabilitation

## 2017-05-27 ENCOUNTER — Encounter (INDEPENDENT_AMBULATORY_CARE_PROVIDER_SITE_OTHER): Payer: Self-pay | Admitting: Physical Medicine and Rehabilitation

## 2017-05-27 VITALS — BP 137/86 | HR 90 | Temp 97.7°F

## 2017-05-27 DIAGNOSIS — Q762 Congenital spondylolisthesis: Secondary | ICD-10-CM

## 2017-05-27 DIAGNOSIS — M5416 Radiculopathy, lumbar region: Secondary | ICD-10-CM | POA: Diagnosis not present

## 2017-05-27 DIAGNOSIS — M4317 Spondylolisthesis, lumbosacral region: Secondary | ICD-10-CM | POA: Diagnosis not present

## 2017-05-27 MED ORDER — BETAMETHASONE SOD PHOS & ACET 6 (3-3) MG/ML IJ SUSP
12.0000 mg | Freq: Once | INTRAMUSCULAR | Status: AC
  Administered 2017-05-27: 12 mg

## 2017-05-27 NOTE — Progress Notes (Signed)
.  Numeric Pain Rating Scale and Functional Assessment Average Pain 3   In the last MONTH (on 0-10 scale) has pain interfered with the following?  1. General activity like being  able to carry out your everyday physical activities such as walking, climbing stairs, carrying groceries, or moving a chair?  Rating(0)   +Driver, -BT, -Dye Allergies. 

## 2017-05-27 NOTE — Patient Instructions (Signed)

## 2017-05-28 NOTE — Progress Notes (Signed)
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 Lower Back - Pain   HPI: Adam Ware is a 67 year old gentleman with right radicular leg pain and a pretty classic L5 distribution.  He reports since the prior right L5 transforaminal injection about 75% relief overall.  He still gets paresthesia but is less.  The worst time is in the morning and with going from sit to walking with pretty significant right buttock pain.  He decided not to really take the amitriptyline.  We had discussion about Lyrica and other medications.  We are going to see how the second injection does and then we can make appropriate recommendation for medication which I do think Lyrica would be a good choice depending on approval etc.  I think with time he is going to see some relief of his symptoms and usually the last thing to get better is going to be the paresthesia and weakness.  He does have some dorsiflexion EHL weakness on the right.  He has some difficulty with heel walking but is not progressive.   ROS Otherwise per HPI.  Assessment & Plan: Visit Diagnoses:  1. Lumbar radiculopathy   2. Spondylolisthesis of lumbosacral region   3. Congenital spondylolysis     Plan: Findings:  Right L5 transforaminal epidural steroid injection.  Patient clearly having less pain and discomfort with this injection compared to the first.    Meds & Orders:  Meds ordered this encounter  Medications  . betamethasone acetate-betamethasone sodium phosphate (CELESTONE) injection 12 mg    Orders Placed This Encounter  Procedures  . XR C-ARM NO REPORT  . Epidural Steroid injection    Follow-up: Return if symptoms worsen or fail to improve.   Procedures: No procedures performed  Lumbosacral Transforaminal Epidural Steroid Injection -  Sub-Pedicular Approach with Fluoroscopic Guidance  Patient: Adam Ware, Dr.  
      Date of Birth: 1950-06-01 MRN: [id number] PCP: Dorena Cookey, MD      Visit Date: 05/27/2017      Date of Birth: 1950-06-01 MRN: [id number] PCP: Dorena Cookey, MD      Visit Date: 05/27/2017   Universal Protocol:    Date/Time: 05/27/2017  Consent Given By: the patient  Position: PRONE  Additional Comments: Vital signs were monitored before and after the procedure. Patient was prepped and draped in the usual sterile fashion. The correct patient, procedure, and site was verified.   Injection Procedure Details:  Procedure Site One Meds Administered:  Meds ordered this encounter  Medications  . betamethasone acetate-betamethasone sodium phosphate (CELESTONE) injection 12 mg    Laterality: Right  Location/Site:  L5-S1  Needle size: 22 G  Needle type: Spinal  Needle Placement: Transforaminal  Findings:    -Comments: Excellent flow of contrast along the nerve and into the epidural space.  Procedure Details: After squaring off the end-plates to get a true AP view, the C-arm was positioned so that an oblique view of the foramen as noted above was visualized. The target area is just inferior to the "nose of the scotty dog" or sub pedicular. The soft tissues overlying this structure were infiltrated with 2-3 ml. of 1% Lidocaine without Epinephrine.  The spinal needle was inserted toward the target using a "trajectory" view along the fluoroscope beam.  Under AP and lateral visualization, the needle was advanced so it did not puncture dura and was located close the 6 O'Clock position of the pedical  in AP tracterory. Biplanar projections were used to confirm position. Aspiration was confirmed to be negative for CSF and/or blood. A 1-2 ml. volume of Isovue-250 was injected and flow of contrast was noted at each level. Radiographs were obtained for documentation purposes.   After attaining the desired flow of contrast documented above, a 0.5 to 1.0 ml test dose of 0.25% Marcaine was injected into  each respective transforaminal space.  The patient was observed for 90 seconds post injection.  After no sensory deficits were reported, and normal lower extremity motor function was noted,   the above injectate was administered so that equal amounts of the injectate were placed at each foramen (level) into the transforaminal epidural space.   Additional Comments:  The patient tolerated the procedure well Dressing: Band-Aid    Post-procedure details: Patient was observed during the procedure. Post-procedure instructions were reviewed.  Patient left the clinic in stable condition.    Clinical History: MRI LUMBAR SPINE WITHOUT CONTRAST  TECHNIQUE: Multiplanar, multisequence MR imaging of the lumbar spine was performed. No intravenous contrast was administered.  COMPARISON: Lumbar spine radiographs 04/21/2017. CT abdomen and pelvis 03/25/2014.  FINDINGS: Segmentation: Standard.  Alignment: 8 mm anterolisthesis of L5 on S1 due to bilateral L5 pars defects.  Vertebrae: No acute fracture or suspicious osseous lesion. Mild left-sided L4 inferior endplate edema, likely degenerative with a small Schmorl's node also present in this location.  Conus medullaris and cauda equina: Conus extends to the L1-2 level. Conus and cauda equina appear normal.  Paraspinal and other soft tissues: Unremarkable.  Disc levels:  L1-2: Negative.  L2-3: Mild disc desiccation. Minimal disc bulging without stenosis.  L3-4: Mild disc desiccation. Minimal disc bulging and mild facet arthrosis without stenosis.  L4-5: Mild disc desiccation. Mild disc bulging and moderate facet hypertrophy result in mild left neural foraminal stenosis without spinal stenosis. A 5 x 1 mm cyst is noted along the left ligamentum flavum, not resulting in neural impingement.  L5-S1: Disc desiccation and severe disc space narrowing. Anterolisthesis, disc space height loss, and bulging uncovered disc result in severe  bilateral neural foraminal stenosis with L5 nerve root compression. No spinal stenosis.  IMPRESSION: 1. Bilateral L5 pars defects with grade 1 anterolisthesis, advanced disc degeneration, and severe bilateral neural foraminal stenosis with L5 nerve root compression. 2. Mild disc bulging and moderate facet hypertrophy at L4-5 resulting in mild left neural foraminal stenosis.   Electronically Signed By: Logan Bores M.D. On: 05/01/2017 17:09   He reports that he has never smoked. He has never used smokeless tobacco.  Recent Labs    04/20/17 0856  HGBA1C 6.2*    Objective:  VS:  HT:    WT:   BMI:     BP:137/86  HR:90bpm  TEMP:97.7 F (36.5 C)(Oral)  RESP:97 % Physical Exam  Ortho Exam Imaging: Xr C-arm No Report  Result Date: 05/27/2017 Please see Notes or Procedures tab for imaging impression.   Past Medical/Family/Surgical/Social History: Medications & Allergies reviewed per EMR, new medications updated. Patient Active Problem List   Diagnosis Date Noted  . Status post revision of total replacement of right knee 04/22/2017  . Failed total knee, right, sequela   . Spondylolisthesis, lumbar region 04/21/2017   Past Medical History:  Diagnosis Date  . Arthritis   . Diabetes mellitus without complication ([place])   . Hair loss   . History of kidney stones   . Hyperlipidemia    Family History  Problem Relation Age of Onset  .  Cancer Mother   . Diabetes Mother   . Mental illness Mother   . Heart disease Father   . Arthritis Brother    Past Surgical History:  Procedure Laterality Date  . APPENDECTOMY    . JOINT REPLACEMENT    . LITHOTRIPSY    . REPLACEMENT TOTAL KNEE Right   . TOTAL KNEE REVISION Right 04/22/2017   Procedure: REVISION RIGHT TOTAL KNEE ARTHROPLASTY VS. POLY EXCHANGE;  Surgeon: Newt Minion, MD;  Location: [facility];  Service: Orthopedics;  Laterality: Right;   Social History   Occupational History  . Not on file  Tobacco Use  . Smoking  status: Never Smoker  . Smokeless tobacco: Never Used  Substance and Sexual Activity  . Alcohol use: Yes  . Drug use: No  . Sexual activity: Not on file

## 2017-05-28 NOTE — Procedures (Signed)
Lumbosacral Transforaminal Epidural Steroid Injection - Sub-Pedicular Approach with Fluoroscopic Guidance  Patient: Adam Ware, Dr.      Date of Birth: 11-May-1950 MRN: [id number] PCP: Dorena Cookey, MD      Visit Date: 05/27/2017   Universal Protocol:    Date/Time: 05/27/2017  Consent Given By: the patient  Position: PRONE  Additional Comments: Vital signs were monitored before and after the procedure. Patient was prepped and draped in the usual sterile fashion. The correct patient, procedure, and site was verified.   Injection Procedure Details:  Procedure Site One Meds Administered:  Meds ordered this encounter  Medications  . betamethasone acetate-betamethasone sodium phosphate (CELESTONE) injection 12 mg    Laterality: Right  Location/Site:  L5-S1  Needle size: 22 G  Needle type: Spinal  Needle Placement: Transforaminal  Findings:    -Comments: Excellent flow of contrast along the nerve and into the epidural space.  Procedure Details: After squaring off the end-plates to get a true AP view, the C-arm was positioned so that an oblique view of the foramen as noted above was visualized. The target area is just inferior to the "nose of the scotty dog" or sub pedicular. The soft tissues overlying this structure were infiltrated with 2-3 ml. of 1% Lidocaine without Epinephrine.  The spinal needle was inserted toward the target using a "trajectory" view along the fluoroscope beam.  Under AP and lateral visualization, the needle was advanced so it did not puncture dura and was located close the 6 O'Clock position of the pedical in AP tracterory. Biplanar projections were used to confirm position. Aspiration was confirmed to be negative for CSF and/or blood. A 1-2 ml. volume of Isovue-250 was injected and flow of contrast was noted at each level. Radiographs were obtained for documentation purposes.   After attaining the desired flow of contrast documented above, a 0.5  to 1.0 ml test dose of 0.25% Marcaine was injected into each respective transforaminal space.  The patient was observed for 90 seconds post injection.  After no sensory deficits were reported, and normal lower extremity motor function was noted,   the above injectate was administered so that equal amounts of the injectate were placed at each foramen (level) into the transforaminal epidural space.   Additional Comments:  The patient tolerated the procedure well Dressing: Band-Aid    Post-procedure details: Patient was observed during the procedure. Post-procedure instructions were reviewed.  Patient left the clinic in stable condition.

## 2017-06-10 ENCOUNTER — Ambulatory Visit (INDEPENDENT_AMBULATORY_CARE_PROVIDER_SITE_OTHER): Payer: PPO | Admitting: Physical Medicine and Rehabilitation

## 2017-06-10 ENCOUNTER — Encounter: Payer: Self-pay | Admitting: Family Medicine

## 2017-06-10 ENCOUNTER — Ambulatory Visit (INDEPENDENT_AMBULATORY_CARE_PROVIDER_SITE_OTHER): Payer: PPO

## 2017-06-10 ENCOUNTER — Encounter (INDEPENDENT_AMBULATORY_CARE_PROVIDER_SITE_OTHER): Payer: Self-pay | Admitting: Physical Medicine and Rehabilitation

## 2017-06-10 ENCOUNTER — Ambulatory Visit (INDEPENDENT_AMBULATORY_CARE_PROVIDER_SITE_OTHER): Payer: PPO | Admitting: Family Medicine

## 2017-06-10 VITALS — BP 126/82 | HR 78 | Temp 98.4°F | Ht 73.0 in | Wt 212.0 lb

## 2017-06-10 VITALS — BP 133/87

## 2017-06-10 DIAGNOSIS — Z0000 Encounter for general adult medical examination without abnormal findings: Secondary | ICD-10-CM | POA: Diagnosis not present

## 2017-06-10 DIAGNOSIS — E785 Hyperlipidemia, unspecified: Secondary | ICD-10-CM

## 2017-06-10 DIAGNOSIS — M5416 Radiculopathy, lumbar region: Secondary | ICD-10-CM | POA: Diagnosis not present

## 2017-06-10 DIAGNOSIS — I10 Essential (primary) hypertension: Secondary | ICD-10-CM | POA: Insufficient documentation

## 2017-06-10 DIAGNOSIS — E1169 Type 2 diabetes mellitus with other specified complication: Secondary | ICD-10-CM | POA: Insufficient documentation

## 2017-06-10 DIAGNOSIS — Q762 Congenital spondylolisthesis: Secondary | ICD-10-CM

## 2017-06-10 DIAGNOSIS — E119 Type 2 diabetes mellitus without complications: Secondary | ICD-10-CM | POA: Diagnosis not present

## 2017-06-10 DIAGNOSIS — M4317 Spondylolisthesis, lumbosacral region: Secondary | ICD-10-CM

## 2017-06-10 MED ORDER — LISINOPRIL 10 MG PO TABS: 10.0000 mg | ORAL_TABLET | Freq: Every day | ORAL | 4 refills | Status: DC

## 2017-06-10 MED ORDER — BETAMETHASONE SOD PHOS & ACET 6 (3-3) MG/ML IJ SUSP
12.0000 mg | Freq: Once | INTRAMUSCULAR | Status: AC
  Administered 2017-06-10: 12 mg

## 2017-06-10 MED ORDER — METFORMIN HCL 500 MG PO TABS: 500.0000 mg | ORAL_TABLET | Freq: Every day | ORAL | 4 refills | Status: DC

## 2017-06-10 MED FILL — metFORMIN HCL 500 MG TABS: 500 | 90 days supply | Qty: 90 | Fill #0

## 2017-06-10 NOTE — Patient Instructions (Addendum)
Continue current medications  Follow-up A1c, bemet and microalbumin......... third week in August  Return in one year for general physical exam sooner if any problems.  Monitor blood pressure  as we discussed. BP goal 130/80......... if we don't see you BP at goal over the next couple weeks let's bump up the lisinopril

## 2017-06-10 NOTE — Progress Notes (Signed)
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 Last A1c in March of this year was 6.2%.  About 6 weeks ago his blood pressure went up and we started him on lisinopril 5 mg daily. That did not get his blood pressure goal therefore we increased it to 10 mg daily. BP here today with his automated cuff is 146/87. With the manual cuff it was 126/82. We elected to maintain the lisinopril 10 and have him monitor his blood pressure over the next couple weeks. If blood pressure does not achieve goal 130/80 or less We will increase the lisinopril.  He takes Lipitor 10 mg Monday Wednesday Friday along with Lovaza 1 g twice a day for hyperlipidemia. Last LDL was 63 with an HDL 33 total cholesterol 150 03/23/2017.  Renal function shows a creatinine of 0.9.  He had a PSA a couple years ago was normal we discussed screening PSA he declined  He takes Proscar 5 mg daily for hair growth  He takes Lunesta 2 mg daily at bedtime when necessary for sleep.  He was on Zanaflex 2 mg twice a day because of pain in his back. Today he's going for his third epidural spinal injection. MRI shows a lateral stenosis L5. He also has stenosis on the left L5 however he's asymptomatic on that side.  Vaccinations up-to-date including the new shingles vaccine 2  He gets routine eye care, dental care, colonoscopy 2011 was normal.  He had his right knee redone about 8 weeks ago. The plastic on the hardware had failed. He did well and had no complications. He is able to resume his normal activities including playing golf.  Social history..... 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 Very active playing golf. 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 Cardiopulmonary exam normal abdominal exam normal peripheral pulses normal skin normal. Neurologic he does admit some tingling in his right great toe from the L5 radiculopathy. Otherwise no diabetic neuropathy.  #1 healthy male  #2 diabetes type 2 at goal........ continue current therapy  #3 hyperlipidemia goal........ continue current therapy  #4 hypertension..........Marland Kitchen most likely at goal...... increase the lisinopril as noted above to 10 mg daily..... Monitor blood pressure increase if not at goal 130/80 or less  #5 sleep dysfunction.......Marland Kitchen Lunesta when necessary  #6 right L5 spinal stenosis.......... 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

## 2017-06-10 NOTE — Progress Notes (Signed)
 .  Numeric Pain Rating Scale and Functional Assessment Average Pain 3   In the last MONTH (on 0-10 scale) has pain interfered with the following?  1. General activity like being  able to carry out your everyday physical activities such as walking, climbing stairs, carrying groceries, or moving a chair?  Rating(0)   +Driver, -BT, -Dye Allergies. 

## 2017-06-10 NOTE — Patient Instructions (Signed)

## 2017-06-11 NOTE — Procedures (Signed)
Lumbosacral Transforaminal Epidural Steroid Injection - Sub-Pedicular Approach with Fluoroscopic Guidance  Patient: Adam Ware, Dr.      Date of Birth: 06-25-1950 MRN: [id number] PCP: Dorena Cookey, MD      Visit Date: 06/10/2017   Universal Protocol:    Date/Time: 06/10/2017  Consent Given By: the patient  Position: PRONE  Additional Comments: Vital signs were monitored before and after the procedure. Patient was prepped and draped in the usual sterile fashion. The correct patient, procedure, and site was verified.   Injection Procedure Details:  Procedure Site One Meds Administered:  Meds ordered this encounter  Medications  . betamethasone acetate-betamethasone sodium phosphate (CELESTONE) injection 12 mg    Laterality: Right  Location/Site:  L5-S1  Needle size: 22 G  Needle type: Spinal  Needle Placement: Transforaminal  Findings:    -Comments: Excellent flow of contrast along the nerve and into the epidural space.  Procedure Details: After squaring off the end-plates to get a true AP view, the C-arm was positioned so that an oblique view of the foramen as noted above was visualized. The target area is just inferior to the "nose of the scotty dog" or sub pedicular. The soft tissues overlying this structure were infiltrated with 2-3 ml. of 1% Lidocaine without Epinephrine.  The spinal needle was inserted toward the target using a "trajectory" view along the fluoroscope beam.  Under AP and lateral visualization, the needle was advanced so it did not puncture dura and was located close the 6 O'Clock position of the pedical in AP tracterory. Biplanar projections were used to confirm position. Aspiration was confirmed to be negative for CSF and/or blood. A 1-2 ml. volume of Isovue-250 was injected and flow of contrast was noted at each level. Radiographs were obtained for documentation purposes.   After attaining the desired flow of contrast documented above, a 0.5  to 1.0 ml test dose of 0.25% Marcaine was injected into each respective transforaminal space.  The patient was observed for 90 seconds post injection.  After no sensory deficits were reported, and normal lower extremity motor function was noted,   the above injectate was administered so that equal amounts of the injectate were placed at each foramen (level) into the transforaminal epidural space.   Additional Comments:  The patient tolerated the procedure well Dressing: Band-Aid    Post-procedure details: Patient was observed during the procedure. Post-procedure instructions were reviewed.  Patient left the clinic in stable condition.

## 2017-06-11 NOTE — Progress Notes (Signed)
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 Lower Back - Pain  . 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  He has gotten successive relief with these injections.  Overall he is doing much better than when we first saw him.  He reports his average pain is a 3 but he still getting significant functional issues with trying to walk any distance.  He gets a lot of pain in the right buttocks and then tingling into the right toe.  No real changes since we last saw him.  He has had some weakness with dorsiflexion EHL on the right but this is not been progressive.  His history is complicated with diabetes.  At this point I think repeating the injection for 1/3 injection is worthwhile as he is gotten success of relief each time.  We have discussed the issue of going forward with how much will be able to tolerate and see how this goes for a while.  A lot of this will diminish over time although it does not seem like it it first when it is really problematic.  I think it would be wise for him at some point to seek out information from a spine surgeon rather as an orthopedic spine surgeon or neurosurgeon for at least options in terms of possibly a minimally invasive type procedure.  He is going to continue with stretching and conservative care.  He could regroup with physical therapy for the buttock pain at this point and see if again dry needling may be of benefit.  We have discussed the use of medication such as Lyrica and try cyclic antidepressants from a nerve pain standpoint.  He really defers those at this point.  One  final note would be interlaminar epidural steroid injection from a different standpoint is to see if may be more medication in the lateral recess area could benefit him although he has mostly foraminal entrapment.   ROS Otherwise per HPI.  Assessment & Plan: Visit Diagnoses:  1. Lumbar radiculopathy   2. Spondylolisthesis of lumbosacral region   3. Congenital spondylolysis     Plan: No additional findings.   Meds & Orders:  Meds ordered this encounter  Medications  . betamethasone acetate-betamethasone sodium phosphate (CELESTONE) injection 12 mg    Orders Placed This Encounter  Procedures  . XR C-ARM NO REPORT  . Epidural Steroid injection    Follow-up: Return if symptoms worsen or fail to improve.   Procedures: No procedures performed  Lumbosacral Transforaminal Epidural Steroid Injection - Sub-Pedicular Approach with Fluoroscopic Guidance  Patient: Adam Ware, Dr.      Date of Birth: 02-Jan-1951 MRN: [id number] PCP: Dorena Cookey, MD      Visit Date: 06/10/2017   Universal Protocol:    Date/Time: 06/10/2017  Consent Given By: the patient  Position: PRONE  Additional Comments: Vital signs were monitored before and after the procedure. Patient was prepped and draped in the usual sterile fashion. The correct patient, procedure, and site was verified.   Injection Procedure Details:  Procedure Site One Meds Administered:  Meds ordered this encounter  Medications  . betamethasone acetate-betamethasone sodium phosphate (CELESTONE) injection 12 mg    Laterality: Right  Location/Site:  L5-S1  Needle size: 22 G  Needle type: Spinal  Needle Placement: Transforaminal  Findings:    -Comments: Excellent flow of contrast along the nerve and into the epidural space.  Procedure Details: After squaring off the end-plates to get a true AP view, the C-arm was positioned so that an oblique view of the foramen as noted above was visualized. The target area is  just inferior to the "nose of the scotty dog" or sub pedicular. The soft tissues overlying this structure were infiltrated with 2-3 ml. of 1% Lidocaine without Epinephrine.  The spinal needle was inserted toward the target using a "trajectory" view along the fluoroscope beam.  Under AP and lateral visualization, the needle was advanced so it did not puncture dura and was located close the 6 O'Clock position of the pedical in AP tracterory. Biplanar projections were used to confirm position. Aspiration was confirmed to be negative for CSF and/or blood. A 1-2 ml. volume of Isovue-250 was injected and flow of contrast was noted at each level. Radiographs were obtained for documentation purposes.   After attaining the desired flow of contrast documented above, a 0.5 to 1.0 ml test dose of 0.25% Marcaine was injected into each respective transforaminal space.  The patient was observed for 90 seconds post injection.  After no sensory deficits were reported, and normal lower extremity motor function was noted,   the above injectate was administered so that equal amounts of the injectate were placed at each foramen (level) into the transforaminal epidural space.   Additional Comments:  The patient tolerated the procedure well Dressing: Band-Aid    Post-procedure details: Patient was observed during the procedure. Post-procedure instructions were reviewed.  Patient left the clinic in stable condition.    Clinical History: MRI LUMBAR SPINE WITHOUT CONTRAST  TECHNIQUE: Multiplanar, multisequence MR imaging of the lumbar spine was performed. No intravenous contrast was administered.  COMPARISON: Lumbar spine radiographs 04/21/2017. CT abdomen and pelvis 03/25/2014.  FINDINGS: Segmentation: Standard.  Alignment: 8 mm anterolisthesis of L5 on S1 due to bilateral L5 pars defects.  Vertebrae: No acute fracture or suspicious osseous lesion. Mild left-sided L4 inferior endplate edema, likely  degenerative with a small Schmorl's node also present in this location.  Conus medullaris and cauda equina: Conus extends to the L1-2 level. Conus and cauda equina appear normal.  Paraspinal and other soft tissues: Unremarkable.  Disc levels:  L1-2: Negative.  L2-3: Mild disc desiccation. Minimal disc bulging without stenosis.  L3-4: Mild disc desiccation. Minimal disc bulging and mild facet arthrosis without stenosis.  L4-5: Mild disc desiccation. Mild disc bulging and moderate facet hypertrophy result in mild left neural foraminal stenosis without spinal stenosis. A 5 x 1 mm cyst is noted along the left ligamentum flavum, not resulting in neural impingement.  L5-S1: Disc desiccation and severe disc space narrowing. Anterolisthesis, disc space height loss, and bulging uncovered disc result in severe bilateral neural foraminal stenosis with L5 nerve root compression. No spinal stenosis.  IMPRESSION: 1. Bilateral L5 pars defects with grade 1 anterolisthesis, advanced disc degeneration, and severe bilateral neural foraminal stenosis with L5 nerve root compression. 2. Mild disc bulging and moderate facet hypertrophy at L4-5 resulting in mild left neural foraminal stenosis.   Electronically Signed By: [place] Bores M.D. On: 05/01/2017 17:09   He reports that he has never smoked. He has never used smokeless tobacco.  Recent Labs  04/20/17 0856  HGBA1C 6.2*    Objective:  VS:  HT:    WT:   BMI:     BP:133/87  HR: bpm  TEMP: ( )  RESP:  Physical Exam  Ortho Exam Imaging: Xr C-arm No Report  Result Date: 06/10/2017 Please see Notes or Procedures tab for imaging impression.   Past Medical/Family/Surgical/Social History: Medications & Allergies reviewed per EMR, new medications updated. Patient Active Problem List   Diagnosis Date Noted  . Hyperlipidemia associated with type 2 diabetes mellitus ([place]) 06/10/2017  . Essential hypertension 06/10/2017  . Type 2  diabetes mellitus without complication, without long-term current use of insulin ([place]) 06/10/2017  . Routine general medical examination at a health care facility 06/10/2017  . Status post revision of total replacement of right knee 04/22/2017  . Failed total knee, right, sequela   . Spondylolisthesis, lumbar region 04/21/2017   Past Medical History:  Diagnosis Date  . Arthritis   . Diabetes mellitus without complication ([place])   . Hair loss   . History of kidney stones   . Hyperlipidemia    Family History  Problem Relation Age of Onset  . Cancer Mother   . Diabetes Mother   . Mental illness Mother   . Heart disease Father   . Arthritis Brother    Past Surgical History:  Procedure Laterality Date  . APPENDECTOMY    . JOINT REPLACEMENT    . LITHOTRIPSY    . REPLACEMENT TOTAL KNEE Right   . TOTAL KNEE REVISION Right 04/22/2017   Procedure: REVISION RIGHT TOTAL KNEE ARTHROPLASTY VS. POLY EXCHANGE;  Surgeon: Newt Minion, MD;  Location: [facility];  Service: Orthopedics;  Laterality: Right;   Social History   Occupational History  . Not on file  Tobacco Use  . Smoking status: Never Smoker  . Smokeless tobacco: Never Used  Substance and Sexual Activity  . Alcohol use: Yes  . Drug use: No  . Sexual activity: Not on file

## 2017-06-15 MED FILL — LISINOPRIL 10 MG TABLET: 10 | 90 days supply | Qty: 90 | Fill #0

## 2017-06-17 ENCOUNTER — Telehealth (INDEPENDENT_AMBULATORY_CARE_PROVIDER_SITE_OTHER): Payer: Self-pay | Admitting: Specialist

## 2017-06-17 NOTE — Telephone Encounter (Signed)
Dr.Moraes called would like Dr.Nitka to give him a call to discuss results MRI Results  MR Lumbar Spine w/o contrast  [phone]

## 2017-06-17 NOTE — Telephone Encounter (Signed)
MRI Results  MR Lumbar Spine w/o contrast  [phone]     Dr.Maggi called would like Dr.Nitka to give him a call to discuss results.Marland Kitchen

## 2017-06-18 ENCOUNTER — Other Ambulatory Visit (INDEPENDENT_AMBULATORY_CARE_PROVIDER_SITE_OTHER): Payer: Self-pay | Admitting: Specialist

## 2017-06-18 DIAGNOSIS — M4807 Spinal stenosis, lumbosacral region: Secondary | ICD-10-CM

## 2017-06-18 DIAGNOSIS — M4317 Spondylolisthesis, lumbosacral region: Secondary | ICD-10-CM

## 2017-06-18 MED ORDER — DICLOFENAC SODIUM 75 MG PO TBEC: 75.0000 mg | DELAYED_RELEASE_TABLET | Freq: Two times a day (BID) | ORAL | 3 refills | Status: DC

## 2017-06-18 NOTE — Telephone Encounter (Signed)
I called and spoke with Dr. Redmond School and reviewed his MRI of lumbar spine. Grade one spondylolisthesis L5-S1 with foramenal entrapment both L5 roots. He has had ESIs and is not seeing as much improvement with the more recent injections. Some weakness in foot  DF but no foot drop. Pain relief with sitting is possible. He is able to  Sleep. Taking lyrica. I recommended stationary bike, and pool walking Exercises. Wrote an order for him to start PT with primarily a flexion  Exercise program. Start Diclofenac, contnue the lyrica and increase the dose as he has adequate samples. Hopefully the nerve swelling will improve and pain pattern improve with conservative treatment. TENS ordered.

## 2017-06-18 NOTE — Progress Notes (Signed)
I called and spoke with Dr. Redmond School and reviewed his MRI of lumbar spine. Grade one spondylolisthesis L5-S1 with foramenal entrapment both L5 roots. He has had ESIs and is not seeing as much improvement with the more recent injections. Some weakness in foot  DF but no foot drop. Pain relief with sitting is possible. He is able to  Sleep. Taking lyrica. I recommended stationary bike, and pool walking Exercises. Wrote an order for him to start PT with primarily a flexion  Exercise program. Start Diclofenac, contnue the lyrica and increase the dose as he has adequate samples. Hopefully the nerve swelling will improve and pain pattern improve with conservative treatment. TENS ordered.  Marland Kitchen

## 2017-06-19 MED FILL — DICLOFENAC SODIUM 75 MG TAB: 75 | 30 days supply | Qty: 60 | Fill #0

## 2017-06-24 DIAGNOSIS — M5441 Lumbago with sciatica, right side: Secondary | ICD-10-CM | POA: Diagnosis not present

## 2017-06-24 DIAGNOSIS — M4716 Other spondylosis with myelopathy, lumbar region: Secondary | ICD-10-CM | POA: Diagnosis not present

## 2017-06-24 DIAGNOSIS — M9903 Segmental and somatic dysfunction of lumbar region: Secondary | ICD-10-CM | POA: Diagnosis not present

## 2017-06-24 DIAGNOSIS — M9905 Segmental and somatic dysfunction of pelvic region: Secondary | ICD-10-CM | POA: Diagnosis not present

## 2017-06-25 ENCOUNTER — Other Ambulatory Visit: Payer: PPO

## 2017-06-25 ENCOUNTER — Other Ambulatory Visit: Payer: Self-pay | Admitting: Family Medicine

## 2017-06-25 DIAGNOSIS — Z0184 Encounter for antibody response examination: Secondary | ICD-10-CM

## 2017-06-25 MED FILL — ZOLPIDEM TARTRATE 5 MG TAB: 5 | 20 days supply | Qty: 20 | Fill #0

## 2017-06-25 MED FILL — ATORVASTATIN 10 MG TABLET: 10 | 84 days supply | Qty: 36 | Fill #1

## 2017-06-25 NOTE — Telephone Encounter (Signed)
Pt states he needs a refill on this med as he uses this PRN and not everyday. Please send in to pharmacy

## 2017-06-26 LAB — MEASLES/MUMPS/RUBELLA IMMUNITY
MUMPS ABS, IGG: 68.9 AU/mL (ref >10.9)
RUBEOLA AB, IGG: >300 AU/mL (ref >29.9)
Rubella Antibodies, IGG: 11.4 index (ref >0.99)

## 2017-07-01 ENCOUNTER — Telehealth (INDEPENDENT_AMBULATORY_CARE_PROVIDER_SITE_OTHER): Payer: Self-pay | Admitting: Physical Medicine and Rehabilitation

## 2017-07-01 DIAGNOSIS — M4716 Other spondylosis with myelopathy, lumbar region: Secondary | ICD-10-CM | POA: Diagnosis not present

## 2017-07-01 DIAGNOSIS — M9905 Segmental and somatic dysfunction of pelvic region: Secondary | ICD-10-CM | POA: Diagnosis not present

## 2017-07-01 DIAGNOSIS — M5416 Radiculopathy, lumbar region: Secondary | ICD-10-CM | POA: Diagnosis not present

## 2017-07-01 DIAGNOSIS — M9903 Segmental and somatic dysfunction of lumbar region: Secondary | ICD-10-CM | POA: Diagnosis not present

## 2017-07-01 DIAGNOSIS — M5441 Lumbago with sciatica, right side: Secondary | ICD-10-CM | POA: Diagnosis not present

## 2017-07-01 NOTE — Telephone Encounter (Signed)
Patient is scheduled for 5/20 at 1300 with driver.

## 2017-07-01 NOTE — Telephone Encounter (Signed)
Dr Redmond School request to have another epidural injection going in at a different angle. He states that him and Dr Ernestina Patches had already discussed and the last injection did not work. He also request that if the appointment could be made for around12:30or so if possible. Call back # [phone]

## 2017-07-01 NOTE — Telephone Encounter (Signed)
See messages below.  

## 2017-07-01 NOTE — Telephone Encounter (Signed)
Yes he said

## 2017-07-01 NOTE — Telephone Encounter (Signed)
Interlaminar L5-S1

## 2017-07-06 ENCOUNTER — Ambulatory Visit (INDEPENDENT_AMBULATORY_CARE_PROVIDER_SITE_OTHER): Payer: PPO

## 2017-07-06 ENCOUNTER — Ambulatory Visit (INDEPENDENT_AMBULATORY_CARE_PROVIDER_SITE_OTHER): Payer: PPO | Admitting: Physical Medicine and Rehabilitation

## 2017-07-06 ENCOUNTER — Encounter (INDEPENDENT_AMBULATORY_CARE_PROVIDER_SITE_OTHER): Payer: Self-pay | Admitting: Physical Medicine and Rehabilitation

## 2017-07-06 VITALS — BP 132/78 | HR 81

## 2017-07-06 DIAGNOSIS — M5416 Radiculopathy, lumbar region: Secondary | ICD-10-CM

## 2017-07-06 DIAGNOSIS — M48062 Spinal stenosis, lumbar region with neurogenic claudication: Secondary | ICD-10-CM

## 2017-07-06 DIAGNOSIS — Q762 Congenital spondylolisthesis: Secondary | ICD-10-CM

## 2017-07-06 DIAGNOSIS — M4317 Spondylolisthesis, lumbosacral region: Secondary | ICD-10-CM

## 2017-07-06 MED ORDER — METHYLPREDNISOLONE ACETATE 80 MG/ML IJ SUSP: 80.0000 mg | Freq: Once | INTRAMUSCULAR | Status: DC

## 2017-07-06 NOTE — Patient Instructions (Signed)

## 2017-07-06 NOTE — Progress Notes (Signed)
lyrica 75  Pt states pain and tingling in right buttocks. Pt states walking makes it worse, injections helping symptoms to get better little by little.   .Numeric Pain Rating Scale and Functional Assessment Average Pain 3   In the last MONTH (on 0-10 scale) has pain interfered with the following?  1. General activity like being  able to carry out your everyday physical activities such as walking, climbing stairs, carrying groceries, or moving a chair?  Rating(0)   +Driver, -BT, -Dye Allergies.

## 2017-07-15 DIAGNOSIS — M5416 Radiculopathy, lumbar region: Secondary | ICD-10-CM | POA: Diagnosis not present

## 2017-07-17 NOTE — Procedures (Signed)
Lumbar Epidural Steroid Injection - Interlaminar Approach with Fluoroscopic Guidance  Patient: Adam Ware, Dr.      Date of Birth: Jan 02, 1951 MRN: [id number] PCP: Dorena Cookey, MD      Visit Date: 07/06/2017   Universal Protocol:     Consent Given By: the patient  Position: PRONE  Additional Comments: Vital signs were monitored before and after the procedure. Patient was prepped and draped in the usual sterile fashion. The correct patient, procedure, and site was verified.   Injection Procedure Details:  Procedure Site One Meds Administered:  Meds ordered this encounter  Medications  . methylPREDNISolone acetate (DEPO-MEDROL) injection 80 mg     Laterality: Right  Location/Site:  L5-S1  Needle size: 20 G  Needle type: Tuohy  Needle Placement: Paramedian epidural  Findings:   -Comments: Excellent flow of contrast into the epidural space.  Procedure Details: Using a paramedian approach from the side mentioned above, the region overlying the inferior lamina was localized under fluoroscopic visualization and the soft tissues overlying this structure were infiltrated with 4 ml. of 1% Lidocaine without Epinephrine. The Tuohy needle was inserted into the epidural space using a paramedian approach.   The epidural space was localized using loss of resistance along with lateral and bi-planar fluoroscopic views.  After negative aspirate for air, blood, and CSF, a 2 ml. volume of Isovue-250 was injected into the epidural space and the flow of contrast was observed. Radiographs were obtained for documentation purposes.    The injectate was administered into the level noted above.   Additional Comments:  The patient tolerated the procedure well Dressing: Band-Aid    Post-procedure details: Patient was observed during the procedure. Post-procedure instructions were reviewed.  Patient left the clinic in stable condition.

## 2017-07-17 NOTE — Progress Notes (Signed)
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  He is gotten really good relief of most of his pain but continues to have buttock pain worse with prolonged walking and activity.  He gets some tingling in the leg as well which is not as bothersome.  He has tried since I have seen him Lyrica 75 mg for a little over 2 weeks and he did not feel much different.  After talking with him I think it would be a good idea to restart that since he tolerated it very well.  I think if he gives that at least a solid 2 to 3 weeks of doing it twice a day he may get a better idea if it is helpful at all.  We had suggested repeating one injection from an interlaminar approach just to see if it gave him any more relief of the buttock pain we will do that today.  We talked at length today about surgical options.  I would be happy to refer him to a surgeon of his choice should he want to look at that.   ROS Otherwise per HPI.  Assessment & Plan: Visit Diagnoses:  1. Lumbar radiculopathy   2. Spondylolisthesis of lumbosacral region   3. Congenital spondylolysis   4. Spinal stenosis of lumbar region with neurogenic claudication     Plan: No additional findings.   Meds & Orders:  Meds ordered this encounter  Medications  . methylPREDNISolone acetate (DEPO-MEDROL) injection 80 mg    Orders Placed This Encounter  Procedures  . XR C-ARM NO REPORT  . Epidural Steroid injection    Follow-up: Return if symptoms worsen or fail to improve, for Consider Lyrica.   Procedures: No procedures performed  Lumbar Epidural Steroid Injection - Interlaminar Approach  with Fluoroscopic Guidance  Patient: Adam Ware, Dr.      Date of Birth: 1950/10/30 MRN: [id number] PCP: Dorena Cookey, MD      Visit Date: 07/06/2017   Universal Protocol:     Consent Given By: the patient  Position: PRONE  Additional Comments: Vital signs were monitored before and after the procedure. Patient was prepped and draped in the usual sterile fashion. The correct patient, procedure, and site was verified.   Injection Procedure Details:  Procedure Site One Meds Administered:  Meds ordered this encounter  Medications  . methylPREDNISolone acetate (DEPO-MEDROL) injection 80 mg     Laterality: Right  Location/Site:  L5-S1  Needle size: 20 G  Needle type: Tuohy  Needle Placement: Paramedian epidural  Findings:   -Comments: Excellent flow of contrast into the epidural space.  Procedure Details: Using a paramedian approach from the side mentioned above, the region overlying the inferior lamina was localized under fluoroscopic visualization and the soft tissues overlying this structure were infiltrated with 4 ml. of 1% Lidocaine without Epinephrine. The Tuohy needle was inserted into the epidural space using a paramedian approach.   The epidural space was localized using loss of resistance along with lateral and bi-planar fluoroscopic views.  After negative aspirate for air, blood, and CSF, a 2 ml. volume of Isovue-250 was injected into  the epidural space and the flow of contrast was observed. Radiographs were obtained for documentation purposes.    The injectate was administered into the level noted above.   Additional Comments:  The patient tolerated the procedure well Dressing: Band-Aid    Post-procedure details: Patient was observed during the procedure. Post-procedure instructions were reviewed.  Patient left the clinic in stable condition.   Clinical History: MRI LUMBAR SPINE WITHOUT CONTRAST  TECHNIQUE: Multiplanar, multisequence MR  imaging of the lumbar spine was performed. No intravenous contrast was administered.  COMPARISON: Lumbar spine radiographs 04/21/2017. CT abdomen and pelvis 03/25/2014.  FINDINGS: Segmentation: Standard.  Alignment: 8 mm anterolisthesis of L5 on S1 due to bilateral L5 pars defects.  Vertebrae: No acute fracture or suspicious osseous lesion. Mild left-sided L4 inferior endplate edema, likely degenerative with a small Schmorl's node also present in this location.  Conus medullaris and cauda equina: Conus extends to the L1-2 level. Conus and cauda equina appear normal.  Paraspinal and other soft tissues: Unremarkable.  Disc levels:  L1-2: Negative.  L2-3: Mild disc desiccation. Minimal disc bulging without stenosis.  L3-4: Mild disc desiccation. Minimal disc bulging and mild facet arthrosis without stenosis.  L4-5: Mild disc desiccation. Mild disc bulging and moderate facet hypertrophy result in mild left neural foraminal stenosis without spinal stenosis. A 5 x 1 mm cyst is noted along the left ligamentum flavum, not resulting in neural impingement.  L5-S1: Disc desiccation and severe disc space narrowing. Anterolisthesis, disc space height loss, and bulging uncovered disc result in severe bilateral neural foraminal stenosis with L5 nerve root compression. No spinal stenosis.  IMPRESSION: 1. Bilateral L5 pars defects with grade 1 anterolisthesis, advanced disc degeneration, and severe bilateral neural foraminal stenosis with L5 nerve root compression. 2. Mild disc bulging and moderate facet hypertrophy at L4-5 resulting in mild left neural foraminal stenosis.   Electronically Signed By: Logan Bores M.D. On: 05/01/2017 17:09   He reports that he has never smoked. He has never used smokeless tobacco.  Recent Labs    04/20/17 0856  HGBA1C 6.2*    Objective:  VS:  HT:    WT:   BMI:     BP:132/78  HR:81bpm  TEMP: ( )  RESP:  Physical Exam  Ortho  Exam Imaging: No results found.  Past Medical/Family/Surgical/Social History: Medications & Allergies reviewed per EMR, new medications updated. Patient Active Problem List   Diagnosis Date Noted  . Hyperlipidemia associated with type 2 diabetes mellitus ([place]) 06/10/2017  . Essential hypertension 06/10/2017  . Type 2 diabetes mellitus without complication, without long-term current use of insulin ([place]) 06/10/2017  . Routine general medical examination at a health care facility 06/10/2017  . Status post revision of total replacement of right knee 04/22/2017  . Failed total knee, right, sequela   . Spondylolisthesis, lumbar region 04/21/2017   Past Medical History:  Diagnosis Date  . Arthritis   . Diabetes mellitus without complication ([place])   . Hair loss   . History of kidney stones   . Hyperlipidemia    Family History  Problem Relation Age of Onset  . Cancer Mother   . Diabetes Mother   . Mental illness Mother   . Heart disease Father   . Arthritis Brother    Past Surgical History:  Procedure Laterality Date  . APPENDECTOMY    . JOINT REPLACEMENT    . LITHOTRIPSY    . REPLACEMENT TOTAL KNEE Right   . TOTAL KNEE REVISION Right 04/22/2017  Procedure: REVISION RIGHT TOTAL KNEE ARTHROPLASTY VS. POLY EXCHANGE;  Surgeon: Newt Minion, MD;  Location: [facility];  Service: Orthopedics;  Laterality: Right;   Social History   Occupational History  . Not on file  Tobacco Use  . Smoking status: Never Smoker  . Smokeless tobacco: Never Used  Substance and Sexual Activity  . Alcohol use: Yes  . Drug use: No  . Sexual activity: Not on file

## 2017-07-31 ENCOUNTER — Telehealth (INDEPENDENT_AMBULATORY_CARE_PROVIDER_SITE_OTHER): Payer: Self-pay | Admitting: *Deleted

## 2017-08-03 NOTE — Telephone Encounter (Signed)
L5 Transforaminal ok

## 2017-08-03 NOTE — Telephone Encounter (Signed)
Pt would like for you to give him a call to discuss if another inj would be worth while. He states he is unsure if he would need an inj. Please Advise.

## 2017-08-07 ENCOUNTER — Ambulatory Visit (INDEPENDENT_AMBULATORY_CARE_PROVIDER_SITE_OTHER): Payer: PPO

## 2017-08-07 ENCOUNTER — Encounter (INDEPENDENT_AMBULATORY_CARE_PROVIDER_SITE_OTHER): Payer: Self-pay | Admitting: Physical Medicine and Rehabilitation

## 2017-08-07 ENCOUNTER — Ambulatory Visit (INDEPENDENT_AMBULATORY_CARE_PROVIDER_SITE_OTHER): Payer: PPO | Admitting: Physical Medicine and Rehabilitation

## 2017-08-07 VITALS — BP 122/82 | HR 80

## 2017-08-07 DIAGNOSIS — M5416 Radiculopathy, lumbar region: Secondary | ICD-10-CM

## 2017-08-07 MED ORDER — METHYLPREDNISOLONE ACETATE 80 MG/ML IJ SUSP
80.0000 mg | Freq: Once | INTRAMUSCULAR | Status: AC
  Administered 2017-08-07: 80 mg

## 2017-08-07 NOTE — Progress Notes (Signed)
 .  Numeric Pain Rating Scale and Functional Assessment Average Pain 8   In the last MONTH (on 0-10 scale) has pain interfered with the following?  1. General activity like being  able to carry out your everyday physical activities such as walking, climbing stairs, carrying groceries, or moving a chair?  Rating(4)   +Driver, -BT, -Dye Allergies.  

## 2017-08-07 NOTE — Patient Instructions (Signed)

## 2017-08-10 NOTE — Progress Notes (Signed)
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 Lower Back - Pain, Tingling  . 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  He has had no progressive weakness but continues to have pain and paresthesia.  3 transforaminal epidural injections at least initially gave him quite a bit of relief but he has had return of complaints lately.  He woke up this morning in extreme pain but it has lessened over the morning.  He does have a trip upcoming and we did decide to repeat an L5 transforaminal epidural steroid injection diagnostically and therapeutically.  At this point with the injections not helping very much I do think she probably should look at at least surgical consultation to see what self-care in terms of decompression and minimally invasive type surgery.  He rates his average pain is an 8 out of 10.  He is using Lyrica.  He has tried other medications without relief.  He continues to do conservative care has had extensive physical therapy.   ROS Otherwise per HPI.  Assessment & Plan: Visit Diagnoses:  1. Lumbar radiculopathy     Plan: No additional findings.   Meds & Orders:  Meds ordered this encounter  Medications  . methylPREDNISolone acetate (DEPO-MEDROL) injection 80 mg    Orders Placed This Encounter  Procedures  . XR C-ARM NO REPORT  . Epidural Steroid injection    Follow-up: Return if symptoms worsen or fail to improve.   Procedures: No procedures performed  Lumbosacral Transforaminal Epidural Steroid Injection - Sub-Pedicular Approach with Fluoroscopic Guidance  Patient: Adam Ware, Dr.      Date of Birth: December 10, 1950 MRN: [id number] PCP: Dorena Cookey, MD      Visit Date: 08/07/2017     Universal Protocol:    Date/Time: 08/07/2017  Consent Given By: the patient  Position: PRONE  Additional Comments: Vital signs were monitored before and after the procedure. Patient was prepped and draped in the usual sterile fashion. The correct patient, procedure, and site was verified.   Injection Procedure Details:  Procedure Site One Meds Administered:  Meds ordered this encounter  Medications  . methylPREDNISolone acetate (DEPO-MEDROL) injection 80 mg    Laterality: Right  Location/Site:  L5-S1  Needle size: 22 G  Needle type: Spinal  Needle Placement: Transforaminal  Findings:    -Comments: Excellent flow of contrast along the nerve and into the epidural space.  Procedure Details: After squaring off the end-plates to get a true AP view, the C-arm was positioned so that an oblique view of the foramen as noted above was visualized. The target area is just inferior to the "nose of the scotty dog" or sub pedicular. The soft tissues overlying this structure were infiltrated with 2-3 ml. of 1% Lidocaine without Epinephrine.  The spinal needle was inserted toward the target using a "trajectory" view along the fluoroscope beam.  Under AP and lateral visualization, the needle was advanced so it did not puncture dura and was located close the 6 O'Clock position of the pedical in AP tracterory. Biplanar projections were used to confirm position. Aspiration was confirmed to be negative for CSF and/or blood. A 1-2 ml. volume of Isovue-250 was injected and flow of contrast  was noted at each level. Radiographs were obtained for documentation purposes.   After attaining the desired flow of contrast documented above, a 0.5 to 1.0 ml test dose of 0.25% Marcaine was injected into each respective transforaminal space.  The patient was observed for 90 seconds post injection.  After no sensory deficits were reported, and normal lower extremity motor function was noted,   the above  injectate was administered so that equal amounts of the injectate were placed at each foramen (level) into the transforaminal epidural space.   Additional Comments:  The patient tolerated the procedure well Dressing: Band-Aid    Post-procedure details: Patient was observed during the procedure. Post-procedure instructions were reviewed.  Patient left the clinic in stable condition.    Clinical History: MRI LUMBAR SPINE WITHOUT CONTRAST  TECHNIQUE: Multiplanar, multisequence MR imaging of the lumbar spine was performed. No intravenous contrast was administered.  COMPARISON: Lumbar spine radiographs 04/21/2017. CT abdomen and pelvis 03/25/2014.  FINDINGS: Segmentation: Standard.  Alignment: 8 mm anterolisthesis of L5 on S1 due to bilateral L5 pars defects.  Vertebrae: No acute fracture or suspicious osseous lesion. Mild left-sided L4 inferior endplate edema, likely degenerative with a small Schmorl's node also present in this location.  Conus medullaris and cauda equina: Conus extends to the L1-2 level. Conus and cauda equina appear normal.  Paraspinal and other soft tissues: Unremarkable.  Disc levels:  L1-2: Negative.  L2-3: Mild disc desiccation. Minimal disc bulging without stenosis.  L3-4: Mild disc desiccation. Minimal disc bulging and mild facet arthrosis without stenosis.  L4-5: Mild disc desiccation. Mild disc bulging and moderate facet hypertrophy result in mild left neural foraminal stenosis without spinal stenosis. A 5 x 1 mm cyst is noted along the left ligamentum flavum, not resulting in neural impingement.  L5-S1: Disc desiccation and severe disc space narrowing. Anterolisthesis, disc space height loss, and bulging uncovered disc result in severe bilateral neural foraminal stenosis with L5 nerve root compression. No spinal stenosis.  IMPRESSION: 1. Bilateral L5 pars defects with grade 1 anterolisthesis, advanced disc degeneration, and severe  bilateral neural foraminal stenosis with L5 nerve root compression. 2. Mild disc bulging and moderate facet hypertrophy at L4-5 resulting in mild left neural foraminal stenosis.   Electronically Signed By: Logan Bores M.D. On: 05/01/2017 17:09   He reports that he has never smoked. He has never used smokeless tobacco.  Recent Labs    04/20/17 0856  HGBA1C 6.2*    Objective:  VS:  HT:    WT:   BMI:     BP:122/82  HR:80bpm  TEMP: ( )  RESP:  Physical Exam  Ortho Exam Imaging: No results found.  Past Medical/Family/Surgical/Social History: Medications & Allergies reviewed per EMR, new medications updated. Patient Active Problem List   Diagnosis Date Noted  . Hyperlipidemia associated with type 2 diabetes mellitus ([place]) 06/10/2017  . Essential hypertension 06/10/2017  . Type 2 diabetes mellitus without complication, without long-term current use of insulin ([place]) 06/10/2017  . Routine general medical examination at a health care facility 06/10/2017  . Status post revision of total replacement of right knee 04/22/2017  . Failed total knee, right, sequela   . Spondylolisthesis, lumbar region 04/21/2017   Past Medical History:  Diagnosis Date  . Arthritis   . Diabetes mellitus without complication ([place])   . Hair loss   . History of kidney stones   . Hyperlipidemia    Family History  Problem Relation Age of Onset  . Cancer Mother   .  Diabetes Mother   . Mental illness Mother   . Heart disease Father   . Arthritis Brother    Past Surgical History:  Procedure Laterality Date  . APPENDECTOMY    . JOINT REPLACEMENT    . LITHOTRIPSY    . REPLACEMENT TOTAL KNEE Right   . TOTAL KNEE REVISION Right 04/22/2017   Procedure: REVISION RIGHT TOTAL KNEE ARTHROPLASTY VS. POLY EXCHANGE;  Surgeon: Newt Minion, MD;  Location: [facility];  Service: Orthopedics;  Laterality: Right;   Social History   Occupational History  . Not on file  Tobacco Use  . Smoking status: Never  Smoker  . Smokeless tobacco: Never Used  Substance and Sexual Activity  . Alcohol use: Yes  . Drug use: No  . Sexual activity: Not on file

## 2017-08-10 NOTE — Procedures (Signed)
Lumbosacral Transforaminal Epidural Steroid Injection - Sub-Pedicular Approach with Fluoroscopic Guidance  Patient: Adam Ware, Dr.      Date of Birth: 05-27-1950 MRN: [id number] PCP: Dorena Cookey, MD      Visit Date: 08/07/2017   Universal Protocol:    Date/Time: 08/07/2017  Consent Given By: the patient  Position: PRONE  Additional Comments: Vital signs were monitored before and after the procedure. Patient was prepped and draped in the usual sterile fashion. The correct patient, procedure, and site was verified.   Injection Procedure Details:  Procedure Site One Meds Administered:  Meds ordered this encounter  Medications  . methylPREDNISolone acetate (DEPO-MEDROL) injection 80 mg    Laterality: Right  Location/Site:  L5-S1  Needle size: 22 G  Needle type: Spinal  Needle Placement: Transforaminal  Findings:    -Comments: Excellent flow of contrast along the nerve and into the epidural space.  Procedure Details: After squaring off the end-plates to get a true AP view, the C-arm was positioned so that an oblique view of the foramen as noted above was visualized. The target area is just inferior to the "nose of the scotty dog" or sub pedicular. The soft tissues overlying this structure were infiltrated with 2-3 ml. of 1% Lidocaine without Epinephrine.  The spinal needle was inserted toward the target using a "trajectory" view along the fluoroscope beam.  Under AP and lateral visualization, the needle was advanced so it did not puncture dura and was located close the 6 O'Clock position of the pedical in AP tracterory. Biplanar projections were used to confirm position. Aspiration was confirmed to be negative for CSF and/or blood. A 1-2 ml. volume of Isovue-250 was injected and flow of contrast was noted at each level. Radiographs were obtained for documentation purposes.   After attaining the desired flow of contrast documented above, a 0.5 to 1.0 ml test dose of  0.25% Marcaine was injected into each respective transforaminal space.  The patient was observed for 90 seconds post injection.  After no sensory deficits were reported, and normal lower extremity motor function was noted,   the above injectate was administered so that equal amounts of the injectate were placed at each foramen (level) into the transforaminal epidural space.   Additional Comments:  The patient tolerated the procedure well Dressing: Band-Aid    Post-procedure details: Patient was observed during the procedure. Post-procedure instructions were reviewed.  Patient left the clinic in stable condition.

## 2017-08-21 ENCOUNTER — Other Ambulatory Visit: Payer: Self-pay | Admitting: Internal Medicine

## 2017-08-21 ENCOUNTER — Other Ambulatory Visit: Payer: Self-pay | Admitting: Family Medicine

## 2017-08-21 MED ORDER — DOXYCYCLINE HYCLATE 100 MG PO TABS: 100.0000 mg | ORAL_TABLET | Freq: Two times a day (BID) | ORAL | 0 refills | Status: DC

## 2017-08-21 MED FILL — DOXYCYCLINE HYCLATE 100 MG: 100 | 7 days supply | Qty: 14 | Fill #0

## 2017-08-24 ENCOUNTER — Other Ambulatory Visit: Payer: Self-pay | Admitting: Internal Medicine

## 2017-08-24 MED ORDER — CLINDAMYCIN HCL 300 MG PO CAPS: 300.0000 mg | ORAL_CAPSULE | Freq: Three times a day (TID) | ORAL | 0 refills | Status: DC

## 2017-08-24 MED FILL — CLINDAMYCIN HCL 300 MG CAPS: 300 | 7 days supply | Qty: 21 | Fill #0

## 2017-08-24 NOTE — Progress Notes (Signed)
Treating wound on right forearm. doxcycline does not appear to be working per vickie, NP-C

## 2017-08-25 ENCOUNTER — Telehealth: Payer: Self-pay | Admitting: Medical

## 2017-08-25 DIAGNOSIS — L298 Other pruritus: Secondary | ICD-10-CM | POA: Diagnosis not present

## 2017-08-25 NOTE — Telephone Encounter (Signed)
Reviewed labs with Dr. Redmond School at his request today given rash and skin lesion right arm.

## 2017-08-26 ENCOUNTER — Other Ambulatory Visit: Payer: Self-pay | Admitting: Medical

## 2017-08-26 MED ORDER — MECLIZINE HCL 12.5 MG PO TABS: 12.5000 mg | ORAL_TABLET | Freq: Three times a day (TID) | ORAL | 0 refills | Status: DC | PRN

## 2017-09-07 MED FILL — ATORVASTATIN 10 MG TABLET: 10 | 84 days supply | Qty: 36 | Fill #2

## 2017-09-15 MED FILL — AMOX-CLAV 875-125 MG TABLET: 875-125 | 10 days supply | Qty: 20 | Fill #0

## 2017-09-21 MED FILL — LISINOPRIL 10 MG TABLET: 10 | 90 days supply | Qty: 90 | Fill #1

## 2017-09-22 ENCOUNTER — Other Ambulatory Visit: Payer: Self-pay

## 2017-09-22 ENCOUNTER — Other Ambulatory Visit (INDEPENDENT_AMBULATORY_CARE_PROVIDER_SITE_OTHER): Payer: PPO

## 2017-09-22 DIAGNOSIS — E119 Type 2 diabetes mellitus without complications: Secondary | ICD-10-CM

## 2017-09-22 LAB — POCT GLYCOSYLATED HEMOGLOBIN (HGB A1C): Hemoglobin A1C: 6.3 % — AB (ref 4.0–5.6)

## 2017-09-23 LAB — BASIC METABOLIC PANEL
BUN/Creatinine Ratio: 15 (ref 10–24)
BUN: 14 mg/dL (ref 8–27)
CO2: 26 mmol/L (ref 20–29)
Calcium: 9.5 mg/dL (ref 8.6–10.2)
Chloride: 104 mmol/L (ref 96–106)
Creatinine, Ser: 0.92 mg/dL (ref 0.76–1.27)
GFR calc Af Amer: 98 mL/min/{1.73_m2} (ref >59)
GFR calc non Af Amer: 85 mL/min/{1.73_m2} (ref >59)
Glucose: 106 mg/dL — ABNORMAL HIGH (ref 65–99)
Potassium: 5 mmol/L (ref 3.5–5.2)
Sodium: 143 mmol/L (ref 134–144)

## 2017-09-23 LAB — LIPID PANEL
Chol/HDL Ratio: 4.1 ratio (ref 0.0–5.0)
Cholesterol, Total: 145 mg/dL (ref 100–199)
HDL: 35 mg/dL — ABNORMAL LOW (ref >39)
LDL Calculated: 73 mg/dL (ref 0–99)
Triglycerides: 184 mg/dL — ABNORMAL HIGH (ref 0–149)
VLDL Cholesterol Cal: 37 mg/dL (ref 5–40)

## 2017-09-23 LAB — MICROALBUMIN / CREATININE URINE RATIO
Creatinine, Urine: 174.3 mg/dL
Microalb/Creat Ratio: 6 mg/g creat (ref 0.0–30.0)
Microalbumin, Urine: 10.5 ug/mL

## 2017-11-25 MED FILL — ATORVASTATIN 10 MG TABLET: 10 | 84 days supply | Qty: 36 | Fill #3

## 2017-12-14 MED FILL — metFORMIN HCL 500 MG TABS: 500 | 90 days supply | Qty: 90 | Fill #1

## 2017-12-15 MED FILL — LISINOPRIL 10 MG TABLET: 10 | 90 days supply | Qty: 90 | Fill #2

## 2018-01-12 ENCOUNTER — Other Ambulatory Visit: Payer: Self-pay | Admitting: Family Medicine

## 2018-01-12 DIAGNOSIS — J019 Acute sinusitis, unspecified: Secondary | ICD-10-CM

## 2018-01-12 MED ORDER — AZITHROMYCIN 250 MG PO TABS: ORAL_TABLET | ORAL | 0 refills | Status: DC

## 2018-01-12 MED FILL — AZITHROMYCIN 250 MG TABLET: 250 | 5 days supply | Qty: 6 | Fill #0

## 2018-01-12 NOTE — Progress Notes (Signed)
   Subjective:    Patient ID: Adam Ware, Dr., male    DOB: [birth year], 67 y.o.   MRN: [id number]  HPI  One week history of nasal congestion, thick purulent drainage.    Review of Systems     Objective:   Physical Exam        Assessment & Plan:

## 2018-01-17 LAB — HEMOGLOBIN A1C: Hemoglobin A1C: 6.7

## 2018-01-27 LAB — HM DIABETES EYE EXAM

## 2018-02-11 ENCOUNTER — Telehealth: Payer: Self-pay | Admitting: Medical

## 2018-02-11 NOTE — Telephone Encounter (Signed)
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   Discussed risk and benefits of procedure.  Patient gives consent.  Cleaned and prepped area in usual sterile fashion.   Injected the left knee with a lateral approach with 40 mg of triamcinolone in 3 cc of Xylocaine with a 20-gauge needle (10cc syringe).  Estimated blood loss less than 1 cc, patient tolerated procedure well.  Discussed aftercare.

## 2018-02-15 MED FILL — ATORVASTATIN 10 MG TABLET: 10 | 84 days supply | Qty: 36 | Fill #4

## 2018-02-24 DIAGNOSIS — H40013 Open angle with borderline findings, low risk, bilateral: Secondary | ICD-10-CM | POA: Diagnosis not present

## 2018-02-24 DIAGNOSIS — H2513 Age-related nuclear cataract, bilateral: Secondary | ICD-10-CM | POA: Diagnosis not present

## 2018-02-24 DIAGNOSIS — H5703 Miosis: Secondary | ICD-10-CM | POA: Diagnosis not present

## 2018-02-24 DIAGNOSIS — E119 Type 2 diabetes mellitus without complications: Secondary | ICD-10-CM | POA: Diagnosis not present

## 2018-02-24 DIAGNOSIS — H04123 Dry eye syndrome of bilateral lacrimal glands: Secondary | ICD-10-CM | POA: Diagnosis not present

## 2018-02-24 LAB — HM DIABETES EYE EXAM

## 2018-03-09 MED FILL — LISINOPRIL 10 MG TABS: 10 | 90 days supply | Qty: 90 | Fill #3

## 2018-03-09 MED FILL — metFORMIN HCL 500 MG TABS: 500 | 90 days supply | Qty: 90 | Fill #2

## 2018-03-10 DIAGNOSIS — L570 Actinic keratosis: Secondary | ICD-10-CM | POA: Diagnosis not present

## 2018-03-10 DIAGNOSIS — L218 Other seborrheic dermatitis: Secondary | ICD-10-CM | POA: Diagnosis not present

## 2018-03-10 DIAGNOSIS — L578 Other skin changes due to chronic exposure to nonionizing radiation: Secondary | ICD-10-CM | POA: Diagnosis not present

## 2018-03-10 DIAGNOSIS — D485 Neoplasm of uncertain behavior of skin: Secondary | ICD-10-CM | POA: Diagnosis not present

## 2018-03-10 DIAGNOSIS — D229 Melanocytic nevi, unspecified: Secondary | ICD-10-CM | POA: Diagnosis not present

## 2018-03-10 DIAGNOSIS — D225 Melanocytic nevi of trunk: Secondary | ICD-10-CM | POA: Diagnosis not present

## 2018-03-10 DIAGNOSIS — L821 Other seborrheic keratosis: Secondary | ICD-10-CM | POA: Diagnosis not present

## 2018-03-10 DIAGNOSIS — L814 Other melanin hyperpigmentation: Secondary | ICD-10-CM | POA: Diagnosis not present

## 2018-03-10 MED FILL — IMIQUIMOD 5% CREAM PACKET: 5 | 28 days supply | Qty: 12 | Fill #0

## 2018-05-05 ENCOUNTER — Other Ambulatory Visit (INDEPENDENT_AMBULATORY_CARE_PROVIDER_SITE_OTHER): Payer: PPO

## 2018-05-05 ENCOUNTER — Other Ambulatory Visit: Payer: Self-pay

## 2018-05-05 ENCOUNTER — Encounter: Payer: Self-pay | Admitting: Internal Medicine

## 2018-05-05 ENCOUNTER — Ambulatory Visit (INDEPENDENT_AMBULATORY_CARE_PROVIDER_SITE_OTHER): Payer: PPO | Admitting: Internal Medicine

## 2018-05-05 VITALS — BP 120/70 | HR 80 | Temp 98.1°F | Ht 73.0 in | Wt 210.8 lb

## 2018-05-05 DIAGNOSIS — E785 Hyperlipidemia, unspecified: Secondary | ICD-10-CM | POA: Diagnosis not present

## 2018-05-05 DIAGNOSIS — E781 Pure hyperglyceridemia: Secondary | ICD-10-CM

## 2018-05-05 DIAGNOSIS — E119 Type 2 diabetes mellitus without complications: Secondary | ICD-10-CM

## 2018-05-05 DIAGNOSIS — F5104 Psychophysiologic insomnia: Secondary | ICD-10-CM | POA: Insufficient documentation

## 2018-05-05 DIAGNOSIS — I10 Essential (primary) hypertension: Secondary | ICD-10-CM

## 2018-05-05 DIAGNOSIS — R3912 Poor urinary stream: Secondary | ICD-10-CM

## 2018-05-05 DIAGNOSIS — N401 Enlarged prostate with lower urinary tract symptoms: Secondary | ICD-10-CM | POA: Diagnosis not present

## 2018-05-05 LAB — URINALYSIS, ROUTINE W REFLEX MICROSCOPIC
Bilirubin Urine: NEGATIVE
Hgb urine dipstick: NEGATIVE
Ketones, ur: NEGATIVE
Leukocytes,Ua: NEGATIVE
Nitrite: NEGATIVE
RBC / HPF: NONE SEEN (ref 0–?)
Specific Gravity, Urine: 1.025 (ref 1.000–1.030)
Total Protein, Urine: NEGATIVE
Urine Glucose: NEGATIVE
Urobilinogen, UA: 0.2 (ref 0.0–1.0)
pH: 6 (ref 5.0–8.0)

## 2018-05-05 LAB — POCT GLYCOSYLATED HEMOGLOBIN (HGB A1C): Hemoglobin A1C: 6.4 % — AB (ref 4.0–5.6)

## 2018-05-05 LAB — LIPID PANEL
Cholesterol: 130 mg/dL (ref 0–200)
HDL: 35 mg/dL — ABNORMAL LOW (ref >39.00)
NonHDL: 95.49
Total CHOL/HDL Ratio: 4
Triglycerides: 291 mg/dL — ABNORMAL HIGH (ref 0.0–149.0)
VLDL: 58.2 mg/dL — ABNORMAL HIGH (ref 0.0–40.0)

## 2018-05-05 LAB — BASIC METABOLIC PANEL
BUN: 16 mg/dL (ref 6–23)
CO2: 28 mEq/L (ref 19–32)
Calcium: 9.2 mg/dL (ref 8.4–10.5)
Chloride: 102 mEq/L (ref 96–112)
Creatinine, Ser: 0.87 mg/dL (ref 0.40–1.50)
GFR: 86.77 mL/min (ref >60.00)
Glucose, Bld: 137 mg/dL — ABNORMAL HIGH (ref 70–99)
Potassium: 4.5 mEq/L (ref 3.5–5.1)
Sodium: 138 mEq/L (ref 135–145)

## 2018-05-05 LAB — CBC WITH DIFFERENTIAL/PLATELET
Basophils Absolute: 0 10*3/uL (ref 0.0–0.1)
Basophils Relative: 0.3 % (ref 0.0–3.0)
Eosinophils Absolute: 0.1 10*3/uL (ref 0.0–0.7)
Eosinophils Relative: 1.6 % (ref 0.0–5.0)
HCT: 44.8 % (ref 39.0–52.0)
Hemoglobin: 14.9 g/dL (ref 13.0–17.0)
Lymphocytes Relative: 43.2 % (ref 12.0–46.0)
Lymphs Abs: 3.6 10*3/uL (ref 0.7–4.0)
MCHC: 33.2 g/dL (ref 30.0–36.0)
MCV: 86.3 fl (ref 78.0–100.0)
Monocytes Absolute: 0.4 10*3/uL (ref 0.1–1.0)
Monocytes Relative: 5.1 % (ref 3.0–12.0)
Neutro Abs: 4.2 10*3/uL (ref 1.4–7.7)
Neutrophils Relative %: 49.8 % (ref 43.0–77.0)
Platelets: 276 10*3/uL (ref 150.0–400.0)
RBC: 5.2 Mil/uL (ref 4.22–5.81)
RDW: 13.2 % (ref 11.5–15.5)
WBC: 8.4 10*3/uL (ref 4.0–10.5)

## 2018-05-05 LAB — LDL CHOLESTEROL, DIRECT: Direct LDL: 59 mg/dL

## 2018-05-05 LAB — MICROALBUMIN / CREATININE URINE RATIO
Creatinine,U: 118.8 mg/dL
Microalb Creat Ratio: 0.8 mg/g (ref 0.0–30.0)
Microalb, Ur: 1 mg/dL (ref 0.0–1.9)

## 2018-05-05 MED ORDER — SEMAGLUTIDE(0.25 OR 0.5MG/DOS) 2 MG/1.5ML ~~LOC~~ SOPN: 0.5000 mg | PEN_INJECTOR | SUBCUTANEOUS | 1 refills | Status: DC

## 2018-05-05 MED ORDER — ICOSAPENT ETHYL 1 G PO CAPS: 2.0000 | ORAL_CAPSULE | Freq: Two times a day (BID) | ORAL | 1 refills | Status: DC

## 2018-05-05 MED ORDER — ATORVASTATIN CALCIUM 10 MG PO TABS: 10.0000 mg | ORAL_TABLET | ORAL | 1 refills | Status: DC

## 2018-05-05 MED ORDER — ZOLPIDEM TARTRATE ER 6.25 MG PO TBCR: 6.2500 mg | EXTENDED_RELEASE_TABLET | Freq: Every evening | ORAL | 1 refills | Status: DC | PRN

## 2018-05-05 MED ORDER — FINASTERIDE 5 MG PO TABS: 5.0000 mg | ORAL_TABLET | Freq: Every day | ORAL | 1 refills | Status: DC

## 2018-05-05 MED ORDER — LISINOPRIL 10 MG PO TABS: 10.0000 mg | ORAL_TABLET | Freq: Every day | ORAL | 1 refills | Status: DC

## 2018-05-05 MED ORDER — METFORMIN HCL 500 MG PO TABS: 500.0000 mg | ORAL_TABLET | Freq: Every day | ORAL | 1 refills | Status: DC

## 2018-05-05 MED FILL — VASCEPA 1 GM CAPSULE: 1 | 90 days supply | Qty: 360 | Fill #0

## 2018-05-05 MED FILL — OZEMPIC 0.25 OR 0.5 MG/DOSE: 2 | 84 days supply | Qty: 5 | Fill #0

## 2018-05-05 MED FILL — ZOLPIDEM TART ER 6.25 MG TA: 6.25 | 30 days supply | Qty: 30 | Fill #0

## 2018-05-05 MED FILL — FINASTERIDE 5 MG TABLET: 5 | 90 days supply | Qty: 90 | Fill #0

## 2018-05-05 MED FILL — ATORVASTATIN 10 MG TABLET: 10 | 90 days supply | Qty: 39 | Fill #0

## 2018-05-05 NOTE — Progress Notes (Signed)
Subjective:  Patient ID: Adam Ware, Dr., male    DOB: 06/02/1950  Age: 67 y.o. 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  He has felt well recently.  He is using Bydureon BCise to treat DM2 but he complains it is leaving a painful bump in his abdomen and he wants to switch to a different GLP-1 agonist.  He tells me his blood sugars have been well controlled.  He plays golf and is very active.  He denies any recent episodes of CP, DOE, palpitations, edema, or fatigue.  Outpatient Medications Prior to Visit  Medication Sig Dispense Refill  . atorvastatin (LIPITOR) 10 MG tablet Take 1 tablet (10 mg total) by mouth 3 (three) times a week. 100 tablet 3  . metFORMIN (GLUCOPHAGE) 500 MG tablet Take 1 tablet (500 mg total) by mouth daily with breakfast. 100 tablet 4  . Exenatide ER (BYDUREON BCISE) 2 MG/0.85ML AUIJ Inject into the skin.    . finasteride (PROSCAR) 5 MG tablet Take 1 tablet (5 mg total) by mouth daily. (Patient taking differently: Take 5 mg by mouth daily. Taking 1mg ) 90 tablet 4  . Icosapent Ethyl (VASCEPA) 1 g CAPS Take by mouth.    Marland Kitchen lisinopril (PRINIVIL,ZESTRIL) 10 MG tablet Take 1 tablet (10 mg total) by mouth daily. 90 tablet 4  . azithromycin (ZITHROMAX Z-PAK) 250 MG tablet Take 2 tablets on day 1, then 1 tablet on days 2-5. 6 each 0  . clindamycin (CLEOCIN) 300 MG capsule Take 1 capsule (300 mg total) by mouth 3 (three) times daily. 21 capsule 0  . diclofenac (VOLTAREN) 75 MG EC tablet Take 1 tablet (75 mg total) by mouth 2 (two) times daily. 60 tablet 3  . doxycycline (VIBRA-TABS) 100 MG tablet Take 1 tablet (100 mg total) by mouth 2 (two) times daily. 14 tablet 0  . eszopiclone (LUNESTA) 2 MG TABS tablet Take 1 tablet (2 mg total) by mouth at bedtime as needed for sleep. Take immediately before bedtime 30 tablet 5  . meclizine (ANTIVERT) 12.5 MG tablet Take 1  tablet (12.5 mg total) by mouth 3 (three) times daily as needed for dizziness. 12 tablet 0  . omega-3 acid ethyl esters (LOVAZA) 1 g capsule Take 1 capsule (1 g total) by mouth 2 (two) times daily. 200 capsule 3  . zolpidem (AMBIEN) 5 MG tablet TAKE 1 TABLET BY MOUTH AT BEDTIME AS NEEDED 20 tablet 0   No facility-administered medications prior to visit.     ROS Review of Systems  Constitutional: Negative.  Negative for diaphoresis and fatigue.  HENT: Negative.   Eyes: Negative for visual disturbance.  Respiratory: Negative for cough, chest tightness, shortness of breath and wheezing.   Cardiovascular: Negative for chest pain, palpitations and leg swelling.  Gastrointestinal: Negative for abdominal pain, constipation, diarrhea and vomiting.  Endocrine: Negative.   Genitourinary: Positive for difficulty urinating. Negative for dysuria, frequency, hematuria, penile swelling, scrotal swelling, testicular pain and urgency.  Musculoskeletal: Negative for arthralgias and myalgias.  Skin: Negative.   Neurological: Negative for dizziness, weakness and light-headedness.  Hematological: Negative for adenopathy. Does not bruise/bleed easily.  Psychiatric/Behavioral: Positive for sleep disturbance (insomnia). Negative for decreased concentration, dysphoric mood and suicidal ideas.    Objective:  BP 120/70 (BP Location: Left Arm, Patient Position: Sitting, Cuff Size: Normal)   Pulse 80   Temp 98.1 F (36.7 C) (Oral)  Ht 6\' 1"  (1.854 m)   Wt 210 lb 12 oz (95.6 kg)   SpO2 95%   BMI 27.81 kg/m   BP Readings from Last 3 Encounters:  05/05/18 120/70  08/07/17 122/82  07/06/17 132/78    Wt Readings from Last 3 Encounters:  05/05/18 210 lb 12 oz (95.6 kg)  06/10/17 212 lb (96.2 kg)  05/06/17 211 lb 8 oz (95.9 kg)    Physical Exam Vitals signs reviewed.  Constitutional:      Appearance: He is not ill-appearing or diaphoretic.  HENT:     Nose: Nose normal.     Mouth/Throat:     Mouth:  Mucous membranes are moist.     Pharynx: Oropharynx is clear. No oropharyngeal exudate or posterior oropharyngeal erythema.  Eyes:     General: No scleral icterus.    Conjunctiva/sclera: Conjunctivae normal.  Neck:     Musculoskeletal: Normal range of motion and neck supple. No muscular tenderness.     Thyroid: No thyroid mass or thyromegaly.  Cardiovascular:     Rate and Rhythm: Normal rate and regular rhythm.     Heart sounds: No murmur. No gallop.   Pulmonary:     Effort: Pulmonary effort is normal.     Breath sounds: No stridor. No wheezing, rhonchi or rales.  Abdominal:     General: Bowel sounds are normal.     Palpations: There is no hepatomegaly, splenomegaly or mass.     Tenderness: There is no abdominal tenderness.  Genitourinary:    Comments: GU and rectal exams were deferred at his request. Musculoskeletal: Normal range of motion.        General: No swelling.     Right lower leg: No edema.     Left lower leg: No edema.  Skin:    General: Skin is warm and dry.     Coloration: Skin is not pale.     Findings: No erythema or rash.  Neurological:     General: No focal deficit present.     Mental Status: He is oriented to person, place, and time. Mental status is at baseline.  Psychiatric:        Mood and Affect: Mood normal.        Behavior: Behavior normal.     Lab Results  Component Value Date   WBC 8.4 05/05/2018   HGB 14.9 05/05/2018   HCT 44.8 05/05/2018   PLT 276.0 05/05/2018   GLUCOSE 137 (H) 05/05/2018   CHOL 130 05/05/2018   TRIG 291.0 (H) 05/05/2018   HDL 35.00 (L) 05/05/2018   LDLDIRECT 59.0 05/05/2018   LDLCALC 73 09/22/2017   ALT 34 04/20/2017   AST 22 04/20/2017   NA 138 05/05/2018   K 4.5 05/05/2018   CL 102 05/05/2018   CREATININE 0.87 05/05/2018   BUN 16 05/05/2018   CO2 28 05/05/2018   INR 1.05 04/20/2017   HGBA1C 6.4 (A) 05/05/2018   MICROALBUR 1.0 05/05/2018    Xr Hip Unilat W Or W/o Pelvis 2-3 Views Right  Result Date:  04/21/2017 2 view radiographs of the right hip shows good maintenance of the joint space there is very small bony spur inferiorly.  There is no subcondylar cysts no subcondylar sclerosis.  Xr Lumbar Spine 2-3 Views  Result Date: 04/21/2017 2 view radiographs of the lumbar spine shows calcification of the aorta without aneurysm.  Maximum diameter 22 mm.  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   Assessment & Plan:   Heather was seen today for hypertension, hyperlipidemia and diabetes.  Diagnoses and all orders for this visit:  Type 2 diabetes mellitus without complication, without long-term current use of insulin ([place])- His A1c is at 6.4%.  Will continue the combination of metformin and a GLP-1 agonist. -     POCT glycosylated hemoglobin (Hb A1C) -     lisinopril (PRINIVIL,ZESTRIL) 10 MG tablet; Take 1 tablet (10 mg total) by mouth daily. -     Basic metabolic panel; Future -     Microalbumin / creatinine urine ratio; Future -     Semaglutide,0.25 or 0.5MG /DOS, (OZEMPIC, 0.25 OR 0.5 MG/DOSE,) 2 MG/1.5ML SOPN; Inject 0.5 mg into the skin once a week. -     HM Diabetes Foot Exam  Benign prostatic hyperplasia with weak urinary stream- He chooses not to do PSA testing.  His symptoms are adequately well controlled. -     finasteride (PROSCAR) 5 MG tablet; Take 1 tablet (5 mg total) by mouth daily.  Essential hypertension- His blood pressure is well controlled.  Electrolytes and renal function are normal. -     lisinopril (PRINIVIL,ZESTRIL) 10 MG tablet; Take 1 tablet (10 mg total) by mouth daily. -     CBC with Differential/Platelet; Future -     Basic metabolic panel; Future -     Urinalysis, Routine w reflex microscopic; Future  Hyperlipidemia LDL goal <100- He has achieved his LDL goal and is doing  well on the statin. -     Lipid panel; Future  Pure hyperglyceridemia- I have asked him to start taking icosapent ethyl to lower his triglycerides and for CV risk reduction. -     Icosapent Ethyl (VASCEPA) 1 g CAPS; Take 2 capsules (2 g total) by mouth 2 (two) times daily. -     Lipid panel; Future  Psychophysiological insomnia -     zolpidem (AMBIEN CR) 6.25 MG CR tablet; Take 1 tablet (6.25 mg total) by mouth at bedtime as needed for sleep.   I have discontinued Adam Ware, Dr.'s eszopiclone, omega-3 acid ethyl esters, diclofenac, doxycycline, clindamycin, meclizine, azithromycin, and Exenatide ER. I have also changed his Icosapent Ethyl and zolpidem. Additionally, I am having him start on Semaglutide(0.25 or 0.5MG /DOS). Lastly, I am having him maintain his atorvastatin, metFORMIN, finasteride, and lisinopril.  Meds ordered this encounter  Medications  . Icosapent Ethyl (VASCEPA) 1 g CAPS    Sig: Take 2 capsules (2 g total) by mouth 2 (two) times daily.    Dispense:  360 capsule    Refill:  1  . finasteride (PROSCAR) 5 MG tablet    Sig: Take 1 tablet (5 mg total) by mouth daily.    Dispense:  90 tablet    Refill:  1  . lisinopril (PRINIVIL,ZESTRIL) 10 MG tablet    Sig: Take 1 tablet (10 mg total) by mouth daily.    Dispense:  90 tablet    Refill:  1  . zolpidem (AMBIEN CR) 6.25 MG CR tablet    Sig: Take 1 tablet (6.25 mg total) by mouth at bedtime as needed for sleep.    Dispense:  30 tablet    Refill:  1  . Semaglutide,0.25 or 0.5MG /DOS, (OZEMPIC, 0.25 OR 0.5 MG/DOSE,) 2 MG/1.5ML SOPN    Sig: Inject 0.5 mg into the skin once a week.  Dispense:  3 pen    Refill:  1     Follow-up: Return in about 6 months (around 11/05/2018).  Scarlette Calico, MD

## 2018-05-05 NOTE — Patient Instructions (Signed)
Type 2 Diabetes Mellitus, Diagnosis, Adult Type 2 diabetes (type 2 diabetes mellitus) is a long-term (chronic) disease. In type 2 diabetes, one or both of these problems may be present:  The pancreas does not make enough of a hormone called insulin.  Cells in the body do not respond properly to insulin that the body makes (insulin resistance). Normally, insulin allows blood sugar (glucose) to enter cells in the body. The cells use glucose for energy. Insulin resistance or lack of insulin causes excess glucose to build up in the blood instead of going into cells. As a result, high blood glucose (hyperglycemia) develops. What increases the risk? The following factors may make you more likely to develop type 2 diabetes:  Having a family member with type 2 diabetes.  Being overweight or obese.  Having an inactive (sedentary) lifestyle.  Having been diagnosed with insulin resistance.  Having a history of prediabetes, gestational diabetes, or polycystic ovary syndrome (PCOS).  Being of American-Indian, African-American, Hispanic/Latino, or Asian/Pacific Islander descent. What are the signs or symptoms? In the early stage of this condition, you may not have symptoms. Symptoms develop slowly and may include:  Increased thirst (polydipsia).  Increased hunger(polyphagia).  Increased urination (polyuria).  Increased urination during the night (nocturia).  Unexplained weight loss.  Frequent infections that keep coming back (recurring).  Fatigue.  Weakness.  Vision changes, such as blurry vision.  Cuts or bruises that are slow to heal.  Tingling or numbness in the hands or feet.  Dark patches on the skin (acanthosis nigricans). How is this diagnosed? This condition is diagnosed based on your symptoms, your medical history, a physical exam, and your blood glucose level. Your blood glucose may be checked with one or more of the following blood tests:  A fasting blood glucose (FBG)  test. You will not be allowed to eat (you will fast) for 8 hours or longer before a blood sample is taken.  A random blood glucose test. This test checks blood glucose at any time of day regardless of when you ate.  An A1c (hemoglobin A1c) blood test. This test provides information about blood glucose control over the previous 2-3 months.  An oral glucose tolerance test (OGTT). This test measures your blood glucose at two times: ? After fasting. This is your baseline blood glucose level. ? Two hours after drinking a beverage that contains glucose. You may be diagnosed with type 2 diabetes if:  Your FBG level is 126 mg/dL (7.0 mmol/L) or higher.  Your random blood glucose level is 200 mg/dL (11.1 mmol/L) or higher.  Your A1c level is 6.5% or higher.  Your OGTT result is higher than 200 mg/dL (11.1 mmol/L). These blood tests may be repeated to confirm your diagnosis. How is this treated? Your treatment may be managed by a specialist called an endocrinologist. Type 2 diabetes may be treated by following instructions from your health care provider about:  Making diet and lifestyle changes. This may include: ? Following an individualized nutrition plan that is developed by a diet and nutrition specialist (registered dietitian). ? Exercising regularly. ? Finding ways to manage stress.  Checking your blood glucose level as often as told.  Taking diabetes medicines or insulin daily. This helps to keep your blood glucose levels in the healthy range. ? If you use insulin, you may need to adjust the dosage depending on how physically active you are and what foods you eat. Your health care provider will tell you how to adjust your dosage.    Taking medicines to help prevent complications from diabetes, such as: ? Aspirin. ? Medicine to lower cholesterol. ? Medicine to control blood pressure. Your health care provider will set individualized treatment goals for you. Your goals will be based on  your age, other medical conditions you have, and how you respond to diabetes treatment. Generally, the goal of treatment is to maintain the following blood glucose levels:  Before meals (preprandial): 80-130 mg/dL (4.4-7.2 mmol/L).  After meals (postprandial): below 180 mg/dL (10 mmol/L).  A1c level: less than 7%. Follow these instructions at home: Questions to ask your health care provider  Consider asking the following questions: ? Do I need to meet with a diabetes educator? ? Where can I find a support group for people with diabetes? ? What equipment will I need to manage my diabetes at home? ? What diabetes medicines do I need, and when should I take them? ? How often do I need to check my blood glucose? ? What number can I call if I have questions? ? When is my next appointment? General instructions  Take over-the-counter and prescription medicines only as told by your health care provider.  Keep all follow-up visits as told by your health care provider. This is important.  For more information about diabetes, visit: ? American Diabetes Association (ADA): www.diabetes.org ? American Association of Diabetes Educators (AADE): www.diabeteseducator.org Contact a health care provider if:  Your blood glucose is at or above 240 mg/dL (13.3 mmol/L) for 2 days in a row.  You have been sick or have had a fever for 2 days or longer, and you are not getting better.  You have any of the following problems for more than 6 hours: ? You cannot eat or drink. ? You have nausea and vomiting. ? You have diarrhea. Get help right away if:  Your blood glucose is lower than 54 mg/dL (3.0 mmol/L).  You become confused or you have trouble thinking clearly.  You have difficulty breathing.  You have moderate or large ketone levels in your urine. Summary  Type 2 diabetes (type 2 diabetes mellitus) is a long-term (chronic) disease. In type 2 diabetes, the pancreas does not make enough of a  hormone called insulin, or cells in the body do not respond properly to insulin that the body makes (insulin resistance).  This condition is treated by making diet and lifestyle changes and taking diabetes medicines or insulin.  Your health care provider will set individualized treatment goals for you. Your goals will be based on your age, other medical conditions you have, and how you respond to diabetes treatment.  Keep all follow-up visits as told by your health care provider. This is important. This information is not intended to replace advice given to you by your health care provider. Make sure you discuss any questions you have with your health care provider. Document Released: 02/03/2005 Document Revised: 09/04/2016 Document Reviewed: 03/09/2015 Elsevier Interactive Patient Education  2019 [employer]  

## 2018-06-24 ENCOUNTER — Encounter: Payer: Self-pay | Admitting: Internal Medicine

## 2018-07-05 MED FILL — LISINOPRIL 10 MG TABLET: 10 | 90 days supply | Qty: 90 | Fill #0

## 2018-08-03 ENCOUNTER — Telehealth: Payer: Self-pay | Admitting: *Deleted

## 2018-08-03 ENCOUNTER — Telehealth: Payer: Self-pay

## 2018-08-03 NOTE — Telephone Encounter (Signed)
Patient returned call and explained this order was phoned in in error. 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 He is not interested in testing for himself. Removing form list.

## 2018-08-06 NOTE — Telephone Encounter (Signed)
Provider accidentally scheduled himslf for Covid-19 testing.

## 2018-09-14 MED FILL — ZOLPIDEM TART ER 6.25 MG TA: 6.25 | 30 days supply | Qty: 30 | Fill #1

## 2018-09-16 ENCOUNTER — Other Ambulatory Visit: Payer: Self-pay | Admitting: Internal Medicine

## 2018-09-16 ENCOUNTER — Encounter: Payer: Self-pay | Admitting: Internal Medicine

## 2018-09-16 DIAGNOSIS — F5104 Psychophysiologic insomnia: Secondary | ICD-10-CM

## 2018-09-16 MED ORDER — ZOLPIDEM TARTRATE ER 6.25 MG PO TBCR: 6.2500 mg | EXTENDED_RELEASE_TABLET | Freq: Every evening | ORAL | 1 refills | Status: DC | PRN

## 2018-09-21 ENCOUNTER — Other Ambulatory Visit: Payer: Self-pay | Admitting: Internal Medicine

## 2018-09-21 DIAGNOSIS — F5104 Psychophysiologic insomnia: Secondary | ICD-10-CM

## 2018-09-21 MED ORDER — ZOLPIDEM TARTRATE ER 6.25 MG PO TBCR: 6.2500 mg | EXTENDED_RELEASE_TABLET | Freq: Every evening | ORAL | 1 refills | Status: DC | PRN

## 2018-10-07 MED FILL — LISINOPRIL 10 MG TABS: 10 | 90 days supply | Qty: 90 | Fill #0

## 2018-10-13 MED FILL — VASCEPA 1 GM CAPSULE: 1 | 90 days supply | Qty: 360 | Fill #1

## 2018-10-19 MED FILL — FINASTERIDE 5 MG TABLET: 5 | 90 days supply | Qty: 90 | Fill #1

## 2018-10-19 MED FILL — ATORVASTATIN 10 MG TABLET: 10 | 90 days supply | Qty: 39 | Fill #1

## 2018-10-19 MED FILL — metFORMIN HCL 500 MG TABS: 500 | 90 days supply | Qty: 90 | Fill #0

## 2018-11-10 LAB — CBC AND DIFFERENTIAL
HCT: 44 (ref 41–53)
Hemoglobin: 14.1 (ref 13.5–17.5)
Platelets: 272 (ref 150–399)
WBC: 6.4

## 2018-11-10 LAB — HEPATIC FUNCTION PANEL
ALT: 20 (ref 10–40)
AST: 28 (ref 14–40)
Alkaline Phosphatase: 99 (ref 25–125)
Bilirubin, Total: 0.6

## 2018-11-10 LAB — LIPID PANEL
Cholesterol: 143 (ref 0–200)
HDL: 32 — AB (ref 35–70)
LDL Cholesterol: 61
Triglycerides: 317 — AB (ref 40–160)

## 2018-11-10 LAB — BASIC METABOLIC PANEL
BUN: 15 (ref 4–21)
CO2: 24 — AB (ref 13–22)
Chloride: 102 (ref 99–108)
Creatinine: 0.8 (ref 0.6–1.3)
Glucose: 128
Potassium: 4.6 (ref 3.4–5.3)
Sodium: 140 (ref 137–147)

## 2018-11-10 LAB — COMPREHENSIVE METABOLIC PANEL
Albumin: 4.3 (ref 3.5–5.0)
Calcium: 9.3 (ref 8.7–10.7)
Globulin: 1.9

## 2018-11-10 LAB — HEMOGLOBIN A1C: Hemoglobin A1C: 6

## 2018-11-10 LAB — TSH: TSH: 1.17 (ref 0.41–5.90)

## 2018-11-10 LAB — PSA: PSA: 0.3

## 2019-01-10 ENCOUNTER — Ambulatory Visit (INDEPENDENT_AMBULATORY_CARE_PROVIDER_SITE_OTHER): Payer: PPO | Admitting: Medical

## 2019-01-10 ENCOUNTER — Other Ambulatory Visit: Payer: Self-pay | Admitting: Internal Medicine

## 2019-01-10 DIAGNOSIS — G5603 Carpal tunnel syndrome, bilateral upper limbs: Secondary | ICD-10-CM

## 2019-01-10 DIAGNOSIS — E119 Type 2 diabetes mellitus without complications: Secondary | ICD-10-CM

## 2019-01-10 DIAGNOSIS — I10 Essential (primary) hypertension: Secondary | ICD-10-CM

## 2019-01-10 MED FILL — ATORVASTATIN 10 MG TABLET: 10 | 90 days supply | Qty: 39 | Fill #2

## 2019-01-10 MED FILL — LISINOPRIL 10 MG TABS: 10 | 90 days supply | Qty: 90 | Fill #0

## 2019-01-10 NOTE — Progress Notes (Signed)
Subjective: Chief Complaint  Patient presents with  . Carpal Tunnel   Here today for concern for carpal tunnel syndrome bilaterally.  Has been having some problems with both wrist for several months, gradually worsening.  Can get numbness and tingling of both hands, even sometimes slight bending of the hand can set off tingling and numbness.  No recent trauma or injury.  Having to hold hands in a neutral position driving.  Has tried resting arms in neutral position during sleep.  Has already been trying NSAIDs, rest.  Wants to proceed with steroid injection.  ROS as in subjective   Objective: General: Well-built well-nourished no acute distress Skin: Unremarkable, no bruising or erythema No obvious deformity of hands, pulses and cap refill normal of both hands Normal range of motion of wrist and hands and fingers bilateral, no finger deformity Positive Tinel's and Phalen's bilaterally, rest of arm exam unremarkable    Assessment: Encounter Diagnosis  Name Primary?  . Carpal tunnel syndrome, bilateral Yes    Plan: Discussed findings, and he requests steroid injection for therapy.   He has already tried rest, ice, analgesics OTC.    Procedure-steroid injection for carpal tunnel, bilateral arm: Discussed symptoms, concerns, treatment options, risks and benefits of steroid injection.   He gives consent.  Cleaned and prepped the bilateral volar wrist with betadine. Used ethyl acetate spray for topical anesthesia, followed by 0.57ml of 1% lidocaine without epinephrine for local anesthesia.  Identified landmark of insertion medial to flexor carpi radialis tendon and slightly radial from palmaris longus tendon, inserting needle at a 45 degree angle at the wrist crease, injecting distally.  Injected 0.75 mL of 1% lidocaine without epi + 20 mg (0.2mL) of Kenalog 40mg /mL using a 22 gauge needle, 1.25 mL total solution.  Covered area with sterile bandage, and patient tolerated procedure well.      After care - discussed rest, use of wrist splint, ice therapy the next 3 days, trying to keep arm in neutral position.  If not seeing improvement in the next 7 days, call back.  Otherwise recheck 2-3 weeks.

## 2019-01-12 ENCOUNTER — Other Ambulatory Visit: Payer: Self-pay | Admitting: Medical

## 2019-01-12 ENCOUNTER — Other Ambulatory Visit: Payer: Self-pay

## 2019-01-12 DIAGNOSIS — E119 Type 2 diabetes mellitus without complications: Secondary | ICD-10-CM

## 2019-01-12 MED ORDER — METFORMIN HCL 500 MG PO TABS: 500.0000 mg | ORAL_TABLET | Freq: Every day | ORAL | 0 refills | Status: DC

## 2019-01-12 MED FILL — metFORMIN HCL 500 MG TABS: 500 | 90 days supply | Qty: 90 | Fill #0

## 2019-01-12 NOTE — Progress Notes (Signed)
Refilled Metformin at his request as he is out.   Advised he follow up with his PCP

## 2019-01-17 MED ORDER — LIDOCAINE HCL 1 % IJ SOLN
10.0000 mL | Freq: Once | INTRAMUSCULAR | Status: AC
  Administered 2019-01-17: 10 mL via INTRADERMAL

## 2019-01-17 MED ORDER — TRIAMCINOLONE ACETONIDE 40 MG/ML IJ SUSP
40.0000 mg | Freq: Once | INTRAMUSCULAR | Status: AC
  Administered 2019-01-17: 40 mg via INTRAMUSCULAR

## 2019-01-17 NOTE — Addendum Note (Signed)
Addended by: Elyse Jarvis on: 01/17/2019 09:52 AM   Modules accepted: Orders

## 2019-01-28 NOTE — Addendum Note (Signed)
Addended by: Carlena Hurl on: 01/28/2019 07:45 AM   Modules accepted: Level of Service

## 2019-02-23 DIAGNOSIS — H04123 Dry eye syndrome of bilateral lacrimal glands: Secondary | ICD-10-CM | POA: Diagnosis not present

## 2019-02-23 DIAGNOSIS — H2513 Age-related nuclear cataract, bilateral: Secondary | ICD-10-CM | POA: Diagnosis not present

## 2019-02-23 DIAGNOSIS — E119 Type 2 diabetes mellitus without complications: Secondary | ICD-10-CM | POA: Diagnosis not present

## 2019-02-23 DIAGNOSIS — H40013 Open angle with borderline findings, low risk, bilateral: Secondary | ICD-10-CM | POA: Diagnosis not present

## 2019-02-23 DIAGNOSIS — H5703 Miosis: Secondary | ICD-10-CM | POA: Diagnosis not present

## 2019-02-23 LAB — HM DIABETES EYE EXAM

## 2019-02-24 ENCOUNTER — Encounter: Payer: Self-pay | Admitting: Internal Medicine

## 2019-03-14 ENCOUNTER — Other Ambulatory Visit: Payer: Self-pay | Admitting: Internal Medicine

## 2019-03-14 DIAGNOSIS — E781 Pure hyperglyceridemia: Secondary | ICD-10-CM

## 2019-03-14 MED FILL — VASCEPA 1 GM CAPSULE: 1 | 90 days supply | Qty: 360 | Fill #0

## 2019-04-11 ENCOUNTER — Other Ambulatory Visit: Payer: Self-pay | Admitting: Internal Medicine

## 2019-04-11 DIAGNOSIS — I10 Essential (primary) hypertension: Secondary | ICD-10-CM

## 2019-04-11 DIAGNOSIS — E119 Type 2 diabetes mellitus without complications: Secondary | ICD-10-CM

## 2019-04-11 MED FILL — LISINOPRIL 10 MG TABS: 10 | 90 days supply | Qty: 90 | Fill #0

## 2019-04-19 MED FILL — ATORVASTATIN 10 MG TABLET: 10 | 90 days supply | Qty: 39 | Fill #3

## 2019-05-04 ENCOUNTER — Ambulatory Visit (INDEPENDENT_AMBULATORY_CARE_PROVIDER_SITE_OTHER): Payer: PPO | Admitting: Internal Medicine

## 2019-05-04 ENCOUNTER — Other Ambulatory Visit: Payer: Self-pay

## 2019-05-04 ENCOUNTER — Encounter: Payer: Self-pay | Admitting: Internal Medicine

## 2019-05-04 VITALS — BP 138/78 | HR 77 | Temp 98.4°F | Resp 16 | Ht 73.0 in | Wt 210.5 lb

## 2019-05-04 DIAGNOSIS — N401 Enlarged prostate with lower urinary tract symptoms: Secondary | ICD-10-CM | POA: Diagnosis not present

## 2019-05-04 DIAGNOSIS — E1169 Type 2 diabetes mellitus with other specified complication: Secondary | ICD-10-CM

## 2019-05-04 DIAGNOSIS — Z0000 Encounter for general adult medical examination without abnormal findings: Secondary | ICD-10-CM

## 2019-05-04 DIAGNOSIS — R3912 Poor urinary stream: Secondary | ICD-10-CM

## 2019-05-04 DIAGNOSIS — I10 Essential (primary) hypertension: Secondary | ICD-10-CM | POA: Diagnosis not present

## 2019-05-04 DIAGNOSIS — E119 Type 2 diabetes mellitus without complications: Secondary | ICD-10-CM

## 2019-05-04 DIAGNOSIS — E785 Hyperlipidemia, unspecified: Secondary | ICD-10-CM | POA: Diagnosis not present

## 2019-05-04 DIAGNOSIS — Z1211 Encounter for screening for malignant neoplasm of colon: Secondary | ICD-10-CM

## 2019-05-04 LAB — POCT GLYCOSYLATED HEMOGLOBIN (HGB A1C): Hemoglobin A1C: 5.8 % — AB (ref 4.0–5.6)

## 2019-05-04 MED ORDER — METFORMIN HCL 500 MG PO TABS: 500.0000 mg | ORAL_TABLET | Freq: Every day | ORAL | 1 refills | Status: DC

## 2019-05-04 MED FILL — metFORMIN HCL 500 MG TABS: 500 | 90 days supply | Qty: 90 | Fill #0

## 2019-05-04 NOTE — Progress Notes (Signed)
Subjective:  Patient ID: Adam Ware, Dr., male    DOB: 08/10/1950  Age: 69 y.o. MRN: [id number]  CC: Annual Exam, Hypertension, Hyperlipidemia, and Diabetes  This visit occurred during the SARS-CoV-2 public health emergency.  Safety protocols were in place, including screening questions prior to the visit, additional usage of staff PPE, and extensive cleaning of exam room while observing appropriate contact time as indicated for disinfecting solutions.    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  He is very active.  He walks and plays golf.  His endurance is good.  He does not experience DOE, CP, palpitations, edema, or fatigue.  He tells me his blood sugars have been very well controlled.  Outpatient Medications Prior to Visit  Medication Sig Dispense Refill  . atorvastatin (LIPITOR) 10 MG tablet Take 1 tablet (10 mg total) by mouth 3 (three) times a week. 90 tablet 1  . finasteride (PROSCAR) 5 MG tablet Take 1 mg by mouth daily.    Marland Kitchen lisinopril (ZESTRIL) 10 MG tablet TAKE 1 TABLET BY MOUTH DAILY. 90 tablet 0  . VASCEPA 1 g capsule TAKE 2 CAPSULES BY MOUTH 2 TIMES DAILY. 360 capsule 1  . zolpidem (AMBIEN CR) 6.25 MG CR tablet Take 1 tablet (6.25 mg total) by mouth at bedtime as needed for sleep. 30 tablet 1  . metFORMIN (GLUCOPHAGE) 500 MG tablet Take 1 tablet (500 mg total) by mouth daily with breakfast. 90 tablet 0  . Semaglutide,0.25 or 0.5MG /DOS, (OZEMPIC, 0.25 OR 0.5 MG/DOSE,) 2 MG/1.5ML SOPN Inject 0.5 mg into the skin once a week. 3 pen 1  . finasteride (PROSCAR) 5 MG tablet Take 1 tablet (5 mg total) by mouth daily. 90 tablet 1   No facility-administered medications prior to visit.    ROS Review of Systems  Constitutional: Negative.  Negative for diaphoresis, fatigue and unexpected weight change.  HENT: Negative.   Eyes: Negative for visual disturbance.  Respiratory: Negative for cough, chest tightness, shortness of breath and wheezing.   Cardiovascular: Negative  for chest pain, palpitations and leg swelling.  Gastrointestinal: Negative for abdominal pain, constipation, diarrhea and nausea.  Endocrine: Negative for polydipsia, polyphagia and polyuria.  Genitourinary: Negative.  Negative for difficulty urinating.  Musculoskeletal: Negative.  Negative for arthralgias and myalgias.  Skin: Negative.   Neurological: Negative.  Negative for dizziness, weakness and light-headedness.  Hematological: Negative for adenopathy. Does not bruise/bleed easily.  Psychiatric/Behavioral: Negative.     Objective:  BP 138/78 (BP Location: Left Arm, Patient Position: Sitting, Cuff Size: Normal)   Pulse 77   Temp 98.4 F (36.9 C) (Oral)   Resp 16   Ht 6\' 1"  (1.854 m)   Wt 210 lb 8 oz (95.5 kg)   SpO2 95%   BMI 27.77 kg/m   BP Readings from Last 3 Encounters:  05/04/19 138/78  05/05/18 120/70  08/07/17 122/82    Wt Readings from Last 3 Encounters:  05/04/19 210 lb 8 oz (95.5 kg)  05/05/18 210 lb 12 oz (95.6 kg)  06/10/17 212 lb (96.2 kg)    Physical Exam Vitals reviewed.  Constitutional:      Appearance: Normal appearance.  HENT:     Nose: Nose normal.     Mouth/Throat:     Mouth: Mucous membranes are moist.  Eyes:     General: No scleral icterus.    Conjunctiva/sclera: Conjunctivae normal.  Cardiovascular:     Rate and Rhythm: Normal rate and regular rhythm.  Heart sounds: Normal heart sounds, S1 normal and S2 normal. No murmur.     Comments: EKG - NSR No LVH Normal EKG Pulmonary:     Effort: Pulmonary effort is normal.     Breath sounds: No stridor. No wheezing, rhonchi or rales.  Abdominal:     General: Abdomen is flat.     Palpations: There is no mass.     Tenderness: There is no abdominal tenderness.     Hernia: No hernia is present.  Genitourinary:    Comments: GU and rectal exams were deferred at his request Musculoskeletal:     Cervical back: Neck supple.     Right lower leg: No edema.     Left lower leg: No edema.   Lymphadenopathy:     Cervical: No cervical adenopathy.  Skin:    General: Skin is warm and dry.  Neurological:     General: No focal deficit present.     Mental Status: He is alert.  Psychiatric:        Mood and Affect: Mood normal.        Behavior: Behavior normal.     Lab Results  Component Value Date   WBC 6.4 11/10/2018   HGB 14.1 11/10/2018   HCT 44 11/10/2018   PLT 272 11/10/2018   GLUCOSE 137 (H) 05/05/2018   CHOL 143 11/10/2018   TRIG 317 (A) 11/10/2018   HDL 32 (A) 11/10/2018   LDLDIRECT 59.0 05/05/2018   LDLCALC 61 11/10/2018   ALT 20 11/10/2018   AST 28 11/10/2018   NA 140 11/10/2018   K 4.6 11/10/2018   CL 102 11/10/2018   CREATININE 0.8 11/10/2018   BUN 15 11/10/2018   CO2 24 (A) 11/10/2018   TSH 1.17 11/10/2018   PSA 0.3 11/10/2018   INR 1.05 04/20/2017   HGBA1C 5.8 (A) 05/04/2019   MICROALBUR 1.0 05/05/2018    XR HIP UNILAT W OR W/O PELVIS 2-3 VIEWS RIGHT  Result Date: 04/21/2017 2 view radiographs of the right hip shows good maintenance of the joint space there is very small bony spur inferiorly.  There is no subcondylar cysts no subcondylar sclerosis.  XR Lumbar Spine 2-3 Views  Result Date: 04/21/2017 2 view radiographs of the lumbar spine shows calcification of the aorta without aneurysm.  Maximum diameter 22 mm.  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   Assessment & Plan:   Adam Ware was seen today for annual exam, hypertension, hyperlipidemia and diabetes.  Diagnoses and all orders for this visit:  Routine general medical examination at a health care facility- Exam completed, recent labs reviewed, vaccines reviewed and updated, Cologuard ordered to screen for colon cancer/polyps, patient education material was given.  Hyperlipidemia LDL goal <100-  He has achieved his LDL goal and is doing well on the statin.  Benign prostatic hyperplasia with weak urinary stream- His recent PSA was normal.  This is a reassuring sign that he does not have prostate cancer.  His symptoms are adequately well controlled with finasteride.  Type 2 diabetes mellitus without complication, without long-term current use of insulin ([place])- His A1c is down to 5.8%.  I recommended he stop using the GLP-1 agonists and to stay on the current dose of Metformin. -     POCT glycosylated hemoglobin (Hb A1C) -  metFORMIN (GLUCOPHAGE) 500 MG tablet; Take 1 tablet (500 mg total) by mouth daily with breakfast. -     HM Diabetes Foot Exam  Hyperlipidemia associated with type 2 diabetes mellitus ([place])- His triglycerides remain mildly elevated.  He was encouraged to improve his lifestyle modifications.  We will continue icosapent ethyl.  Essential hypertension- His blood pressure is adequately well controlled.  Electrolytes and renal function are normal.  His EKG is normal. -     EKG 12-Lead  Colon cancer screening -     Cologuard   I have discontinued Adam Ware, Dr.'s Semaglutide(0.25 or 0.5MG /DOS). I am also having him maintain his atorvastatin, zolpidem, Vascepa, lisinopril, finasteride, and metFORMIN.  Meds ordered this encounter  Medications  . metFORMIN (GLUCOPHAGE) 500 MG tablet    Sig: Take 1 tablet (500 mg total) by mouth daily with breakfast.    Dispense:  90 tablet    Refill:  1     Follow-up: Return in about 6 months (around 11/04/2019).  Scarlette Calico, MD

## 2019-05-04 NOTE — Patient Instructions (Signed)

## 2019-05-16 DIAGNOSIS — Z1212 Encounter for screening for malignant neoplasm of rectum: Secondary | ICD-10-CM | POA: Diagnosis not present

## 2019-05-16 DIAGNOSIS — Z1211 Encounter for screening for malignant neoplasm of colon: Secondary | ICD-10-CM | POA: Diagnosis not present

## 2019-05-19 ENCOUNTER — Telehealth: Payer: Self-pay

## 2019-05-19 ENCOUNTER — Encounter: Payer: Self-pay | Admitting: Internal Medicine

## 2019-05-19 DIAGNOSIS — R195 Other fecal abnormalities: Secondary | ICD-10-CM

## 2019-05-19 LAB — COLOGUARD: Cologuard: POSITIVE — AB

## 2019-05-19 NOTE — Telephone Encounter (Signed)
Positive Cologuard result.   Will contact pt and add referral for GI.

## 2019-05-23 NOTE — Telephone Encounter (Signed)
Pt called back. Informed that cologuard was positive.   Pt stated understanding and would like to dr hung.   Referral updated.

## 2019-05-23 NOTE — Telephone Encounter (Signed)
LVM for pt to call back to go over the cologuard result.   Referral for GI has been entered.   Please get me when patient calls back. Do not give cologuard result.

## 2019-06-16 MED FILL — AMOX-CLAV 875-125 MG TABLET: 875-125 | 10 days supply | Qty: 20 | Fill #0

## 2019-07-19 ENCOUNTER — Other Ambulatory Visit: Payer: Self-pay | Admitting: Internal Medicine

## 2019-07-19 DIAGNOSIS — E119 Type 2 diabetes mellitus without complications: Secondary | ICD-10-CM

## 2019-07-19 DIAGNOSIS — E785 Hyperlipidemia, unspecified: Secondary | ICD-10-CM

## 2019-07-19 DIAGNOSIS — I10 Essential (primary) hypertension: Secondary | ICD-10-CM

## 2019-07-19 MED FILL — LISINOPRIL 10 MG TABS: 10 | 90 days supply | Qty: 90 | Fill #0

## 2019-07-19 MED FILL — ATORVASTATIN CALCIUM 10 MG: 10 | 90 days supply | Qty: 39 | Fill #0

## 2019-08-04 ENCOUNTER — Other Ambulatory Visit: Payer: Self-pay | Admitting: Family Medicine

## 2019-08-04 MED ORDER — AMOXICILLIN-POT CLAVULANATE 875-125 MG PO TABS
1.0000 | ORAL_TABLET | Freq: Two times a day (BID) | ORAL | 0 refills | Status: DC
Start: 2019-08-04 — End: 2020-02-01

## 2019-08-04 MED FILL — AMOX-CLAV 875-125 MG TABLET: 875-125 | 10 days supply | Qty: 20 | Fill #0

## 2019-08-04 NOTE — Progress Notes (Signed)
   Subjective:    Patient ID: Adam Ware, Dr., male    DOB: 11-23-1950, 69 y.o.   MRN: [id number]  HPI He called in with complaints of nasal congestion and questionable sinusitis. Requests Augmentin since he has done well in the past with this.    Review of Systems     Objective:   Physical Exam        Assessment & Plan:

## 2019-08-11 DIAGNOSIS — D122 Benign neoplasm of ascending colon: Secondary | ICD-10-CM | POA: Diagnosis not present

## 2019-08-11 DIAGNOSIS — K635 Polyp of colon: Secondary | ICD-10-CM | POA: Diagnosis not present

## 2019-08-11 DIAGNOSIS — K5730 Diverticulosis of large intestine without perforation or abscess without bleeding: Secondary | ICD-10-CM | POA: Diagnosis not present

## 2019-08-11 DIAGNOSIS — R195 Other fecal abnormalities: Secondary | ICD-10-CM | POA: Diagnosis not present

## 2019-08-11 LAB — HM COLONOSCOPY

## 2019-08-24 MED FILL — METFORMIN HCL 500 MG TABS: 500 | 90 days supply | Qty: 90 | Fill #1

## 2019-09-26 MED FILL — VASCEPA 1 GM CAPSULE: 1 | 90 days supply | Qty: 360 | Fill #1

## 2019-10-10 ENCOUNTER — Other Ambulatory Visit: Payer: Self-pay | Admitting: Internal Medicine

## 2019-10-10 DIAGNOSIS — F5104 Psychophysiologic insomnia: Secondary | ICD-10-CM

## 2019-10-18 ENCOUNTER — Other Ambulatory Visit: Payer: Self-pay | Admitting: Internal Medicine

## 2019-10-18 DIAGNOSIS — I10 Essential (primary) hypertension: Secondary | ICD-10-CM

## 2019-10-18 DIAGNOSIS — E119 Type 2 diabetes mellitus without complications: Secondary | ICD-10-CM

## 2019-10-18 MED FILL — LISINOPRIL 10 MG TABS: 10 | 90 days supply | Qty: 90 | Fill #0

## 2019-10-25 MED FILL — ATORVASTATIN CALCIUM 10 MG: 10 | 90 days supply | Qty: 39 | Fill #1

## 2019-11-15 ENCOUNTER — Other Ambulatory Visit: Payer: Self-pay | Admitting: Internal Medicine

## 2019-11-15 DIAGNOSIS — E119 Type 2 diabetes mellitus without complications: Secondary | ICD-10-CM

## 2019-11-15 MED FILL — METFORMIN HCL 500 MG TABS: 500 | 90 days supply | Qty: 90 | Fill #0

## 2019-12-14 ENCOUNTER — Other Ambulatory Visit: Payer: Self-pay

## 2019-12-14 ENCOUNTER — Ambulatory Visit (INDEPENDENT_AMBULATORY_CARE_PROVIDER_SITE_OTHER): Payer: PPO | Admitting: Family Medicine

## 2019-12-14 ENCOUNTER — Encounter: Payer: Self-pay | Admitting: Family Medicine

## 2019-12-14 DIAGNOSIS — G5602 Carpal tunnel syndrome, left upper limb: Secondary | ICD-10-CM

## 2019-12-14 DIAGNOSIS — G5601 Carpal tunnel syndrome, right upper limb: Secondary | ICD-10-CM

## 2019-12-14 DIAGNOSIS — G5603 Carpal tunnel syndrome, bilateral upper limbs: Secondary | ICD-10-CM | POA: Diagnosis not present

## 2019-12-14 NOTE — Progress Notes (Signed)
   Office Visit Note   Patient: Adam Ware, Dr.           Date of Birth: [birth year]           MRN: [id number] Visit Date: 12/14/2019 Requested by: Janith Lima, MD [place],  [place] [id number] PCP: Janith Lima, MD  Subjective: Chief Complaint  Patient presents with  . bilateral CTS injections    HPI: He is here with bilateral hand numbness.  He has had intermittent symptoms for several years, but in the past few months it has become more constant.  He even gets numbness when driving a car.  It bothers him especially at night.  Both hands are about equal.  He is mostly left-hand dominant.              ROS:   All other systems were reviewed and are negative.  Objective: Vital Signs: There were no vitals taken for this visit.  Physical Exam:  General:  Alert and oriented, in no acute distress. Pulm:  Breathing unlabored. Psy:  Normal mood, congruent affect.  Wrists: Very subtle decrease in muscle size of the right thenar muscle.  His intrinsic hand strength remains normal.  Positive Tinel's of carpal tunnel bilaterally and positive Phalen's test bilaterally.  Imaging: No results found.  Assessment & Plan: 1.  Bilateral carpal tunnel syndrome -Elected to inject both wrists today.  Follow-up as needed.     Procedures: Bilateral carpal tunnel injection: After sterile prep with Betadine, injected 1 cc 1% lidocaine without epinephrine and 40 mg methylprednisolone into the carpal tunnel of each wrist without immediate complication.    PMFS History: Patient Active Problem List   Diagnosis Date Noted  . Benign prostatic hyperplasia with weak urinary stream 05/05/2018  . Hyperlipidemia LDL goal <100 05/05/2018  . Pure hyperglyceridemia 05/05/2018  . Psychophysiological insomnia 05/05/2018  . Lumbar radiculopathy 07/15/2017  . Hyperlipidemia associated with type 2 diabetes mellitus ([place]) 06/10/2017  . Essential hypertension 06/10/2017  . Type 2  diabetes mellitus without complication, without long-term current use of insulin ([place]) 06/10/2017  . Routine general medical examination at a health care facility 06/10/2017  . Spondylolisthesis, lumbar region 04/21/2017   Past Medical History:  Diagnosis Date  . Arthritis   . Diabetes mellitus without complication ([place])   . Hair loss   . History of kidney stones   . Hyperlipidemia     Family History  Problem Relation Age of Onset  . Cancer Mother   . Diabetes Mother   . Mental illness Mother   . Heart disease Father   . Arthritis Brother     Past Surgical History:  Procedure Laterality Date  . APPENDECTOMY    . JOINT REPLACEMENT    . LITHOTRIPSY    . REPLACEMENT TOTAL KNEE Right   . TOTAL KNEE REVISION Right 04/22/2017   Procedure: REVISION RIGHT TOTAL KNEE ARTHROPLASTY VS. POLY EXCHANGE;  Surgeon: Newt Minion, MD;  Location: [facility];  Service: Orthopedics;  Laterality: Right;   Social History   Occupational History  . Not on file  Tobacco Use  . Smoking status: Never Smoker  . Smokeless tobacco: Never Used  Vaping Use  . Vaping Use: Never used  Substance and Sexual Activity  . Alcohol use: Yes  . Drug use: No  . Sexual activity: Not on file

## 2020-01-23 ENCOUNTER — Other Ambulatory Visit: Payer: Self-pay | Admitting: Internal Medicine

## 2020-01-23 DIAGNOSIS — E119 Type 2 diabetes mellitus without complications: Secondary | ICD-10-CM

## 2020-01-23 DIAGNOSIS — I10 Essential (primary) hypertension: Secondary | ICD-10-CM

## 2020-01-23 MED FILL — LISINOPRIL 10 MG TABS: 10 | 90 days supply | Qty: 90 | Fill #0

## 2020-01-25 ENCOUNTER — Other Ambulatory Visit: Payer: Self-pay | Admitting: Internal Medicine

## 2020-01-25 DIAGNOSIS — E785 Hyperlipidemia, unspecified: Secondary | ICD-10-CM

## 2020-01-25 MED FILL — ATORVASTATIN CALCIUM 10 MG: 10 | 90 days supply | Qty: 39 | Fill #0

## 2020-02-01 ENCOUNTER — Other Ambulatory Visit: Payer: Self-pay

## 2020-02-01 ENCOUNTER — Ambulatory Visit: Payer: Self-pay

## 2020-02-01 ENCOUNTER — Ambulatory Visit (INDEPENDENT_AMBULATORY_CARE_PROVIDER_SITE_OTHER): Payer: PPO | Admitting: Family Medicine

## 2020-02-01 DIAGNOSIS — L570 Actinic keratosis: Secondary | ICD-10-CM | POA: Diagnosis not present

## 2020-02-01 DIAGNOSIS — M25511 Pain in right shoulder: Secondary | ICD-10-CM

## 2020-02-01 DIAGNOSIS — L819 Disorder of pigmentation, unspecified: Secondary | ICD-10-CM | POA: Diagnosis not present

## 2020-02-01 NOTE — Progress Notes (Signed)
I saw and examined the patient with Dr. Elouise Munroe and agree with assessment and plan as outlined.    Right shoulder pain with no injury.  Left-hand dominant.  Ultrasound reveals supraspinatus tendinopathy; irregular humeral head suggesting DJD; fluid in long head biceps tendon sheath.  Elected to inject subacromial space.  If not improved, could do [place] injection.

## 2020-02-01 NOTE — Progress Notes (Signed)
Office Visit Note   Patient: Adam Ware, Dr.           Date of Birth: [birth year]           MRN: [id number] Visit Date: 02/01/2020 Requested by: Janith Lima, MD [place],  [place] [id number] PCP: Janith Lima, MD  Subjective: Chief Complaint  Patient presents with   Right Shoulder - Pain    Pain x 1-2 months. Decreased ROM. Not disturbing his sleep.    HPI: 69yo M presenting to clinic with 1-2 months of nagging right shoulder pain. Patient denies any trauma, and states he is LHD. He has noticed a worsening sharp shoulder pain with abduction. Pain is anterior/lateral, though at times he has difficulty pin-pointing it. He has tried ibuprofen intermittently, which helps. Initially, he had hoped this would just fade away, however it has persisted.               ROS:   All other systems were reviewed and are negative.  Objective: Vital Signs: There were no vitals taken for this visit.  Physical Exam:  General:  Alert and oriented, in no acute distress. Pulm:  Breathing unlabored. Psy:  Normal mood, congruent affect. Skin:  Left shoulder with no bruising, rashes or erythema. Overlying skin intact.   Left Shoulder Exam:  Inspection: Symmetric muscle mass, no atrophy or deformity, no scars. Palpation: Tenderness to palpation within subacromial area. Some tenderness over biceps, but not markedly so. No AC tenderness.   Range of motion: Reduced abduction of right arm to approximately 80* without pain. Apply scratch reduced to only level of pelvis.   Rotator cuff testing:  Full strength and no pain with empty can (supraspinatus). Does have significant pain with Full Can testing.  External rotation with full strength though some pain. Full strength and no pain with [place].  Biceps testing: Endorses pain with speeds.  Crank test: Negative  Impingement testing: Endorses pain with Hawkins  Strength testing:  5 out of 5 strength with shoulder abduction (C5),  wrist extension (C6), wrist flexion (C7), grip strength (C8), and finger abduction (T1).  Sensation: Intact to light touch throughout bilateral upper extremities.   Brisk distal capillary refill.   Imaging: Ultrasound: Right Shoulder Biceps tendon visualized in long and short axis, appears thickened with trace surrounding fluid.  Subscap with some associated cortical irregularites, mild surrounding fluid.  Suprascap with significant cortical irregularities, tendon calcifications and hypoechoic changes within anterior portion of tendon.  Increased fluid within subacromial bursa.  Infraspin and Teres Min without calcifications, surrounding fluid.  AC Joint with surrounding effusion. [facility] Joint with spurring appreciated of humoral head.    Assessment & Plan: 69 yo M presenting to clinic today with several weeks of atraumatic right shoulder pain. US performed in clinic with rotator cuff tendinopathy vs partial anterior tear of supraspinatus. Patient requesting injection therapy to try to calm symptoms, as he feels he has limited time to try exercises. Injection performed as described below, which patient tolerated very well. Good improvement during anesthetic phase. Return precautions were discussed.       Procedures: Right Shoulder Subacromial Cortisone Injection:  Risks and benefits of procedure discussed, Patient opted to proceed. Verbal Consent obtained.  Timeout performed.  Skin prepped in a sterile fashion with betadine before further cleansing with alcohol. Ethyl Chloride was used for topical analgesia.  Right Subacromial area was injected with 5cc 0.25% Bupivocaine without epinephrine via the posterior approach using a 25G,  1.5in needle. Syringe was removed from the needle, and 6mg  betamethasone was then injected into the joint.   Patient tolerated the injection well with no immediate complications. Aftercare instructions were discussed, and patient was given strict return  precautions.      PMFS History: Patient Active Problem List   Diagnosis Date Noted   Benign prostatic hyperplasia with weak urinary stream 05/05/2018   Hyperlipidemia LDL goal <100 05/05/2018   Pure hyperglyceridemia 05/05/2018   Psychophysiological insomnia 05/05/2018   Lumbar radiculopathy 07/15/2017   Hyperlipidemia associated with type 2 diabetes mellitus ([place]) 06/10/2017   Essential hypertension 06/10/2017   Type 2 diabetes mellitus without complication, without long-term current use of insulin ([place]) 06/10/2017   Routine general medical examination at a health care facility 06/10/2017   Spondylolisthesis, lumbar region 04/21/2017   Past Medical History:  Diagnosis Date   Arthritis    Diabetes mellitus without complication ([place])    Hair loss    History of kidney stones    Hyperlipidemia     Family History  Problem Relation Age of Onset   Cancer Mother    Diabetes Mother    Mental illness Mother    Heart disease Father    Arthritis Brother     Past Surgical History:  Procedure Laterality Date   APPENDECTOMY     JOINT REPLACEMENT     LITHOTRIPSY     REPLACEMENT TOTAL KNEE Right    TOTAL KNEE REVISION Right 04/22/2017   Procedure: REVISION RIGHT TOTAL KNEE ARTHROPLASTY VS. POLY EXCHANGE;  Surgeon: Newt Minion, MD;  Location: [facility];  Service: Orthopedics;  Laterality: Right;   Social History   Occupational History   Not on file  Tobacco Use   Smoking status: Never Smoker   Smokeless tobacco: Never Used  Vaping Use   Vaping Use: Never used  Substance and Sexual Activity   Alcohol use: Yes   Drug use: No   Sexual activity: Not on file

## 2020-02-02 ENCOUNTER — Other Ambulatory Visit: Payer: PPO

## 2020-02-02 DIAGNOSIS — Z1152 Encounter for screening for COVID-19: Secondary | ICD-10-CM

## 2020-02-03 LAB — SARS-COV-2, NAA 2 DAY TAT

## 2020-02-03 LAB — NOVEL CORONAVIRUS, NAA: SARS-CoV-2, NAA: NOT DETECTED

## 2020-02-29 DIAGNOSIS — E119 Type 2 diabetes mellitus without complications: Secondary | ICD-10-CM | POA: Diagnosis not present

## 2020-02-29 DIAGNOSIS — H2513 Age-related nuclear cataract, bilateral: Secondary | ICD-10-CM | POA: Diagnosis not present

## 2020-02-29 DIAGNOSIS — H5703 Miosis: Secondary | ICD-10-CM | POA: Diagnosis not present

## 2020-02-29 DIAGNOSIS — H04123 Dry eye syndrome of bilateral lacrimal glands: Secondary | ICD-10-CM | POA: Diagnosis not present

## 2020-02-29 DIAGNOSIS — H40013 Open angle with borderline findings, low risk, bilateral: Secondary | ICD-10-CM | POA: Diagnosis not present

## 2020-02-29 LAB — HM DIABETES EYE EXAM

## 2020-02-29 MED FILL — METFORMIN HCL 500 MG TABS: 500 | 90 days supply | Qty: 90 | Fill #1

## 2020-04-04 DIAGNOSIS — L570 Actinic keratosis: Secondary | ICD-10-CM | POA: Diagnosis not present

## 2020-04-04 DIAGNOSIS — L820 Inflamed seborrheic keratosis: Secondary | ICD-10-CM | POA: Diagnosis not present

## 2020-04-04 DIAGNOSIS — L853 Xerosis cutis: Secondary | ICD-10-CM | POA: Diagnosis not present

## 2020-06-22 ENCOUNTER — Other Ambulatory Visit (HOSPITAL_COMMUNITY): Payer: Self-pay | Admitting: *Deleted

## 2020-06-22 ENCOUNTER — Telehealth: Payer: Self-pay | Admitting: Medical

## 2020-06-22 NOTE — Telephone Encounter (Signed)
Dr. Redmond School requested shoulder injection today due to bursitis and inflammation   Discussed risk and benefits of procedure.  Patient gives consent.  Cleaned and prepped area in usual sterile fashion.   Injected the right shoulder with a posterior approach with 40 mg of triamcinolone in 3 cc of Xylocaine with a 20-gauge needle (10cc syringe).  Estimated blood loss less than 1 cc, patient tolerated procedure well.  Discussed aftercare.

## 2020-06-25 ENCOUNTER — Other Ambulatory Visit (HOSPITAL_COMMUNITY): Payer: Self-pay

## 2020-06-27 ENCOUNTER — Ambulatory Visit (HOSPITAL_BASED_OUTPATIENT_CLINIC_OR_DEPARTMENT_OTHER)
Admission: RE | Admit: 2020-06-27 | Discharge: 2020-06-27 | Disposition: A | Discharge status: Home / Self Care | Payer: PPO | Source: Ambulatory Visit | Attending: Cardiology | Admitting: Cardiology

## 2020-06-27 ENCOUNTER — Other Ambulatory Visit: Payer: Self-pay

## 2020-07-08 ENCOUNTER — Other Ambulatory Visit: Payer: Self-pay | Admitting: Internal Medicine

## 2020-07-08 DIAGNOSIS — N401 Enlarged prostate with lower urinary tract symptoms: Secondary | ICD-10-CM

## 2020-07-08 MED ORDER — FINASTERIDE 5 MG PO TABS
5.0000 mg | ORAL_TABLET | Freq: Every day | ORAL | 1 refills | Status: DC
  Filled 2020-07-08: qty 90, 90d supply, fill #0

## 2020-07-09 ENCOUNTER — Other Ambulatory Visit (HOSPITAL_COMMUNITY): Payer: Self-pay

## 2020-07-14 ENCOUNTER — Other Ambulatory Visit: Payer: Self-pay | Admitting: Family Medicine

## 2020-07-14 DIAGNOSIS — L03115 Cellulitis of right lower limb: Secondary | ICD-10-CM

## 2020-07-14 MED ORDER — DOXYCYCLINE HYCLATE 100 MG PO TABS: 100.0000 mg | ORAL_TABLET | Freq: Two times a day (BID) | ORAL | 0 refills | Status: DC

## 2020-07-14 NOTE — Progress Notes (Signed)
   Subjective:    Patient ID: Adam Ware, Dr., male    DOB: 02-07-1951, 70 y.o.   MRN: [id number]  HPI Patient is out of town at [employer] and called stating he has a scrape on his right lower leg that is red and worsening. States it looks infected.    Review of Systems     Objective:   Physical Exam        Assessment & Plan:  Cellulitis of right lower extremity - Plan: doxycycline (VIBRA-TABS) 100 MG tablet

## 2020-08-11 ENCOUNTER — Other Ambulatory Visit: Payer: Self-pay | Admitting: Internal Medicine

## 2020-08-11 DIAGNOSIS — E119 Type 2 diabetes mellitus without complications: Secondary | ICD-10-CM

## 2020-08-11 DIAGNOSIS — E785 Hyperlipidemia, unspecified: Secondary | ICD-10-CM

## 2020-08-13 ENCOUNTER — Ambulatory Visit: Payer: PPO

## 2020-08-13 ENCOUNTER — Telehealth (INDEPENDENT_AMBULATORY_CARE_PROVIDER_SITE_OTHER): Payer: PPO | Admitting: Family Medicine

## 2020-08-13 ENCOUNTER — Other Ambulatory Visit (HOSPITAL_COMMUNITY): Payer: Self-pay

## 2020-08-13 ENCOUNTER — Other Ambulatory Visit: Payer: Self-pay | Admitting: Family Medicine

## 2020-08-13 DIAGNOSIS — M25512 Pain in left shoulder: Secondary | ICD-10-CM

## 2020-08-13 DIAGNOSIS — M25511 Pain in right shoulder: Secondary | ICD-10-CM

## 2020-08-13 NOTE — Progress Notes (Unsigned)
sou

## 2020-08-13 NOTE — Telephone Encounter (Signed)
Subjective: He is here with worsening right shoulder pain.  A few days ago he pushed himself up out of a chair and felt sudden worsening pain.  He took ibuprofen and a couple days later while walking, he stumbled but did not fall and when he swung his arm forward it hurt again.  Pain does not keep him awake at night.  He is still able to golf.  Objective: Full active range of motion, no adhesive capsulitis.  Rotator cuff strength is 5/5 throughout.  He does have a little pain with passive impingement, there is slight tenderness over the long head biceps tendon but speeds test is equivocal.  Diagnostic ultrasound: He has tendinopathy changes in the supraspinatus but no definite tear.  There is fluid within the subacromial/subdeltoid bursa.  Impression: Right shoulder impingement with tendinopathy and bursitis  Plan: Elected to inject the subacromial bursa today.  He will start rotator cuff exercises for maintenance.  Follow-up as needed.  Procedure: Right shoulder injection: After sterile prep with Betadine, injected 5 cc 1% lidocaine without epinephrine and 6 mg betamethasone.  Ultrasound was used to guide needle placement into the bursa.

## 2020-08-21 ENCOUNTER — Telehealth: Payer: Self-pay | Admitting: Internal Medicine

## 2020-08-21 ENCOUNTER — Other Ambulatory Visit: Payer: Self-pay | Admitting: Internal Medicine

## 2020-08-21 ENCOUNTER — Other Ambulatory Visit (HOSPITAL_COMMUNITY): Payer: Self-pay

## 2020-08-21 DIAGNOSIS — E781 Pure hyperglyceridemia: Secondary | ICD-10-CM

## 2020-08-21 DIAGNOSIS — I10 Essential (primary) hypertension: Secondary | ICD-10-CM

## 2020-08-21 DIAGNOSIS — E119 Type 2 diabetes mellitus without complications: Secondary | ICD-10-CM

## 2020-08-21 DIAGNOSIS — E785 Hyperlipidemia, unspecified: Secondary | ICD-10-CM

## 2020-08-21 MED ORDER — ICOSAPENT ETHYL 1 G PO CAPS
2.0000 g | ORAL_CAPSULE | Freq: Two times a day (BID) | ORAL | 0 refills | Status: DC
  Filled 2020-08-21: qty 360, 90d supply, fill #0

## 2020-08-21 MED ORDER — ATORVASTATIN CALCIUM 10 MG PO TABS
10.0000 mg | ORAL_TABLET | ORAL | 0 refills | Status: DC
  Filled 2020-08-21: qty 36, 84d supply, fill #0
  Filled 2020-11-13: qty 36, 84d supply, fill #1
  Filled 2021-01-24: qty 36, 84d supply, fill #2

## 2020-08-21 MED ORDER — LISINOPRIL 10 MG PO TABS
10.0000 mg | ORAL_TABLET | Freq: Every day | ORAL | 0 refills | Status: DC
  Filled 2020-08-21: qty 90, 90d supply, fill #0

## 2020-08-21 MED ORDER — METFORMIN HCL 500 MG PO TABS
500.0000 mg | ORAL_TABLET | Freq: Every day | ORAL | 0 refills | Status: DC
  Filled 2020-08-21: qty 90, 90d supply, fill #0

## 2020-08-21 NOTE — Telephone Encounter (Signed)
   Patient returned call and said that he could not come in this week. He said that he can only come on 8/8. He was wondering if Dr. Ronnald Ramp could approve a 1 month refill. Please advise

## 2020-08-21 NOTE — Telephone Encounter (Signed)
Called pt, LVM.   OV needs to be moved up for refills to be granted.

## 2020-08-21 NOTE — Telephone Encounter (Signed)
1.Medication Requested: lisinopril (ZESTRIL) 10 MG tablet  metFORMIN (GLUCOPHAGE) 500 MG tablet  atorvastatin (LIPITOR) 10 MG tablet   2. Pharmacy (Name, [facility]): [facility]  3. On Med List: yes   4. Last Visit with PCP: 05-04-19  5. Next visit date with PCP: 09-24-20   Agent: Please be advised that RX refills may take up to 3 business days. We ask that you follow-up with your pharmacy.

## 2020-08-22 ENCOUNTER — Other Ambulatory Visit (HOSPITAL_COMMUNITY): Payer: Self-pay

## 2020-09-24 ENCOUNTER — Other Ambulatory Visit: Payer: Self-pay

## 2020-09-24 ENCOUNTER — Other Ambulatory Visit: Payer: PPO

## 2020-09-24 ENCOUNTER — Ambulatory Visit (INDEPENDENT_AMBULATORY_CARE_PROVIDER_SITE_OTHER): Payer: PPO | Admitting: Internal Medicine

## 2020-09-24 ENCOUNTER — Encounter: Payer: Self-pay | Admitting: Internal Medicine

## 2020-09-24 VITALS — BP 132/84 | HR 71 | Temp 98.5°F | Resp 16 | Ht 73.0 in | Wt 205.2 lb

## 2020-09-24 DIAGNOSIS — E119 Type 2 diabetes mellitus without complications: Secondary | ICD-10-CM

## 2020-09-24 DIAGNOSIS — E785 Hyperlipidemia, unspecified: Secondary | ICD-10-CM

## 2020-09-24 DIAGNOSIS — N5201 Erectile dysfunction due to arterial insufficiency: Secondary | ICD-10-CM | POA: Insufficient documentation

## 2020-09-24 DIAGNOSIS — I10 Essential (primary) hypertension: Secondary | ICD-10-CM

## 2020-09-24 DIAGNOSIS — Z0000 Encounter for general adult medical examination without abnormal findings: Secondary | ICD-10-CM | POA: Diagnosis not present

## 2020-09-24 DIAGNOSIS — E781 Pure hyperglyceridemia: Secondary | ICD-10-CM

## 2020-09-24 DIAGNOSIS — N401 Enlarged prostate with lower urinary tract symptoms: Secondary | ICD-10-CM

## 2020-09-24 DIAGNOSIS — R3912 Poor urinary stream: Secondary | ICD-10-CM | POA: Diagnosis not present

## 2020-09-24 MED ORDER — TADALAFIL 20 MG PO TABS: 20.0000 mg | ORAL_TABLET | Freq: Every day | ORAL | 1 refills | Status: DC | PRN

## 2020-09-24 NOTE — Progress Notes (Signed)
Subjective:  Patient ID: Adam Ware, Dr., male    DOB: [birth year]  Age: 70 y.o. MRN: [id number]  CC: Annual Exam  This visit occurred during the SARS-CoV-2 public health emergency.  Safety protocols were in place, including screening questions prior to the visit, additional usage of staff PPE, and extensive cleaning of exam room while observing appropriate contact time as indicated for disinfecting solutions.    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  Outpatient Medications Prior to Visit  Medication Sig Dispense Refill   atorvastatin (LIPITOR) 10 MG tablet Take 1 tablet (10 mg total) by mouth 3 (three) times a week. 90 tablet 0   Dulaglutide (TRULICITY [place]) Inject into the skin.     finasteride (PROSCAR) 5 MG tablet Take 1 tablet (5 mg total) by mouth daily. 90 tablet 1   lisinopril (ZESTRIL) 10 MG tablet Take 1 tablet (10 mg total) by mouth daily. 90 tablet 0   metFORMIN (GLUCOPHAGE) 500 MG tablet Take 1 tablet (500 mg total) by mouth daily with breakfast. 90 tablet 0   zolpidem (AMBIEN CR) 6.25 MG CR tablet TAKE ONE TABLET BY MOUTH AT BEDTIME AS NEEDED FOR SLEEP 30 tablet 2   icosapent Ethyl (VASCEPA) 1 g capsule Take 2 capsules (2 g total) by mouth 2 (two) times daily. (Patient not taking: Reported on 09/24/2020) 360 capsule 0   No facility-administered medications prior to visit.    ROS Review of Systems  Constitutional:  Negative for appetite change, diaphoresis, fatigue and unexpected weight change.  HENT: Negative.    Eyes: Negative.   Respiratory:  Negative for cough, chest tightness, shortness of breath and wheezing.   Cardiovascular:  Negative for chest pain, palpitations and leg swelling.  Gastrointestinal:  Negative for abdominal pain, constipation and nausea.  Endocrine: Negative.    Genitourinary: Negative.  Negative for difficulty urinating.       ++ED  Musculoskeletal: Negative.  Negative for arthralgias and myalgias.  Skin: Negative.  Negative for color change and pallor.  Neurological: Negative.  Negative for dizziness, weakness and light-headedness.  Hematological:  Negative for adenopathy. Does not bruise/bleed easily.  Psychiatric/Behavioral:  Positive for sleep disturbance. The patient is not nervous/anxious.    Objective:  BP 132/84 (BP Location: Right Arm, Patient Position: Sitting, Cuff Size: Large)   Pulse 71   Temp 98.5 F (36.9 C) (Oral)   Resp 16   Ht '6\' 1"'$  (1.854 m)   Wt 205 lb 3.2 oz (93.1 kg)   SpO2 95%   BMI 27.07 kg/m   BP Readings from Last 3 Encounters:  09/24/20 132/84  05/04/19 138/78  05/05/18 120/70    Wt Readings from Last 3 Encounters:  09/24/20 205 lb 3.2 oz (93.1 kg)  05/04/19 210 lb 8 oz (95.5 kg)  05/05/18 210 lb 12 oz (95.6 kg)    Physical Exam Vitals reviewed.  Constitutional:      Appearance: Normal appearance.  HENT:     Mouth/Throat:     Mouth: Mucous membranes are moist.  Eyes:     Conjunctiva/sclera: Conjunctivae normal.  Cardiovascular:     Rate and Rhythm: Normal rate and regular rhythm.     Heart sounds: Normal heart sounds, S1 normal and S2 normal. No murmur heard.    Comments: EKG- NSR, 76 bpm Normal EKG Pulmonary:  Effort: Pulmonary effort is normal.     Breath sounds: No stridor. No wheezing, rhonchi or rales.  Abdominal:     General: Abdomen is flat.     Palpations: Abdomen is soft. There is no mass.     Tenderness: There is no abdominal tenderness. There is no guarding.     Hernia: A hernia is present. Hernia is present in the ventral area.  Genitourinary:    Comments: GU/rectal exam deferred at his request. Musculoskeletal:     Right lower leg: No edema.     Left lower leg: No edema.  Skin:    General: Skin is warm and dry.  Neurological:     General: No focal deficit present.      Mental Status: He is alert.  Psychiatric:        Mood and Affect: Mood normal.        Behavior: Behavior normal.    Lab Results  Component Value Date   WBC 8.8 09/24/2020   HGB 14.4 09/24/2020   HCT 44.3 09/24/2020   PLT 241 09/24/2020   GLUCOSE 102 (H) 09/24/2020   CHOL 163 09/24/2020   TRIG 374 (H) 09/24/2020   HDL 34 (L) 09/24/2020   LDLDIRECT 59.0 05/05/2018   LDLCALC 70 09/24/2020   ALT 29 09/24/2020   AST 21 09/24/2020   NA 141 09/24/2020   K 4.4 09/24/2020   CL 102 09/24/2020   CREATININE 0.74 (L) 09/24/2020   BUN 12 09/24/2020   CO2 23 09/24/2020   TSH 1.340 09/24/2020   PSA 0.3 11/10/2018   INR 1.05 04/20/2017   HGBA1C 6.4 (H) 09/24/2020   MICROALBUR 1.0 05/05/2018    CT CARDIAC CALCIUM SCORING (DOCTOR'S DAY ONLY)  Addendum Date: 06/27/2020   ADDENDUM REPORT: 06/27/2020 14:15 CLINICAL DATA:  Cardiovascular Disease Risk stratification EXAM: Coronary Calcium Score TECHNIQUE: A gated, non-contrast computed tomography scan of the heart was performed using 40m slice thickness. Axial images were analyzed on a dedicated workstation. Calcium scoring of the coronary arteries was performed using the Agatston method. FINDINGS: Coronary arteries: Normal origins. Coronary Calcium Score: Left main: 0 Left anterior descending artery: 57 Left circumflex artery: 15 Right coronary artery: 0 Total: 72 Percentile: 34 Pericardium: Normal. Ascending Aorta: Mild dilatation of ascending aorta measuring 482mNon-cardiac: See [facility]. IMPRESSION: 1. Coronary calcium score of 72. This was 34th percentile for age-, race-, and sex-matched controls. 2.  Mild dilatation of ascending aorta measuring 4027mECOMMENDATIONS: Coronary artery calcium (CAC) score is a strong predictor of incident coronary heart disease (CHD) and provides predictive information beyond traditional risk factors. CAC scoring is reasonable to use in the decision to withhold, postpone, or initiate  statin therapy in intermediate-risk or selected borderline-risk asymptomatic adults (age 74-74-75ars and LDL-C >=70 to <190 mg/dL) who do not have diabetes or established atherosclerotic cardiovascular disease (ASCVD).* In intermediate-risk (10-year ASCVD risk >=7.5% to <20%) adults or selected borderline-risk (10-year ASCVD risk >=5% to <7.5%) adults in whom a CAC score is measured for the purpose of making a treatment decision the following recommendations have been made: If CAC=0, it is reasonable to withhold statin therapy and reassess in 5 to 10 years, as long as higher risk conditions are absent (diabetes mellitus, family history of premature CHD in first degree relatives (males <55 years; females <65 years), cigarette smoking, or LDL >=190 mg/dL). If CAC is 1 to 99, it is reasonable to initiate statin therapy for patients >=55 2ars of age. If  CAC is >=100 or >=75th percentile, it is reasonable to initiate statin therapy at any age. Cardiology referral should be considered for patients with CAC scores >=400 or >=75th percentile. *2018 AHA/ACC/AACVPR/AAPA/ABC/ACPM/ADA/AGS/APhA/ASPC/NLA/PCNA Guideline on the Management of Blood Cholesterol: A Report of the American College of Cardiology/American Heart Association Task Force on Clinical Practice Guidelines. J Am Coll Cardiol. 2019;73(24):3168-3209. Oswaldo Milian, MD Electronically Signed   By: Oswaldo Milian MD   On: 06/27/2020 14:15   Result Date: 06/27/2020 EXAM: OVER-READ INTERPRETATION  CT CHEST The following report is an over-read performed by radiologist Dr. Aletta Edouard of [facility], [place] on 06/27/2020. This over-read does not include interpretation of cardiac or coronary anatomy or pathology. The coronary calcium score interpretation by the cardiologist is attached. COMPARISON:  None. FINDINGS: Vascular: No incidental noncardiac vascular findings in the imaged chest. Mediastinum/Nodes: Visualized mediastinum and hilar regions  demonstrate no lymphadenopathy or masses. Lungs/Pleura: Visualized lungs show no evidence of pulmonary edema, consolidation, pneumothorax, nodule or pleural fluid. Upper Abdomen: No acute abnormality. Musculoskeletal: No chest wall mass or suspicious bone lesions identified. IMPRESSION: No significant incidental findings. Electronically Signed: By: Aletta Edouard M.D. On: 06/27/2020 08:56    Assessment & Plan:   Lalo was seen today for annual exam.  Diagnoses and all orders for this visit:  Essential hypertension- His EKG is reassuring.  His blood pressure is adequately well controlled.  Electrolytes and renal function are normal. -     Cancel: CBC with Differential/Platelet; Future -     Cancel: Basic metabolic panel; Future -     Cancel: TSH; Future -     EKG 12-Lead -     TSH; Future -     Basic metabolic panel; Future -     CBC with Differential/Platelet; Future  Type 2 diabetes mellitus without complication, without long-term current use of insulin ([place])- His blood sugar is adequately well controlled. -     Cancel: Basic metabolic panel; Future -     Cancel: Microalbumin / creatinine urine ratio; Future -     Cancel: Hemoglobin A1c; Future -     Hemoglobin A1c; Future -     Microalbumin / creatinine urine ratio; Future -     Basic metabolic panel; Future -     HM Diabetes Foot Exam  Benign prostatic hyperplasia with weak urinary stream- His PSA is low.  This is a reassuring sign that he does not have prostate cancer. -     Cancel: PSA; Future -     Cancel: Urinalysis, Routine w reflex microscopic; Future -     Urinalysis, Routine w reflex microscopic; Future -     PSA; Future  Hyperlipidemia LDL goal <100- LDL goal achieved. Doing well on the statin  -     Cancel: Lipid panel; Future -     Cancel: TSH; Future -     Cancel: Hepatic function panel; Future -     Hepatic function panel; Future -     TSH; Future -     Lipid panel; Future  Pure hyperglyceridemia- He has  decided not to take Vascepa. -     Cancel: Lipid panel; Future -     Lipid panel; Future  Routine general medical examination at a health care facility- Exam completed, labs reviewed, vaccines are up-to-date, cancer screenings are up-to-date, patient education was given.  Erectile dysfunction due to arterial insufficiency -     tadalafil (CIALIS) 20 MG tablet; Take 1 tablet (20 mg total) by mouth  daily as needed for erectile dysfunction.  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 I am also having him start on tadalafil. Additionally, I am having him maintain his zolpidem, Dulaglutide (TRULICITY [place]), finasteride, atorvastatin, metFORMIN, and lisinopril.  Meds ordered this encounter  Medications   tadalafil (CIALIS) 20 MG tablet    Sig: Take 1 tablet (20 mg total) by mouth daily as needed for erectile dysfunction.    Dispense:  30 tablet    Refill:  1     Follow-up: Return in about 6 months (around 03/27/2021).  Scarlette Calico, MD

## 2020-09-24 NOTE — Patient Instructions (Signed)
Health Maintenance, Male Adopting a healthy lifestyle and getting preventive care are important in promoting health and wellness. Ask your health care provider about: The right schedule for you to have regular tests and exams. Things you can do on your own to prevent diseases and keep yourself healthy. What should I know about diet, weight, and exercise? Eat a healthy diet  Eat a diet that includes plenty of vegetables, fruits, low-fat dairy products, and lean protein. Do not eat a lot of foods that are high in solid fats, added sugars, or sodium.  Maintain a healthy weight Body mass index (BMI) is a measurement that can be used to identify possible weight problems. It estimates body fat based on height and weight. Your health care provider can help determine your BMI and help you achieve or maintain ahealthy weight. Get regular exercise Get regular exercise. This is one of the most important things you can do for your health. Most adults should: Exercise for at least 150 minutes each week. The exercise should increase your heart rate and make you sweat (moderate-intensity exercise). Do strengthening exercises at least twice a week. This is in addition to the moderate-intensity exercise. Spend less time sitting. Even light physical activity can be beneficial. Watch cholesterol and blood lipids Have your blood tested for lipids and cholesterol at 72 years of age, then havethis test every 5 years. You may need to have your cholesterol levels checked more often if: Your lipid or cholesterol levels are high. You are older than 72 years of age. You are at high risk for heart disease. What should I know about cancer screening? Many types of cancers can be detected early and may often be prevented. Depending on your health history and family history, you may need to have cancer screening at various ages. This may include screening for: Colorectal cancer. Prostate cancer. Skin cancer. Lung  cancer. What should I know about heart disease, diabetes, and high blood pressure? Blood pressure and heart disease High blood pressure causes heart disease and increases the risk of stroke. This is more likely to develop in people who have high blood pressure readings, are of African descent, or are overweight. Talk with your health care provider about your target blood pressure readings. Have your blood pressure checked: Every 3-5 years if you are 18-39 years of age. Every year if you are 40 years old or older. If you are between the ages of 65 and 75 and are a current or former smoker, ask your health care provider if you should have a one-time screening for abdominal aortic aneurysm (AAA). Diabetes Have regular diabetes screenings. This checks your fasting blood sugar level. Have the screening done: Once every three years after age 45 if you are at a normal weight and have a low risk for diabetes. More often and at a younger age if you are overweight or have a high risk for diabetes. What should I know about preventing infection? Hepatitis B If you have a higher risk for hepatitis B, you should be screened for this virus. Talk with your health care provider to find out if you are at risk forhepatitis B infection. Hepatitis C Blood testing is recommended for: Everyone born from 1945 through 1965. Anyone with known risk factors for hepatitis C. Sexually transmitted infections (STIs) You should be screened each year for STIs, including gonorrhea and chlamydia, if: You are sexually active and are younger than 72 years of age. You are older than 72 years of age   and your health care provider tells you that you are at risk for this type of infection. Your sexual activity has changed since you were last screened, and you are at increased risk for chlamydia or gonorrhea. Ask your health care provider if you are at risk. Ask your health care provider about whether you are at high risk for HIV.  Your health care provider may recommend a prescription medicine to help prevent HIV infection. If you choose to take medicine to prevent HIV, you should first get tested for HIV. You should then be tested every 3 months for as long as you are taking the medicine. Follow these instructions at home: Lifestyle Do not use any products that contain nicotine or tobacco, such as cigarettes, e-cigarettes, and chewing tobacco. If you need help quitting, ask your health care provider. Do not use street drugs. Do not share needles. Ask your health care provider for help if you need support or information about quitting drugs. Alcohol use Do not drink alcohol if your health care provider tells you not to drink. If you drink alcohol: Limit how much you have to 0-2 drinks a day. Be aware of how much alcohol is in your drink. In the [place] one drink equals one 12 oz bottle of beer (355 mL), one 5 oz glass of wine (148 mL), or one 1 oz glass of hard liquor (44 mL). General instructions Schedule regular health, dental, and eye exams. Stay current with your vaccines. Tell your health care provider if: You often feel depressed. You have ever been abused or do not feel safe at home. Summary Adopting a healthy lifestyle and getting preventive care are important in promoting health and wellness. Follow your health care provider's instructions about healthy diet, exercising, and getting tested or screened for diseases. Follow your health care provider's instructions on monitoring your cholesterol and blood pressure. This information is not intended to replace advice given to you by your health care provider. Make sure you discuss any questions you have with your healthcare provider. Document Revised: 01/27/2018 Document Reviewed: 01/27/2018 Elsevier Patient Education  2022 Elsevier Inc.  

## 2020-09-25 LAB — HEPATIC FUNCTION PANEL
ALT: 29 IU/L (ref 0–44)
AST: 21 IU/L (ref 0–40)
Albumin: 4.5 g/dL (ref 3.7–4.7)
Alkaline Phosphatase: 113 IU/L (ref 44–121)
Bilirubin Total: 0.9 mg/dL (ref 0.0–1.2)
Bilirubin, Direct: 0.18 mg/dL (ref 0.00–0.40)
Total Protein: 6.5 g/dL (ref 6.0–8.5)

## 2020-09-25 LAB — HEMOGLOBIN A1C
Est. average glucose Bld gHb Est-mCnc: 137 mg/dL
Hgb A1c MFr Bld: 6.4 % — ABNORMAL HIGH (ref 4.8–5.6)

## 2020-09-25 LAB — CBC WITH DIFFERENTIAL/PLATELET
Basophils Absolute: 0 10*3/uL (ref 0.0–0.2)
Basos: 1 %
EOS (ABSOLUTE): 0.1 10*3/uL (ref 0.0–0.4)
Eos: 1 %
Hematocrit: 44.3 % (ref 37.5–51.0)
Hemoglobin: 14.4 g/dL (ref 13.0–17.7)
Immature Grans (Abs): 0.1 10*3/uL (ref 0.0–0.1)
Immature Granulocytes: 1 %
Lymphocytes Absolute: 3.7 10*3/uL — ABNORMAL HIGH (ref 0.7–3.1)
Lymphs: 42 %
MCH: 28.6 pg (ref 26.6–33.0)
MCHC: 32.5 g/dL (ref 31.5–35.7)
MCV: 88 fL (ref 79–97)
Monocytes Absolute: 0.6 10*3/uL (ref 0.1–0.9)
Monocytes: 7 %
Neutrophils Absolute: 4.3 10*3/uL (ref 1.4–7.0)
Neutrophils: 48 %
Platelets: 241 10*3/uL (ref 150–450)
RBC: 5.04 x10E6/uL (ref 4.14–5.80)
RDW: 12.9 % (ref 11.6–15.4)
WBC: 8.8 10*3/uL (ref 3.4–10.8)

## 2020-09-25 LAB — URINALYSIS, ROUTINE W REFLEX MICROSCOPIC
Bilirubin, UA: NEGATIVE
Glucose, UA: NEGATIVE
Ketones, UA: NEGATIVE
Leukocytes,UA: NEGATIVE
Nitrite, UA: NEGATIVE
Protein,UA: NEGATIVE
RBC, UA: NEGATIVE
Specific Gravity, UA: 1.023 (ref 1.005–1.030)
Urobilinogen, Ur: 0.2 mg/dL (ref 0.2–1.0)
pH, UA: 5.5 (ref 5.0–7.5)

## 2020-09-25 LAB — BASIC METABOLIC PANEL
BUN/Creatinine Ratio: 16 (ref 10–24)
BUN: 12 mg/dL (ref 8–27)
CO2: 23 mmol/L (ref 20–29)
Calcium: 9.4 mg/dL (ref 8.6–10.2)
Chloride: 102 mmol/L (ref 96–106)
Creatinine, Ser: 0.74 mg/dL — ABNORMAL LOW (ref 0.76–1.27)
Glucose: 102 mg/dL — ABNORMAL HIGH (ref 65–99)
Potassium: 4.4 mmol/L (ref 3.5–5.2)
Sodium: 141 mmol/L (ref 134–144)
eGFR: 96 mL/min/{1.73_m2} (ref >59)

## 2020-09-25 LAB — TSH: TSH: 1.34 u[IU]/mL (ref 0.450–4.500)

## 2020-09-25 LAB — LIPID PANEL
Chol/HDL Ratio: 4.8 ratio (ref 0.0–5.0)
Cholesterol, Total: 163 mg/dL (ref 100–199)
HDL: 34 mg/dL — ABNORMAL LOW (ref >39)
LDL Chol Calc (NIH): 70 mg/dL (ref 0–99)
Triglycerides: 374 mg/dL — ABNORMAL HIGH (ref 0–149)
VLDL Cholesterol Cal: 59 mg/dL — ABNORMAL HIGH (ref 5–40)

## 2020-09-25 LAB — MICROALBUMIN / CREATININE URINE RATIO
Creatinine, Urine: 139.2 mg/dL
Microalb/Creat Ratio: 8 mg/g creat (ref 0–29)
Microalbumin, Urine: 11.7 ug/mL

## 2020-09-25 LAB — PSA: Prostate Specific Ag, Serum: 0.4 ng/mL (ref 0.0–4.0)

## 2020-11-13 ENCOUNTER — Other Ambulatory Visit: Payer: Self-pay | Admitting: Internal Medicine

## 2020-11-13 ENCOUNTER — Other Ambulatory Visit (HOSPITAL_COMMUNITY): Payer: Self-pay

## 2020-11-13 DIAGNOSIS — E119 Type 2 diabetes mellitus without complications: Secondary | ICD-10-CM

## 2020-11-13 MED ORDER — METFORMIN HCL 500 MG PO TABS
500.0000 mg | ORAL_TABLET | Freq: Every day | ORAL | 1 refills | Status: DC
Start: 2020-11-13 — End: 2021-06-12
  Filled 2020-11-13: qty 90, 90d supply, fill #0
  Filled 2021-02-26: qty 90, 90d supply, fill #1

## 2020-11-28 ENCOUNTER — Other Ambulatory Visit: Payer: Self-pay

## 2020-11-28 ENCOUNTER — Ambulatory Visit: Payer: PPO | Admitting: Orthopedic Surgery

## 2020-11-28 ENCOUNTER — Encounter: Payer: Self-pay | Admitting: Orthopedic Surgery

## 2020-11-28 DIAGNOSIS — G5602 Carpal tunnel syndrome, left upper limb: Secondary | ICD-10-CM | POA: Diagnosis not present

## 2020-11-28 DIAGNOSIS — M79642 Pain in left hand: Secondary | ICD-10-CM

## 2020-11-28 DIAGNOSIS — M79641 Pain in right hand: Secondary | ICD-10-CM | POA: Diagnosis not present

## 2020-11-28 DIAGNOSIS — G5601 Carpal tunnel syndrome, right upper limb: Secondary | ICD-10-CM

## 2020-11-28 MED ORDER — BETAMETHASONE SOD PHOS & ACET 6 (3-3) MG/ML IJ SUSP
6.0000 mg | INTRAMUSCULAR | Status: AC | PRN
  Administered 2020-11-28: 6 mg via INTRA_ARTICULAR

## 2020-11-28 MED ORDER — LIDOCAINE HCL 1 % IJ SOLN
1.0000 mL | INTRAMUSCULAR | Status: AC | PRN
  Administered 2020-11-28: 1 mL

## 2020-11-28 NOTE — Progress Notes (Signed)
Office Visit Note   Patient: Adam Ware, Dr.           Date of Birth: October 11, 1950           MRN: [id number] Visit Date: 11/28/2020              Requested by: Janith Lima, MD [address] [place],  [place] [address] PCP: Janith Lima, MD   Assessment & Plan: Visit Diagnoses:  1. Carpal tunnel syndrome, left upper limb   2. Carpal tunnel syndrome, right upper limb     Plan: Patient underwent CSI into bilateral carpal tunnels approximately one year ago with significant symptom relief until recently.  He describes numbness and paresthesias in the median nerve distribution that wakes him from sleep.  He denies any weakness or clumsiness in his hands.  He is interested in bilateral carpal tunnel injections today given the significant relief he got previously.  We discussed that the next step would be carpal tunnel release when the relief from these injections wears off.   Follow-Up Instructions: No follow-ups on file.   Orders:  No orders of the defined types were placed in this encounter.  No orders of the defined types were placed in this encounter.     Procedures: Hand/UE Inj: bilateral carpal tunnel for carpal tunnel syndrome on 11/28/2020 9:13 AM Indications: therapeutic Details: 25 G needle, volar approach Medications (Right): 1 mL lidocaine 1 %; 6 mg betamethasone acetate-betamethasone sodium phosphate 6 (3-3) MG/ML Medications (Left): 1 mL lidocaine 1 %; 6 mg betamethasone acetate-betamethasone sodium phosphate 6 (3-3) MG/ML Outcome: tolerated well, no immediate complications Procedure, treatment alternatives, risks and benefits explained, specific risks discussed. Consent was given by the patient. Patient was prepped and draped in the usual sterile fashion.      Clinical Data: No additional findings.   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  This has been a chronic problem for him.  He was seen in our office approximately one year ago at which time he underwent bilateral CSI into the carpal tunnel.  He got nearly a year of symptom relief with these injections.  His symptoms have returned and are quite bothersome.  He wakes from sleep and has to dangle his hands off the bed for symptom relief.  He is interested in repeat injections today.    Review of Systems   Objective: Vital Signs: There were no vitals taken for this visit.  Physical Exam Constitutional:      Appearance: Normal appearance.  Cardiovascular:     Rate and Rhythm: Normal rate.     Pulses: Normal pulses.  Pulmonary:     Effort: Pulmonary effort is normal.  Skin:    General: Skin is warm and dry.     Capillary Refill: Capillary refill takes less than 2 seconds.  Neurological:     Mental Status: He is alert.    Right Hand Exam   Tenderness  The patient is experiencing no tenderness.   Range of Motion  The patient has normal right wrist ROM.   Muscle Strength  The patient has normal right wrist strength.  Other  Erythema: absent Sensation: normal Pulse: present  Comments:  + Tinel at wrist.  + CT compression test.  No thenar atrophy with 5/5 thenar strength.  Left Hand Exam   Tenderness  The patient is experiencing no tenderness.   Range of Motion  The patient has normal left wrist ROM.  Muscle Strength  The patient has normal left wrist strength.  Other  Erythema: absent Sensation: normal Pulse: present  Comments:  + Tinel at wrist.  + CT compression test.  No thenar atrophy with 5/5 thenar strength.     Specialty Comments:  No specialty comments available.  Imaging: No results found.   PMFS History: Patient Active Problem List   Diagnosis Date Noted   Carpal tunnel syndrome, left upper limb 11/28/2020   Carpal tunnel syndrome, right upper limb 11/28/2020   Erectile  dysfunction due to arterial insufficiency 09/24/2020   Benign prostatic hyperplasia with weak urinary stream 05/05/2018   Hyperlipidemia LDL goal <100 05/05/2018   Pure hyperglyceridemia 05/05/2018   Psychophysiological insomnia 05/05/2018   Lumbar radiculopathy 07/15/2017   Hyperlipidemia associated with type 2 diabetes mellitus ([place]) 06/10/2017   Essential hypertension 06/10/2017   Type 2 diabetes mellitus without complication, without long-term current use of insulin ([place]) 06/10/2017   Routine general medical examination at a health care facility 06/10/2017   Spondylolisthesis, lumbar region 04/21/2017   Past Medical History:  Diagnosis Date   Arthritis    Diabetes mellitus without complication ([place])    Hair loss    History of kidney stones    Hyperlipidemia     Family History  Problem Relation Age of Onset   Cancer Mother    Diabetes Mother    Mental illness Mother    Heart disease Father    Arthritis Brother     Past Surgical History:  Procedure Laterality Date   APPENDECTOMY     JOINT REPLACEMENT     LITHOTRIPSY     REPLACEMENT TOTAL KNEE Right    TOTAL KNEE REVISION Right 04/22/2017   Procedure: REVISION RIGHT TOTAL KNEE ARTHROPLASTY VS. POLY EXCHANGE;  Surgeon: Newt Minion, MD;  Location: [facility];  Service: Orthopedics;  Laterality: Right;   Social History   Occupational History   Not on file  Tobacco Use   Smoking status: Never   Smokeless tobacco: Never  Vaping Use   Vaping Use: Never used  Substance and Sexual Activity   Alcohol use: Yes   Drug use: No   Sexual activity: Not on file

## 2020-12-17 ENCOUNTER — Other Ambulatory Visit: Payer: Self-pay | Admitting: Internal Medicine

## 2020-12-17 DIAGNOSIS — F5104 Psychophysiologic insomnia: Secondary | ICD-10-CM

## 2020-12-28 ENCOUNTER — Ambulatory Visit (INDEPENDENT_AMBULATORY_CARE_PROVIDER_SITE_OTHER): Payer: PPO

## 2020-12-28 DIAGNOSIS — Z23 Encounter for immunization: Secondary | ICD-10-CM | POA: Diagnosis not present

## 2021-01-15 ENCOUNTER — Other Ambulatory Visit: Payer: Self-pay | Admitting: Internal Medicine

## 2021-01-15 ENCOUNTER — Other Ambulatory Visit (HOSPITAL_COMMUNITY): Payer: Self-pay

## 2021-01-15 DIAGNOSIS — I10 Essential (primary) hypertension: Secondary | ICD-10-CM

## 2021-01-15 DIAGNOSIS — E119 Type 2 diabetes mellitus without complications: Secondary | ICD-10-CM

## 2021-01-15 MED ORDER — LISINOPRIL 10 MG PO TABS
10.0000 mg | ORAL_TABLET | Freq: Every day | ORAL | 0 refills | Status: DC
  Filled 2021-01-15: qty 90, 90d supply, fill #0

## 2021-01-18 ENCOUNTER — Telehealth: Payer: Self-pay | Admitting: Medical

## 2021-01-18 NOTE — Telephone Encounter (Signed)
Discussed carpal tunnel symptoms with Dr. Redmond School.  He requested injection for left CTS symptoms/left wrist.    Discussed risk and benefits of procedure.  Patient gives consent.  Cleaned and prepped area in usual sterile fashion.   Injected the left volar wrist medial to flexor carpi radialis tendon at the proximal wrist crease, sliding needle distally under mid point of retinaculum depositing steroid as a bolus.  Patient tolerated procedure well.

## 2021-01-24 ENCOUNTER — Other Ambulatory Visit (HOSPITAL_COMMUNITY): Payer: Self-pay

## 2021-01-24 ENCOUNTER — Other Ambulatory Visit: Payer: Self-pay | Admitting: Internal Medicine

## 2021-01-24 DIAGNOSIS — E785 Hyperlipidemia, unspecified: Secondary | ICD-10-CM

## 2021-01-24 MED ORDER — ATORVASTATIN CALCIUM 10 MG PO TABS
10.0000 mg | ORAL_TABLET | ORAL | 1 refills | Status: DC
  Filled 2021-01-24: qty 36, 84d supply, fill #0
  Filled 2021-04-03: qty 36, 84d supply, fill #1

## 2021-01-30 ENCOUNTER — Other Ambulatory Visit (HOSPITAL_COMMUNITY): Payer: Self-pay

## 2021-02-01 ENCOUNTER — Other Ambulatory Visit (HOSPITAL_COMMUNITY): Payer: Self-pay

## 2021-02-01 ENCOUNTER — Other Ambulatory Visit: Payer: Self-pay | Admitting: Medical

## 2021-02-01 MED ORDER — ONDANSETRON 4 MG PO TBDP
4.0000 mg | ORAL_TABLET | Freq: Three times a day (TID) | ORAL | 0 refills | Status: DC | PRN
  Filled 2021-02-01: qty 20, 7d supply, fill #0

## 2021-02-01 MED ORDER — DOXYCYCLINE HYCLATE 100 MG PO CAPS
100.0000 mg | ORAL_CAPSULE | Freq: Two times a day (BID) | ORAL | 0 refills | Status: DC
  Filled 2021-02-01: qty 20, 10d supply, fill #0

## 2021-02-06 ENCOUNTER — Other Ambulatory Visit (HOSPITAL_COMMUNITY): Payer: Self-pay

## 2021-02-26 ENCOUNTER — Other Ambulatory Visit (HOSPITAL_COMMUNITY): Payer: Self-pay

## 2021-03-06 LAB — HM DIABETES EYE EXAM

## 2021-04-01 ENCOUNTER — Other Ambulatory Visit: Payer: Self-pay | Admitting: Internal Medicine

## 2021-04-01 DIAGNOSIS — E119 Type 2 diabetes mellitus without complications: Secondary | ICD-10-CM

## 2021-04-01 DIAGNOSIS — I10 Essential (primary) hypertension: Secondary | ICD-10-CM

## 2021-04-01 MED ORDER — LISINOPRIL 10 MG PO TABS
10.0000 mg | ORAL_TABLET | Freq: Every day | ORAL | 0 refills | Status: DC
  Filled 2021-04-01: qty 90, 90d supply, fill #0

## 2021-04-02 ENCOUNTER — Other Ambulatory Visit (HOSPITAL_COMMUNITY): Payer: Self-pay

## 2021-04-03 ENCOUNTER — Other Ambulatory Visit (HOSPITAL_COMMUNITY): Payer: Self-pay

## 2021-05-21 LAB — LIPID PANEL
HDL: 34 — AB (ref 35–70)
LDL Cholesterol: 74
Triglycerides: 199 — AB (ref 40–160)

## 2021-05-21 LAB — BASIC METABOLIC PANEL
BUN: 15 (ref 4–21)
CO2: 24 — AB (ref 13–22)
Chloride: 104 (ref 99–108)
Creatinine: 0.9 (ref 0.6–1.3)
Glucose: 125
Potassium: 4.5 mEq/L (ref 3.5–5.1)
Sodium: 140 (ref 137–147)

## 2021-05-21 LAB — HEMOGLOBIN A1C: Hemoglobin A1C: 6.9

## 2021-05-21 LAB — HEPATIC FUNCTION PANEL
ALT: 41 U/L — AB (ref 10–40)
AST: 29 (ref 14–40)
Alkaline Phosphatase: 101 (ref 25–125)
Bilirubin, Total: 0.7

## 2021-05-21 LAB — CBC AND DIFFERENTIAL
HCT: 42 (ref 41–53)
Hemoglobin: 14.4 (ref 13.5–17.5)
Platelets: 250 10*3/uL (ref 150–400)
WBC: 7.1

## 2021-05-21 LAB — COMPREHENSIVE METABOLIC PANEL: Calcium: 9.3 (ref 8.7–10.7)

## 2021-05-21 LAB — TSH: TSH: 1.7 (ref 0.41–5.90)

## 2021-05-23 ENCOUNTER — Other Ambulatory Visit (HOSPITAL_COMMUNITY): Payer: Self-pay

## 2021-05-23 ENCOUNTER — Other Ambulatory Visit: Payer: Self-pay | Admitting: Internal Medicine

## 2021-05-23 DIAGNOSIS — E119 Type 2 diabetes mellitus without complications: Secondary | ICD-10-CM

## 2021-05-23 MED ORDER — TIRZEPATIDE 5 MG/0.5ML ~~LOC~~ SOAJ
5.0000 mg | SUBCUTANEOUS | 0 refills | Status: DC
  Filled 2021-05-23: qty 2, 28d supply, fill #0

## 2021-05-24 ENCOUNTER — Other Ambulatory Visit: Payer: Self-pay | Admitting: Internal Medicine

## 2021-05-24 ENCOUNTER — Other Ambulatory Visit (HOSPITAL_COMMUNITY): Payer: Self-pay

## 2021-05-24 DIAGNOSIS — E119 Type 2 diabetes mellitus without complications: Secondary | ICD-10-CM

## 2021-05-24 MED ORDER — OZEMPIC (1 MG/DOSE) 4 MG/3ML ~~LOC~~ SOPN
1.0000 mg | PEN_INJECTOR | SUBCUTANEOUS | 0 refills | Status: DC
Start: 2021-05-24 — End: 2021-06-12
  Filled 2021-05-24: qty 3, 28d supply, fill #0

## 2021-05-24 MED ORDER — INSULIN PEN NEEDLE 32G X 6 MM MISC
1.0000 | 1 refills | Status: DC
Start: 2021-05-24 — End: 2021-12-11
  Filled 2021-05-24: qty 50, fill #0
  Filled 2021-08-03: qty 100, 90d supply, fill #0
  Filled 2021-08-18: qty 100, 30d supply, fill #0

## 2021-05-27 ENCOUNTER — Encounter: Payer: Self-pay | Admitting: Family Medicine

## 2021-06-04 ENCOUNTER — Other Ambulatory Visit (HOSPITAL_COMMUNITY): Payer: Self-pay

## 2021-06-04 ENCOUNTER — Other Ambulatory Visit: Payer: Self-pay | Admitting: Internal Medicine

## 2021-06-04 DIAGNOSIS — E119 Type 2 diabetes mellitus without complications: Secondary | ICD-10-CM

## 2021-06-05 ENCOUNTER — Telehealth: Payer: Self-pay

## 2021-06-05 NOTE — Telephone Encounter (Signed)
-----   Message from Janith Lima, MD sent at 06/04/2021  4:01 PM EDT ----- ?Regarding: next wednesday ?Will you get him on my schedule next Wednesday? ? ?

## 2021-06-05 NOTE — Telephone Encounter (Signed)
Called pt, LVM to schedule.  

## 2021-06-06 ENCOUNTER — Ambulatory Visit: Payer: PPO | Admitting: Orthopedic Surgery

## 2021-06-06 ENCOUNTER — Encounter: Payer: Self-pay | Admitting: Orthopedic Surgery

## 2021-06-06 DIAGNOSIS — G5601 Carpal tunnel syndrome, right upper limb: Secondary | ICD-10-CM | POA: Diagnosis not present

## 2021-06-06 MED ORDER — LIDOCAINE HCL 1 % IJ SOLN
1.0000 mL | INTRAMUSCULAR | Status: AC | PRN
  Administered 2021-06-06: 1 mL

## 2021-06-06 MED ORDER — BETAMETHASONE SOD PHOS & ACET 6 (3-3) MG/ML IJ SUSP
6.0000 mg | INTRAMUSCULAR | Status: AC | PRN
  Administered 2021-06-06: 6 mg via INTRA_ARTICULAR

## 2021-06-06 NOTE — Progress Notes (Signed)
? ?Office Visit Note ?  ?Patient: Adam Ware, Dr.           ?Date of Birth: 1950/03/26           ?MRN: [id number] ?Visit Date: 06/06/2021 ?             ?Requested by: Janith Lima, MD ?[place],  [place] [id number] ?PCP: Janith Lima, MD ? ? ?Assessment & Plan: ?Visit Diagnoses:  ?1. Carpal tunnel syndrome, right upper limb   ? ? ?Plan: Patient was last seen in October for his carpal tunnel syndrome.  He went corticosteroid junction of the carpal tunnel at that time.  He got significant symptom relief for many months.  He has noted some numbness and paresthesias have returned just in the right hand.  He would like to repeat corticosteroid injection today.  We again discussed the risks benefits, and alternatives of steroid injection.  I can see him back in the office as needed. ? ?Follow-Up Instructions: No follow-ups on file.  ? ?Orders:  ?No orders of the defined types were placed in this encounter. ? ?No orders of the defined types were placed in this encounter. ? ? ? ? Procedures: ?Hand/UE Inj: R carpal tunnel for carpal tunnel syndrome on 06/06/2021 8:37 AM ?Indications: therapeutic ?Details: 25 G needle, volar approach ?Medications: 1 mL lidocaine 1 %; 6 mg betamethasone acetate-betamethasone sodium phosphate 6 (3-3) MG/ML ?Procedure, treatment alternatives, risks and benefits explained, specific risks discussed. Consent was given by the patient. Immediately prior to procedure a time out was called to verify the correct patient, procedure, equipment, support staff and site/side marked as required. Patient was prepped and draped in the usual sterile fashion.  ? ? ? ? ?Clinical Data: ?No additional findings. ? ? ?Subjective: ?Chief Complaint  ?Patient presents with  ? Right Hand - Follow-up  ? ? 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  He had many months of complete symptom resolution.  The paresthesias have  recently started back in the thumb, index, and middle fingers.  It is worse with certain certain activities.  He has numbness today in the office while scrolling on his phone.  His left hand symptoms have not returned.  He would like to repeat a corticosteroid action today. ? ? ?Review of Systems ? ? ?Objective: ?Vital Signs: There were no vitals taken for this visit. ? ?Physical Exam ?Constitutional:   ?   Appearance: Normal appearance.  ?Cardiovascular:  ?   Rate and Rhythm: Normal rate.  ?   Pulses: Normal pulses.  ?Pulmonary:  ?   Effort: Pulmonary effort is normal.  ?Skin: ?   General: Skin is warm and dry.  ?   Capillary Refill: Capillary refill takes less than 2 seconds.  ?Neurological:  ?   Mental Status: He is alert.  ? ? ?Right Hand Exam  ? ?Tenderness  ?The patient is experiencing no tenderness.  ? ?Range of Motion  ?The patient has normal right wrist ROM.  ? ?Tests  ?Phalen?s sign: positive ?Tinel's sign (median nerve): positive ? ?Other  ?Erythema: absent ?Sensation: normal ?Pulse: present ? ? ? ? ?Specialty Comments:  ?No specialty comments available. ? ?Imaging: ?No results found. ? ? ?PMFS History: ?Patient Active Problem List  ? Diagnosis Date Noted  ? Carpal tunnel syndrome, left upper limb 11/28/2020  ? Carpal tunnel syndrome, right upper limb 11/28/2020  ?  Erectile dysfunction due to arterial insufficiency 09/24/2020  ? Benign prostatic hyperplasia with weak urinary stream 05/05/2018  ? Hyperlipidemia LDL goal <100 05/05/2018  ? Pure hyperglyceridemia 05/05/2018  ? Psychophysiological insomnia 05/05/2018  ? Lumbar radiculopathy 07/15/2017  ? Hyperlipidemia associated with type 2 diabetes mellitus ([place]) 06/10/2017  ? Essential hypertension 06/10/2017  ? Type 2 diabetes mellitus without complication, without long-term current use of insulin ([place]) 06/10/2017  ? Routine general medical examination at a health care facility 06/10/2017  ? Spondylolisthesis, lumbar region 04/21/2017  ? ?Past Medical  History:  ?Diagnosis Date  ? Arthritis   ? Diabetes mellitus without complication ([place])   ? Hair loss   ? History of kidney stones   ? Hyperlipidemia   ?  ?Family History  ?Problem Relation Age of Onset  ? Cancer Mother   ? Diabetes Mother   ? Mental illness Mother   ? Heart disease Father   ? Arthritis Brother   ?  ?Past Surgical History:  ?Procedure Laterality Date  ? APPENDECTOMY    ? JOINT REPLACEMENT    ? LITHOTRIPSY    ? REPLACEMENT TOTAL KNEE Right   ? TOTAL KNEE REVISION Right 04/22/2017  ? Procedure: REVISION RIGHT TOTAL KNEE ARTHROPLASTY VS. POLY EXCHANGE;  Surgeon: Newt Minion, MD;  Location: [facility];  Service: Orthopedics;  Laterality: Right;  ? ?Social History  ? ?Occupational History  ? Not on file  ?Tobacco Use  ? Smoking status: Never  ? Smokeless tobacco: Never  ?Vaping Use  ? Vaping Use: Never used  ?Substance and Sexual Activity  ? Alcohol use: Yes  ? Drug use: No  ? Sexual activity: Not on file  ? ? ? ? ? ? ?

## 2021-06-12 ENCOUNTER — Ambulatory Visit (INDEPENDENT_AMBULATORY_CARE_PROVIDER_SITE_OTHER): Payer: PPO | Admitting: Internal Medicine

## 2021-06-12 ENCOUNTER — Other Ambulatory Visit (HOSPITAL_COMMUNITY): Payer: Self-pay

## 2021-06-12 ENCOUNTER — Encounter: Payer: Self-pay | Admitting: Internal Medicine

## 2021-06-12 VITALS — BP 128/74 | HR 74 | Temp 97.6°F | Resp 16 | Ht 73.0 in | Wt 206.0 lb

## 2021-06-12 DIAGNOSIS — E119 Type 2 diabetes mellitus without complications: Secondary | ICD-10-CM

## 2021-06-12 DIAGNOSIS — E785 Hyperlipidemia, unspecified: Secondary | ICD-10-CM

## 2021-06-12 DIAGNOSIS — N5201 Erectile dysfunction due to arterial insufficiency: Secondary | ICD-10-CM

## 2021-06-12 DIAGNOSIS — F5104 Psychophysiologic insomnia: Secondary | ICD-10-CM

## 2021-06-12 DIAGNOSIS — N401 Enlarged prostate with lower urinary tract symptoms: Secondary | ICD-10-CM

## 2021-06-12 DIAGNOSIS — R3912 Poor urinary stream: Secondary | ICD-10-CM

## 2021-06-12 DIAGNOSIS — I10 Essential (primary) hypertension: Secondary | ICD-10-CM | POA: Diagnosis not present

## 2021-06-12 DIAGNOSIS — Z0001 Encounter for general adult medical examination with abnormal findings: Secondary | ICD-10-CM | POA: Insufficient documentation

## 2021-06-12 DIAGNOSIS — E781 Pure hyperglyceridemia: Secondary | ICD-10-CM

## 2021-06-12 MED ORDER — ZOLPIDEM TARTRATE ER 6.25 MG PO TBCR
6.2500 mg | EXTENDED_RELEASE_TABLET | Freq: Every evening | ORAL | 1 refills | Status: DC | PRN
  Filled 2021-06-12: qty 90, 90d supply, fill #0

## 2021-06-12 MED ORDER — LISINOPRIL 10 MG PO TABS
10.0000 mg | ORAL_TABLET | Freq: Every day | ORAL | 0 refills | Status: DC
  Filled 2021-06-12: qty 90, 90d supply, fill #0

## 2021-06-12 MED ORDER — ATORVASTATIN CALCIUM 10 MG PO TABS
10.0000 mg | ORAL_TABLET | ORAL | 1 refills | Status: DC
  Filled 2021-06-12: qty 36, 84d supply, fill #0
  Filled 2021-08-03 – 2021-10-07 (×2): qty 36, 84d supply, fill #1

## 2021-06-12 MED ORDER — METFORMIN HCL 500 MG PO TABS
500.0000 mg | ORAL_TABLET | Freq: Every day | ORAL | 1 refills | Status: DC
  Filled 2021-06-12: qty 90, 90d supply, fill #0
  Filled 2021-08-16: qty 90, 90d supply, fill #1

## 2021-06-12 MED ORDER — FINASTERIDE 5 MG PO TABS
5.0000 mg | ORAL_TABLET | Freq: Every day | ORAL | 1 refills | Status: DC
  Filled 2021-06-12: qty 90, 90d supply, fill #0
  Filled 2021-08-16: qty 90, 90d supply, fill #1

## 2021-06-12 MED ORDER — SEMAGLUTIDE (2 MG/DOSE) 8 MG/3ML ~~LOC~~ SOPN
2.0000 mg | PEN_INJECTOR | SUBCUTANEOUS | 1 refills | Status: DC
  Filled 2021-06-12: qty 3, 28d supply, fill #0
  Filled 2021-07-08: qty 3, 28d supply, fill #1
  Filled 2021-08-03: qty 3, 28d supply, fill #2
  Filled 2021-09-02: qty 3, 28d supply, fill #3

## 2021-06-12 MED ORDER — TADALAFIL 20 MG PO TABS
20.0000 mg | ORAL_TABLET | Freq: Every day | ORAL | 1 refills | Status: DC | PRN
  Filled 2021-06-12: qty 30, 30d supply, fill #0

## 2021-06-12 NOTE — Patient Instructions (Signed)
Health Maintenance, Male Adopting a healthy lifestyle and getting preventive care are important in promoting health and wellness. Ask your health care provider about: The right schedule for you to have regular tests and exams. Things you can do on your own to prevent diseases and keep yourself healthy. What should I know about diet, weight, and exercise? Eat a healthy diet  Eat a diet that includes plenty of vegetables, fruits, low-fat dairy products, and lean protein. Do not eat a lot of foods that are high in solid fats, added sugars, or sodium. Maintain a healthy weight Body mass index (BMI) is a measurement that can be used to identify possible weight problems. It estimates body fat based on height and weight. Your health care provider can help determine your BMI and help you achieve or maintain a healthy weight. Get regular exercise Get regular exercise. This is one of the most important things you can do for your health. Most adults should: Exercise for at least 150 minutes each week. The exercise should increase your heart rate and make you sweat (moderate-intensity exercise). Do strengthening exercises at least twice a week. This is in addition to the moderate-intensity exercise. Spend less time sitting. Even light physical activity can be beneficial. Watch cholesterol and blood lipids Have your blood tested for lipids and cholesterol at 73 years of age, then have this test every 5 years. You may need to have your cholesterol levels checked more often if: Your lipid or cholesterol levels are high. You are older than 73 years of age. You are at high risk for heart disease. What should I know about cancer screening? Many types of cancers can be detected early and may often be prevented. Depending on your health history and family history, you may need to have cancer screening at various ages. This may include screening for: Colorectal cancer. Prostate cancer. Skin cancer. Lung  cancer. What should I know about heart disease, diabetes, and high blood pressure? Blood pressure and heart disease High blood pressure causes heart disease and increases the risk of stroke. This is more likely to develop in people who have high blood pressure readings or are overweight. Talk with your health care provider about your target blood pressure readings. Have your blood pressure checked: Every 3-5 years if you are 18-39 years of age. Every year if you are 40 years old or older. If you are between the ages of 65 and 75 and are a current or former smoker, ask your health care provider if you should have a one-time screening for abdominal aortic aneurysm (AAA). Diabetes Have regular diabetes screenings. This checks your fasting blood sugar level. Have the screening done: Once every three years after age 45 if you are at a normal weight and have a low risk for diabetes. More often and at a younger age if you are overweight or have a high risk for diabetes. What should I know about preventing infection? Hepatitis B If you have a higher risk for hepatitis B, you should be screened for this virus. Talk with your health care provider to find out if you are at risk for hepatitis B infection. Hepatitis C Blood testing is recommended for: Everyone born from 1945 through 1965. Anyone with known risk factors for hepatitis C. Sexually transmitted infections (STIs) You should be screened each year for STIs, including gonorrhea and chlamydia, if: You are sexually active and are younger than 73 years of age. You are older than 73 years of age and your   health care provider tells you that you are at risk for this type of infection. Your sexual activity has changed since you were last screened, and you are at increased risk for chlamydia or gonorrhea. Ask your health care provider if you are at risk. Ask your health care provider about whether you are at high risk for HIV. Your health care provider  may recommend a prescription medicine to help prevent HIV infection. If you choose to take medicine to prevent HIV, you should first get tested for HIV. You should then be tested every 3 months for as long as you are taking the medicine. Follow these instructions at home: Alcohol use Do not drink alcohol if your health care provider tells you not to drink. If you drink alcohol: Limit how much you have to 0-2 drinks a day. Know how much alcohol is in your drink. In the [place] one drink equals one 12 oz bottle of beer (355 mL), one 5 oz glass of wine (148 mL), or one 1 oz glass of hard liquor (44 mL). Lifestyle Do not use any products that contain nicotine or tobacco. These products include cigarettes, chewing tobacco, and vaping devices, such as e-cigarettes. If you need help quitting, ask your health care provider. Do not use street drugs. Do not share needles. Ask your health care provider for help if you need support or information about quitting drugs. General instructions Schedule regular health, dental, and eye exams. Stay current with your vaccines. Tell your health care provider if: You often feel depressed. You have ever been abused or do not feel safe at home. Summary Adopting a healthy lifestyle and getting preventive care are important in promoting health and wellness. Follow your health care provider's instructions about healthy diet, exercising, and getting tested or screened for diseases. Follow your health care provider's instructions on monitoring your cholesterol and blood pressure. This information is not intended to replace advice given to you by your health care provider. Make sure you discuss any questions you have with your health care provider. Document Revised: 06/25/2020 Document Reviewed: 06/25/2020 Elsevier Patient Education  2023 Elsevier Inc.  

## 2021-06-12 NOTE — Progress Notes (Signed)
? ?Subjective:  ?Patient ID: Adam Ware, Dr., male    DOB: November 20, 1950  Age: 71 y.o. MRN: [id number] ? ?CC: Annual Exam, Hypertension, Hyperlipidemia, and Diabetes ? ? 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 ?He plays golf, is very active, and denies chest pain, shortness of breath, diaphoresis, or edema. ? ?Outpatient Medications Prior to Visit  ?Medication Sig Dispense Refill  ? Insulin Pen Needle 32G X 6 MM MISC Use weekly as directed 50 each 1  ? atorvastatin (LIPITOR) 10 MG tablet Take 1 tablet (10 mg total) by mouth 3 (three) times a week. 90 tablet 1  ? finasteride (PROSCAR) 5 MG tablet Take 1 tablet (5 mg total) by mouth daily. 90 tablet 1  ? lisinopril (ZESTRIL) 10 MG tablet Take 1 tablet (10 mg total) by mouth daily. 90 tablet 0  ? metFORMIN (GLUCOPHAGE) 500 MG tablet Take 1 tablet (500 mg total) by mouth daily with breakfast. 90 tablet 1  ? Semaglutide, 1 MG/DOSE, (OZEMPIC, 1 MG/DOSE,) 4 MG/3ML SOPN Inject 1 mg into the skin once a week. 12 mL 0  ? tadalafil (CIALIS) 20 MG tablet Take 1 tablet (20 mg total) by mouth daily as needed for erectile dysfunction. 30 tablet 1  ? zolpidem (AMBIEN CR) 6.25 MG CR tablet TAKE ONE TABLET BY MOUTH EVERY NIGHT AT BEDTIME AS NEEDED FOR SLEEP 30 tablet 2  ? ?No facility-administered medications prior to visit.  ? ? ?ROS ?Review of Systems  ?Constitutional: Negative.  Negative for diaphoresis and fatigue.  ?HENT: Negative.    ?Eyes: Negative.   ?Respiratory:  Negative for cough, chest tightness, shortness of breath and wheezing.   ?Cardiovascular:  Negative for chest pain, palpitations and leg swelling.  ?Gastrointestinal:  Negative for abdominal pain and diarrhea.  ?Endocrine: Negative.   ?Genitourinary: Negative.  Negative for difficulty urinating.  ?Musculoskeletal:  Positive for arthralgias. Negative for myalgias.  ?Skin: Negative.   ?Neurological: Negative.  Negative for dizziness and speech difficulty.  ?Hematological:  Negative for adenopathy.  Does not bruise/bleed easily.  ?Psychiatric/Behavioral:  Positive for sleep disturbance. Negative for confusion, decreased concentration and dysphoric mood. The patient is not nervous/anxious.   ? ?Objective:  ?BP 128/74 (BP Location: Left Arm, Patient Position: Sitting, Cuff Size: Large)   Pulse 74   Temp 97.6 ?F (36.4 ?C) (Oral)   Resp 16   Ht '6\' 1"'$  (1.854 m)   Wt 206 lb (93.4 kg)   SpO2 96%   BMI 27.18 kg/m?  ? ?BP Readings from Last 3 Encounters:  ?06/12/21 128/74  ?09/24/20 132/84  ?05/04/19 138/78  ? ? ?Wt Readings from Last 3 Encounters:  ?06/12/21 206 lb (93.4 kg)  ?09/24/20 205 lb 3.2 oz (93.1 kg)  ?05/04/19 210 lb 8 oz (95.5 kg)  ? ? ?Physical Exam ?Vitals reviewed.  ?HENT:  ?   Nose: Nose normal.  ?   Mouth/Throat:  ?   Mouth: Mucous membranes are moist.  ?Eyes:  ?   General: No scleral icterus. ?   Conjunctiva/sclera: Conjunctivae normal.  ?Cardiovascular:  ?   Rate and Rhythm: Normal rate and regular rhythm.  ?   Heart sounds: No murmur heard. ?Pulmonary:  ?   Effort: Pulmonary effort is normal.  ?   Breath sounds: No stridor. No wheezing, rhonchi or rales.  ?Abdominal:  ?   General: Abdomen is flat.  ?   Palpations: There is no mass.  ?   Tenderness: There is no abdominal  tenderness. There is no guarding.  ?   Hernia: No hernia is present.  ?Musculoskeletal:     ?   General: Normal range of motion.  ?   Cervical back: Neck supple.  ?   Right lower leg: No edema.  ?   Left lower leg: No edema.  ?Lymphadenopathy:  ?   Cervical: No cervical adenopathy.  ?Skin: ?   General: Skin is warm and dry.  ?Neurological:  ?   General: No focal deficit present.  ?Psychiatric:     ?   Mood and Affect: Mood normal.     ?   Behavior: Behavior normal.  ? ? ?Lab Results  ?Component Value Date  ? WBC 8.8 09/24/2020  ? HGB 14.4 09/24/2020  ? HCT 44.3 09/24/2020  ? PLT 241 09/24/2020  ? GLUCOSE 102 (H) 09/24/2020  ? CHOL 163 09/24/2020  ? TRIG 374 (H) 09/24/2020  ? HDL 34 (L) 09/24/2020  ? LDLDIRECT 59.0 05/05/2018  ?  [place] 70 09/24/2020  ? ALT 29 09/24/2020  ? AST 21 09/24/2020  ? NA 141 09/24/2020  ? K 4.4 09/24/2020  ? CL 102 09/24/2020  ? CREATININE 0.74 (L) 09/24/2020  ? BUN 12 09/24/2020  ? CO2 23 09/24/2020  ? TSH 1.340 09/24/2020  ? PSA 0.3 11/10/2018  ? INR 1.05 04/20/2017  ? HGBA1C 6.4 (H) 09/24/2020  ? MICROALBUR 1.0 05/05/2018  ? ? ?CT CARDIAC CALCIUM SCORING (DOCTOR'S DAY ONLY) ? ?Addendum Date: 06/27/2020   ?ADDENDUM REPORT: 06/27/2020 14:15 CLINICAL DATA:  Cardiovascular Disease Risk stratification EXAM: Coronary Calcium Score TECHNIQUE: A gated, non-contrast computed tomography scan of the heart was performed using 59m slice thickness. Axial images were analyzed on a dedicated workstation. Calcium scoring of the coronary arteries was performed using the Agatston method. FINDINGS: Coronary arteries: Normal origins. Coronary Calcium Score: Left main: 0 Left anterior descending artery: 57 Left circumflex artery: 15 Right coronary artery: 0 Total: 72 Percentile: 34 Pericardium: Normal. Ascending Aorta: Mild dilatation of ascending aorta measuring 473mNon-cardiac: [facility] IMPRESSION: 1. Coronary calcium score of 72. This was 34th percentile for age-, race-, and sex-matched controls. 2.  Mild dilatation of ascending aorta measuring 4025mECOMMENDATIONS: Coronary artery calcium (CAC) score is a strong predictor of incident coronary heart disease (CHD) and provides predictive information beyond traditional risk factors. CAC scoring is reasonable to use in the decision to withhold, postpone, or initiate statin therapy in intermediate-risk or selected borderline-risk asymptomatic adults (age 35-20-75ars and LDL-C >=70 to <190 mg/dL) who do not have diabetes or established atherosclerotic cardiovascular disease (ASCVD).* In intermediate-risk (10-year ASCVD risk >=7.5% to <20%) adults or selected borderline-risk (10-year ASCVD risk >=5% to <7.5%) adults in whom a CAC score is measured  for the purpose of making a treatment decision the following recommendations have been made: If CAC=0, it is reasonable to withhold statin therapy and reassess in 5 to 10 years, as long as higher risk conditions are absent (diabetes mellitus, family history of premature CHD in first degree relatives (males <55 years; females <65 years), cigarette smoking, or LDL >=190 mg/dL). If CAC is 1 to 99, it is reasonable to initiate statin therapy for patients >=55 55ars of age. If CAC is >=100 or >=75th percentile, it is reasonable to initiate statin therapy at any age. Cardiology referral should be considered for patients with CAC scores >=400 or >=75th percentile. *2018 AHA/ACC/AACVPR/AAPA/ABC/ACPM/ADA/AGS/APhA/ASPC/NLA/PCNA Guideline on the Management of Blood Cholesterol: A Report of the [employer]  Cardiology/American Heart Association Task Force on Clinical Practice Guidelines. J Am Coll Cardiol. 2019;73(24):3168-3209. Oswaldo Milian, MD Electronically Signed   By: Oswaldo Milian MD   On: 06/27/2020 14:15  ? ?Result Date: 06/27/2020 ?EXAM: OVER-READ INTERPRETATION  CT CHEST The following report is an over-read performed by radiologist Dr. Aletta Edouard of [facility] [place] on 06/27/2020. This over-read does not include interpretation of cardiac or coronary anatomy or pathology. The coronary calcium score interpretation by the cardiologist is attached. COMPARISON:  None. FINDINGS: Vascular: No incidental noncardiac vascular findings in the imaged chest. Mediastinum/Nodes: Visualized mediastinum and hilar regions demonstrate no lymphadenopathy or masses. Lungs/Pleura: Visualized lungs show no evidence of pulmonary edema, consolidation, pneumothorax, nodule or pleural fluid. Upper Abdomen: No acute abnormality. Musculoskeletal: No chest wall mass or suspicious bone lesions identified. IMPRESSION: No significant incidental findings. Electronically Signed: By: Aletta Edouard M.D. On: 06/27/2020  08:56  ? ? ?Assessment & Plan:  ? ?Cyree was seen today for annual exam, hypertension, hyperlipidemia and diabetes. ? ?Diagnoses and all orders for this visit: ? ?Encounter for general adult medical examinati

## 2021-07-08 ENCOUNTER — Other Ambulatory Visit: Payer: Self-pay | Admitting: Internal Medicine

## 2021-07-08 ENCOUNTER — Other Ambulatory Visit (HOSPITAL_COMMUNITY): Payer: Self-pay

## 2021-08-03 ENCOUNTER — Other Ambulatory Visit (HOSPITAL_COMMUNITY): Payer: Self-pay

## 2021-08-07 ENCOUNTER — Other Ambulatory Visit (HOSPITAL_COMMUNITY): Payer: Self-pay

## 2021-08-08 ENCOUNTER — Ambulatory Visit: Payer: PPO | Admitting: Orthopedic Surgery

## 2021-08-08 DIAGNOSIS — G5601 Carpal tunnel syndrome, right upper limb: Secondary | ICD-10-CM

## 2021-08-08 MED ORDER — BETAMETHASONE SOD PHOS & ACET 6 (3-3) MG/ML IJ SUSP
6.0000 mg | INTRAMUSCULAR | Status: AC | PRN
  Administered 2021-08-08: 6 mg via INTRA_ARTICULAR

## 2021-08-08 MED ORDER — LIDOCAINE HCL 1 % IJ SOLN
1.0000 mL | INTRAMUSCULAR | Status: AC | PRN
  Administered 2021-08-08: 1 mL

## 2021-08-14 ENCOUNTER — Other Ambulatory Visit (HOSPITAL_COMMUNITY): Payer: Self-pay

## 2021-08-16 ENCOUNTER — Other Ambulatory Visit (HOSPITAL_COMMUNITY): Payer: Self-pay

## 2021-08-16 ENCOUNTER — Other Ambulatory Visit: Payer: Self-pay | Admitting: Internal Medicine

## 2021-08-16 DIAGNOSIS — E119 Type 2 diabetes mellitus without complications: Secondary | ICD-10-CM

## 2021-08-16 DIAGNOSIS — I10 Essential (primary) hypertension: Secondary | ICD-10-CM

## 2021-08-16 MED ORDER — LISINOPRIL 10 MG PO TABS
10.0000 mg | ORAL_TABLET | Freq: Every day | ORAL | 1 refills | Status: DC
  Filled 2021-08-16 – 2021-08-18 (×2): qty 90, 90d supply, fill #0
  Filled 2021-12-24 – 2022-01-19 (×2): qty 90, 90d supply, fill #1

## 2021-08-19 ENCOUNTER — Other Ambulatory Visit (HOSPITAL_COMMUNITY): Payer: Self-pay

## 2021-08-24 ENCOUNTER — Other Ambulatory Visit (HOSPITAL_COMMUNITY): Payer: Self-pay

## 2021-08-26 ENCOUNTER — Other Ambulatory Visit: Payer: Self-pay | Admitting: Family Medicine

## 2021-08-26 ENCOUNTER — Other Ambulatory Visit (HOSPITAL_COMMUNITY): Payer: Self-pay

## 2021-08-26 MED ORDER — ALBUTEROL SULFATE HFA 108 (90 BASE) MCG/ACT IN AERS
2.0000 | INHALATION_SPRAY | Freq: Four times a day (QID) | RESPIRATORY_TRACT | 0 refills | Status: DC | PRN
  Filled 2021-08-26: qty 18, 25d supply, fill #0

## 2021-08-26 MED ORDER — HYDROCODONE-ACETAMINOPHEN 5-325 MG PO TABS
1.0000 | ORAL_TABLET | Freq: Four times a day (QID) | ORAL | 0 refills | Status: DC | PRN
  Filled 2021-08-26: qty 10, 2d supply, fill #0

## 2021-08-26 NOTE — Progress Notes (Signed)
Requesting medications for travel (traveling as doc for sports team)--albuterol and vicodin, to have on hand, if needed for players.

## 2021-08-27 ENCOUNTER — Other Ambulatory Visit: Payer: Self-pay | Admitting: Family Medicine

## 2021-08-27 ENCOUNTER — Other Ambulatory Visit (HOSPITAL_COMMUNITY): Payer: Self-pay

## 2021-08-27 MED ORDER — AZITHROMYCIN 500 MG PO TABS
ORAL_TABLET | ORAL | 0 refills | Status: DC
  Filled 2021-08-27: qty 6, 6d supply, fill #0

## 2021-08-27 NOTE — Progress Notes (Signed)
Requested Rx for #6 of '500mg'$  azithro--traveling to [place], was told by Health Dept this is the recommendation for treating traveler's diarrhea (if treatment needed), '1000mg'$  once. Sent in #6 as requested, to cover 3 potential athletes/patients with illness.

## 2021-08-28 ENCOUNTER — Other Ambulatory Visit (HOSPITAL_COMMUNITY): Payer: Self-pay

## 2021-08-28 MED ORDER — ATOVAQUONE-PROGUANIL HCL 250-100 MG PO TABS
ORAL_TABLET | ORAL | 0 refills | Status: DC
  Filled 2021-08-28: qty 20, 20d supply, fill #0

## 2021-08-28 MED ORDER — AZITHROMYCIN 500 MG PO TABS
ORAL_TABLET | ORAL | 0 refills | Status: DC
  Filled 2021-08-28: qty 2, 2d supply, fill #0

## 2021-09-02 ENCOUNTER — Other Ambulatory Visit (HOSPITAL_COMMUNITY): Payer: Self-pay

## 2021-09-11 ENCOUNTER — Other Ambulatory Visit (HOSPITAL_COMMUNITY): Payer: Self-pay

## 2021-10-01 ENCOUNTER — Other Ambulatory Visit (HOSPITAL_COMMUNITY): Payer: Self-pay

## 2021-10-01 ENCOUNTER — Other Ambulatory Visit: Payer: Self-pay | Admitting: Internal Medicine

## 2021-10-01 DIAGNOSIS — E119 Type 2 diabetes mellitus without complications: Secondary | ICD-10-CM

## 2021-10-01 MED ORDER — OZEMPIC (2 MG/DOSE) 8 MG/3ML ~~LOC~~ SOPN
2.0000 mg | PEN_INJECTOR | SUBCUTANEOUS | 0 refills | Status: DC
  Filled 2021-10-01: qty 3, 28d supply, fill #0

## 2021-10-02 ENCOUNTER — Other Ambulatory Visit (HOSPITAL_COMMUNITY): Payer: Self-pay

## 2021-10-07 ENCOUNTER — Other Ambulatory Visit (HOSPITAL_COMMUNITY): Payer: Self-pay

## 2021-11-11 ENCOUNTER — Encounter: Payer: Self-pay | Admitting: Internal Medicine

## 2021-11-26 ENCOUNTER — Ambulatory Visit: Payer: PPO | Admitting: Sports Medicine

## 2021-11-26 ENCOUNTER — Ambulatory Visit: Payer: Self-pay

## 2021-11-26 ENCOUNTER — Encounter: Payer: Self-pay | Admitting: Sports Medicine

## 2021-11-26 DIAGNOSIS — M25511 Pain in right shoulder: Secondary | ICD-10-CM | POA: Diagnosis not present

## 2021-11-26 MED ORDER — METHYLPREDNISOLONE ACETATE 40 MG/ML IJ SUSP
40.0000 mg | INTRAMUSCULAR | Status: AC | PRN
  Administered 2021-11-26: 40 mg via INTRA_ARTICULAR

## 2021-11-26 MED ORDER — BUPIVACAINE HCL 0.25 % IJ SOLN
2.0000 mL | INTRAMUSCULAR | Status: AC | PRN
  Administered 2021-11-26: 2 mL via INTRA_ARTICULAR

## 2021-11-26 MED ORDER — LIDOCAINE HCL 1 % IJ SOLN
2.0000 mL | INTRAMUSCULAR | Status: AC | PRN
  Administered 2021-11-26: 2 mL

## 2021-11-26 NOTE — Progress Notes (Signed)
   Procedure Note  Patient: Adam Ware, Dr.             Date of Birth: 1950/07/30           MRN: [id number]             Visit Date: 11/26/2021  Procedures: Visit Diagnoses:  1. Right shoulder pain, unspecified chronicity    Large Joint Inj: R subacromial bursa on 11/26/2021 8:24 AM Details: 22 G 1.5 in needle, ultrasound-guided lateral approach Medications: 2 mL lidocaine 1 %; 2 mL bupivacaine 0.25 %; 40 mg methylPREDNISolone acetate 40 MG/ML Outcome: tolerated well, no immediate complications  US-Guided Subacromial Injection, Right Shoulder  After discussion on risks/benefits/indications, informed verbal consent was obtained. A timeout was then performed. Patient was seated on table in exam room. The patient's shoulder was prepped with betadine and alcohol swabs and utilizing lateral approach with ultrasound guidance, the patient's subacromial space was injected with 2:2:1 mixture of lidocaine:bupivicaine:depomedrol via an in-plane approach. Patient tolerated the procedure well without immediate complications.   Consent was given by the patient. Patient was prepped and draped in the usual sterile fashion.     - will follow-up as needed  Elba Barman, DO [place]  This note was dictated using Dragon naturally speaking software and may contain errors in syntax, spelling, or content which have not been identified prior to signing this note.

## 2021-12-03 ENCOUNTER — Other Ambulatory Visit: Payer: Self-pay | Admitting: Internal Medicine

## 2021-12-03 DIAGNOSIS — N5201 Erectile dysfunction due to arterial insufficiency: Secondary | ICD-10-CM

## 2021-12-03 MED ORDER — TADALAFIL 20 MG PO TABS: 20.0000 mg | ORAL_TABLET | Freq: Every day | ORAL | 1 refills | Status: DC | PRN

## 2021-12-09 ENCOUNTER — Encounter: Payer: Self-pay | Admitting: Internal Medicine

## 2021-12-09 ENCOUNTER — Other Ambulatory Visit (INDEPENDENT_AMBULATORY_CARE_PROVIDER_SITE_OTHER): Payer: PPO

## 2021-12-09 DIAGNOSIS — Z23 Encounter for immunization: Secondary | ICD-10-CM | POA: Diagnosis not present

## 2021-12-09 LAB — HEMOGLOBIN A1C: Hemoglobin A1C: 6.6

## 2021-12-11 ENCOUNTER — Encounter: Payer: Self-pay | Admitting: Internal Medicine

## 2021-12-11 ENCOUNTER — Ambulatory Visit (INDEPENDENT_AMBULATORY_CARE_PROVIDER_SITE_OTHER): Payer: PPO | Admitting: Internal Medicine

## 2021-12-11 ENCOUNTER — Other Ambulatory Visit (HOSPITAL_COMMUNITY): Payer: Self-pay

## 2021-12-11 VITALS — BP 134/78 | HR 78 | Temp 97.8°F | Resp 16 | Ht 73.0 in | Wt 202.0 lb

## 2021-12-11 DIAGNOSIS — Z0001 Encounter for general adult medical examination with abnormal findings: Secondary | ICD-10-CM

## 2021-12-11 DIAGNOSIS — E119 Type 2 diabetes mellitus without complications: Secondary | ICD-10-CM | POA: Diagnosis not present

## 2021-12-11 DIAGNOSIS — R3912 Poor urinary stream: Secondary | ICD-10-CM

## 2021-12-11 DIAGNOSIS — N401 Enlarged prostate with lower urinary tract symptoms: Secondary | ICD-10-CM

## 2021-12-11 DIAGNOSIS — I10 Essential (primary) hypertension: Secondary | ICD-10-CM | POA: Diagnosis not present

## 2021-12-11 DIAGNOSIS — R002 Palpitations: Secondary | ICD-10-CM

## 2021-12-11 DIAGNOSIS — E785 Hyperlipidemia, unspecified: Secondary | ICD-10-CM

## 2021-12-11 LAB — MICROALBUMIN / CREATININE URINE RATIO
Creatinine,U: 113.4 mg/dL
Microalb Creat Ratio: 1 mg/g (ref 0.0–30.0)
Microalb, Ur: 1.1 mg/dL (ref 0.0–1.9)

## 2021-12-11 LAB — URINALYSIS, ROUTINE W REFLEX MICROSCOPIC
Bilirubin Urine: NEGATIVE
Hgb urine dipstick: NEGATIVE
Ketones, ur: NEGATIVE
Leukocytes,Ua: NEGATIVE
Nitrite: NEGATIVE
Specific Gravity, Urine: 1.025 (ref 1.000–1.030)
Total Protein, Urine: NEGATIVE
Urine Glucose: 100 — AB
Urobilinogen, UA: 0.2 (ref 0.0–1.0)
pH: 5.5 (ref 5.0–8.0)

## 2021-12-11 LAB — BASIC METABOLIC PANEL
BUN: 16 mg/dL (ref 6–23)
CO2: 29 mEq/L (ref 19–32)
Calcium: 9 mg/dL (ref 8.4–10.5)
Chloride: 103 mEq/L (ref 96–112)
Creatinine, Ser: 0.76 mg/dL (ref 0.40–1.50)
GFR: 89.15 mL/min (ref >60.00)
Glucose, Bld: 126 mg/dL — ABNORMAL HIGH (ref 70–99)
Potassium: 4.1 mEq/L (ref 3.5–5.1)
Sodium: 138 mEq/L (ref 135–145)

## 2021-12-11 LAB — PSA: PSA: 0.31 ng/mL (ref 0.10–4.00)

## 2021-12-11 MED ORDER — ATORVASTATIN CALCIUM 10 MG PO TABS
10.0000 mg | ORAL_TABLET | ORAL | 1 refills | Status: DC
  Filled 2021-12-11: qty 36, 84d supply, fill #0
  Filled 2021-12-24 – 2022-03-27 (×3): qty 36, 84d supply, fill #1
  Filled 2022-03-28: qty 36, 84d supply, fill #0
  Filled 2022-06-11: qty 36, 84d supply, fill #1
  Filled 2022-09-07: qty 36, 84d supply, fill #2
  Filled 2022-11-30 – 2022-12-08 (×2): qty 36, 84d supply, fill #3

## 2021-12-11 NOTE — Patient Instructions (Signed)
Palpitations Palpitations are feelings that your heartbeat is irregular or is faster than normal. It may feel like your heart is fluttering or skipping a beat. Palpitations may be caused by many things, including smoking, caffeine, alcohol, stress, and certain medicines or drugs. Most causes of palpitations are not serious.  However, some palpitations can be a sign of a serious problem. Further tests and a thorough medical history will be done to find the cause of your palpitations. Your provider may order tests such as an ECG, labs, an echocardiogram, or an ambulatory continuous ECG monitor. Follow these instructions at home: Pay attention to any changes in your symptoms. Let your health care provider know about them. Take these actions to help manage your symptoms: Eating and drinking Follow instructions from your health care provider about eating or drinking restrictions. You may need to avoid foods and drinks that may cause palpitations. These may include: Caffeinated coffee, tea, soft drinks, and energy drinks. Chocolate. Alcohol. Diet pills. Lifestyle     Take steps to reduce your stress and anxiety. Things that can help you relax include: Yoga. Mind-body activities, such as deep breathing, meditation, or using words and images to create positive thoughts (guided imagery). Physical activity, such as swimming, jogging, or walking. Tell your health care provider if your palpitations increase with activity. If you have chest pain or shortness of breath with activity, do not continue the activity until you are seen by your health care provider. Biofeedback. This is a method that helps you learn to use your mind to control things in your body, such as your heartbeat. Get plenty of rest and sleep. Keep a regular bed time. Do not use drugs, including cocaine or ecstasy. Do not use marijuana. Do not use any products that contain nicotine or tobacco. These products include cigarettes, chewing  tobacco, and vaping devices, such as e-cigarettes. If you need help quitting, ask your health care provider. General instructions Take over-the-counter and prescription medicines only as told by your health care provider. Keep all follow-up visits. This is important. These may include visits for further testing if palpitations do not go away or get worse. Contact a health care provider if: You continue to have a fast or irregular heartbeat for a long period of time. You notice that your palpitations occur more often. Get help right away if: You have chest pain or shortness of breath. You have a severe headache. You feel dizzy or you faint. These symptoms may represent a serious problem that is an emergency. Do not wait to see if the symptoms will go away. Get medical help right away. Call your local emergency services (911 in the [place] Do not drive yourself to the hospital. Summary Palpitations are feelings that your heartbeat is irregular or is faster than normal. It may feel like your heart is fluttering or skipping a beat. Palpitations may be caused by many things, including smoking, caffeine, alcohol, stress, certain medicines, and drugs. Further tests and a thorough medical history may be done to find the cause of your palpitations. Get help right away if you faint or have chest pain, shortness of breath, severe headache, or dizziness. This information is not intended to replace advice given to you by your health care provider. Make sure you discuss any questions you have with your health care provider. Document Revised: 06/27/2020 Document Reviewed: 06/27/2020 Elsevier Patient Education  2023 Elsevier Inc.  

## 2021-12-11 NOTE — Progress Notes (Signed)
Subjective:  Patient ID: Adam Ware, Dr., male    DOB: Aug 03, 1950  Age: 71 y.o. 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  About once a week he notices skipped beats and then possibly a pause to be.  He reports that a recent EKG was normal.  He is active and denies chest pain, shortness of breath, dyspnea on exertion, dizziness, lightheadedness, or edema.  Outpatient Medications Prior to Visit  Medication Sig Dispense Refill   finasteride (PROSCAR) 5 MG tablet Take 1 tablet (5 mg total) by mouth daily. 90 tablet 1   lisinopril (ZESTRIL) 10 MG tablet Take 1 tablet by mouth daily. 90 tablet 1   Semaglutide, 2 MG/DOSE, (OZEMPIC, 2 MG/DOSE,) 8 MG/3ML SOPN Inject 2 mg as directed once a week. 9 mL 0   tadalafil (CIALIS) 20 MG tablet Take 1 tablet (20 mg total) by mouth daily as needed for erectile dysfunction. 30 tablet 1   zolpidem (AMBIEN CR) 6.25 MG CR tablet Take 1 tablet (6.25 mg total) by mouth at bedtime as needed for sleep. 90 tablet 1   albuterol (VENTOLIN HFA) 108 (90 Base) MCG/ACT inhaler Inhale 2 puffs into the lungs every 6 (six) hours as needed for wheezing or shortness of breath. 18 g 0   atorvastatin (LIPITOR) 10 MG tablet Take 1 tablet (10 mg total) by mouth 3 (three) times a week. 90 tablet 1   atovaquone-proguanil (MALARONE) 250-100 MG TABS tablet TAKE 1 TABLET BY  MOUTH EVERY DAY FOR MALARIA PREVENTION, STARTING 2 DAYS BEFORE DEPARTURE. TAKE DAILY WHILE IN MALARIOUS AREA AND CONTINUE TO TAKE DAILY FOR 1 WEEK AFTER RETURN. 20 tablet 0   azithromycin (ZITHROMAX) 500 MG tablet Take as directed 6 tablet 0   azithromycin (ZITHROMAX) 500 MG tablet Take 2 tablets by mouth at once for moderate to severe diarrhea 2 tablet 0   HYDROcodone-acetaminophen (NORCO/VICODIN) 5-325 MG tablet Take 1-2 tablets by mouth every 6 (six) hours as needed for moderate pain.  10 tablet 0   Insulin Pen Needle 32G X 6 MM MISC Use weekly as directed 50 each 1   metFORMIN (GLUCOPHAGE) 500 MG tablet Take 1 tablet (500 mg total) by mouth daily with breakfast. 90 tablet 1   No facility-administered medications prior to visit.    ROS Review of Systems  Constitutional:  Negative for appetite change, diaphoresis, fatigue and unexpected weight change.  HENT: Negative.    Eyes: Negative.   Respiratory:  Negative for chest tightness, shortness of breath and wheezing.   Cardiovascular:  Positive for palpitations. Negative for chest pain and leg swelling.  Gastrointestinal:  Negative for abdominal pain, diarrhea, nausea and vomiting.  Endocrine: Negative.   Genitourinary: Negative.  Negative for difficulty urinating.  Musculoskeletal:  Negative for arthralgias and myalgias.  Skin: Negative.   Neurological:  Negative for dizziness, weakness, numbness and headaches.  Hematological:  Negative for adenopathy. Does not bruise/bleed easily.  Psychiatric/Behavioral: Negative.      Objective:  BP 134/78 (BP Location: Left Arm, Patient Position: Sitting, Cuff Size: Large)   Pulse 78   Temp 97.8 F (36.6 C) (Oral)   Resp 16   Ht '6\' 1"'$  (1.854 m)   Wt 202 lb (91.6 kg)   SpO2 97%   BMI 26.65 kg/m   BP Readings from Last 3 Encounters:  12/11/21 134/78  06/12/21 128/74  09/24/20 132/84    Wt Readings from Last 3 Encounters:  12/11/21 202 lb (91.6 kg)  06/12/21 206 lb (93.4 kg)  09/24/20 205 lb 3.2 oz (93.1 kg)    Physical Exam Vitals reviewed.  Constitutional:      Appearance: Normal appearance.  HENT:     Mouth/Throat:     Mouth: Mucous membranes are moist.  Eyes:     General: No scleral icterus.    Conjunctiva/sclera: Conjunctivae normal.  Cardiovascular:     Rate and Rhythm: Normal rate and regular rhythm.     Heart sounds: No murmur heard. Pulmonary:     Effort: Pulmonary effort is normal.     Breath sounds: No stridor. No wheezing, rhonchi or rales.   Abdominal:     General: Abdomen is flat.     Palpations: There is no mass.     Tenderness: There is no abdominal tenderness. There is no guarding.     Hernia: No hernia is present.  Musculoskeletal:        General: Normal range of motion.     Cervical back: Neck supple.     Right lower leg: No edema.     Left lower leg: No edema.  Lymphadenopathy:     Cervical: No cervical adenopathy.  Skin:    General: Skin is warm and dry.  Neurological:     General: No focal deficit present.     Mental Status: He is alert.  Psychiatric:        Mood and Affect: Mood normal.        Behavior: Behavior normal.     Lab Results  Component Value Date   WBC 7.1 05/21/2021   HGB 14.4 05/21/2021   HCT 42 05/21/2021   PLT 250 05/21/2021   GLUCOSE 126 (H) 12/11/2021   CHOL 163 09/24/2020   TRIG 199 (A) 05/21/2021   HDL 34 (A) 05/21/2021   LDLDIRECT 59.0 05/05/2018   LDLCALC 74 05/21/2021   ALT 41 (A) 05/21/2021   AST 29 05/21/2021   NA 138 12/11/2021   K 4.1 12/11/2021   CL 103 12/11/2021   CREATININE 0.76 12/11/2021   BUN 16 12/11/2021   CO2 29 12/11/2021   TSH 1.70 05/21/2021   PSA 0.31 12/11/2021   INR 1.05 04/20/2017   HGBA1C 6.6 12/09/2021   MICROALBUR 1.1 12/11/2021    CT CARDIAC CALCIUM SCORING (DOCTOR'S DAY ONLY)  Addendum Date: 06/27/2020   ADDENDUM REPORT: 06/27/2020 14:15 CLINICAL DATA:  Cardiovascular Disease Risk stratification EXAM: Coronary Calcium Score TECHNIQUE: A gated, non-contrast computed tomography scan of the heart was performed using 1m slice thickness. Axial images were analyzed on a dedicated workstation. Calcium scoring of the coronary arteries was performed using the Agatston method. FINDINGS: Coronary arteries: Normal origins. Coronary Calcium Score: Left main: 0 Left anterior descending artery: 57 Left circumflex artery: 15 Right coronary artery: 0 Total: 72 Percentile: 34 Pericardium: Normal. Ascending Aorta: Mild dilatation of ascending aorta measuring  46mNon-cardiac: [facility] IMPRESSION: 1. Coronary calcium score of 72. This was 34th percentile for age-, race-, and sex-matched controls. 2.  Mild dilatation of ascending aorta measuring 4058mECOMMENDATIONS: Coronary artery calcium (CAC) score is a strong predictor of incident coronary heart disease (CHD) and provides predictive information beyond traditional risk factors. CAC scoring is reasonable to use in the decision to withhold, postpone, or initiate statin therapy in intermediate-risk or selected borderline-risk asymptomatic adults (age 40-35-75ars and LDL-C >=70 to <  190 mg/dL) who do not have diabetes or established atherosclerotic cardiovascular disease (ASCVD).* In intermediate-risk (10-year ASCVD risk >=7.5% to <20%) adults or selected borderline-risk (10-year ASCVD risk >=5% to <7.5%) adults in whom a CAC score is measured for the purpose of making a treatment decision the following recommendations have been made: If CAC=0, it is reasonable to withhold statin therapy and reassess in 5 to 10 years, as long as higher risk conditions are absent (diabetes mellitus, family history of premature CHD in first degree relatives (males <55 years; females <65 years), cigarette smoking, or LDL >=190 mg/dL). If CAC is 1 to 99, it is reasonable to initiate statin therapy for patients >=54 years of age. If CAC is >=100 or >=75th percentile, it is reasonable to initiate statin therapy at any age. Cardiology referral should be considered for patients with CAC scores >=400 or >=75th percentile. *2018 AHA/ACC/AACVPR/AAPA/ABC/ACPM/ADA/AGS/APhA/ASPC/NLA/PCNA Guideline on the Management of Blood Cholesterol: A Report of the American College of Cardiology/American Heart Association Task Force on Clinical Practice Guidelines. J Am Coll Cardiol. 2019;73(24):3168-3209. Oswaldo Milian, MD Electronically Signed   By: Oswaldo Milian MD   On: 06/27/2020 14:15   Result Date:  06/27/2020 EXAM: OVER-READ INTERPRETATION  CT CHEST The following report is an over-read performed by radiologist Dr. Aletta Edouard of [facility], [place] on 06/27/2020. This over-read does not include interpretation of cardiac or coronary anatomy or pathology. The coronary calcium score interpretation by the cardiologist is attached. COMPARISON:  None. FINDINGS: Vascular: No incidental noncardiac vascular findings in the imaged chest. Mediastinum/Nodes: Visualized mediastinum and hilar regions demonstrate no lymphadenopathy or masses. Lungs/Pleura: Visualized lungs show no evidence of pulmonary edema, consolidation, pneumothorax, nodule or pleural fluid. Upper Abdomen: No acute abnormality. Musculoskeletal: No chest wall mass or suspicious bone lesions identified. IMPRESSION: No significant incidental findings. Electronically Signed: By: Aletta Edouard M.D. On: 06/27/2020 08:56    Assessment & Plan:   Ronie was seen today for hypertension, diabetes and annual exam.  Diagnoses and all orders for this visit:  Essential hypertension- His blood pressure is well controlled. -     Basic metabolic panel; Future -     Urinalysis, Routine w reflex microscopic; Future -     Urinalysis, Routine w reflex microscopic -     Basic metabolic panel  Type 2 diabetes mellitus without complication, without long-term current use of insulin ([place])- His blood sugar is well controlled. -     Basic metabolic panel; Future -     Microalbumin / creatinine urine ratio; Future -     HM Diabetes Foot Exam -     Microalbumin / creatinine urine ratio -     Basic metabolic panel -     metFORMIN (GLUCOPHAGE-XR) 500 MG 24 hr tablet; Take 1 tablet (500 mg total) by mouth daily with breakfast.  Benign prostatic hyperplasia with weak urinary stream- PSA is normal. -     PSA; Future -     PSA  Intermittent palpitations-I recommended he wear a cardiac event monitor to evaluate for dysrhythmia. -     CARDIAC EVENT MONITOR;  Future  Hyperlipidemia LDL goal <100- LDL goal achieved. Doing well on the statin  -     atorvastatin (LIPITOR) 10 MG tablet; Take 1 tablet (10 mg total) by mouth 3 (three) times a week.  Encounter for general adult medical examination with abnormal findings- Exam completed, labs reviewed, vaccines are up-to-date, cancer screenings are up-to-date, patient education was given.   I have discontinued [employer], Dr[employer]s Insulin Pen Needle, metFORMIN, HYDROcodone-acetaminophen, albuterol, azithromycin, azithromycin, and atovaquone-proguanil. I am also having him start on metFORMIN. Additionally, I am having him maintain his finasteride, zolpidem, lisinopril, Ozempic (2 MG/DOSE), tadalafil, and atorvastatin.  Meds ordered this encounter  Medications   atorvastatin (LIPITOR) 10 MG tablet    Sig: Take 1 tablet (10 mg total) by mouth 3 (three) times a week.    Dispense:  90 tablet    Refill:  1   metFORMIN (GLUCOPHAGE-XR) 500 MG 24 hr tablet    Sig: Take 1 tablet (500 mg total) by mouth daily with breakfast.    Dispense:  90 tablet    Refill:  1     Follow-up: Return in about 6 months (around 06/12/2022).  Scarlette Calico, MD

## 2021-12-12 ENCOUNTER — Other Ambulatory Visit (HOSPITAL_COMMUNITY): Payer: Self-pay

## 2021-12-12 ENCOUNTER — Other Ambulatory Visit: Payer: Self-pay | Admitting: Internal Medicine

## 2021-12-12 DIAGNOSIS — E119 Type 2 diabetes mellitus without complications: Secondary | ICD-10-CM

## 2021-12-12 MED ORDER — METFORMIN HCL ER 500 MG PO TB24
500.0000 mg | ORAL_TABLET | Freq: Every day | ORAL | 1 refills | Status: DC
  Filled 2021-12-12 – 2021-12-17 (×2): qty 90, 90d supply, fill #0
  Filled 2021-12-24 – 2022-03-15 (×3): qty 90, 90d supply, fill #1

## 2021-12-17 ENCOUNTER — Other Ambulatory Visit (HOSPITAL_COMMUNITY): Payer: Self-pay

## 2021-12-18 ENCOUNTER — Other Ambulatory Visit (HOSPITAL_COMMUNITY): Payer: Self-pay

## 2021-12-19 ENCOUNTER — Ambulatory Visit: Payer: PPO | Attending: Internal Medicine

## 2021-12-19 ENCOUNTER — Other Ambulatory Visit (HOSPITAL_COMMUNITY): Payer: Self-pay

## 2021-12-19 DIAGNOSIS — R002 Palpitations: Secondary | ICD-10-CM

## 2021-12-20 ENCOUNTER — Other Ambulatory Visit (HOSPITAL_COMMUNITY): Payer: Self-pay

## 2021-12-25 ENCOUNTER — Other Ambulatory Visit (HOSPITAL_COMMUNITY): Payer: Self-pay

## 2021-12-27 ENCOUNTER — Encounter (HOSPITAL_COMMUNITY): Payer: Self-pay

## 2021-12-27 ENCOUNTER — Other Ambulatory Visit (HOSPITAL_COMMUNITY): Payer: Self-pay

## 2022-01-07 ENCOUNTER — Other Ambulatory Visit (HOSPITAL_COMMUNITY): Payer: Self-pay

## 2022-01-13 ENCOUNTER — Encounter: Payer: Self-pay | Admitting: Orthopedic Surgery

## 2022-01-13 ENCOUNTER — Ambulatory Visit (INDEPENDENT_AMBULATORY_CARE_PROVIDER_SITE_OTHER): Payer: PPO

## 2022-01-13 ENCOUNTER — Ambulatory Visit (INDEPENDENT_AMBULATORY_CARE_PROVIDER_SITE_OTHER): Payer: PPO | Admitting: Orthopedic Surgery

## 2022-01-13 DIAGNOSIS — M25561 Pain in right knee: Secondary | ICD-10-CM | POA: Diagnosis not present

## 2022-01-13 NOTE — Progress Notes (Signed)
Office Visit Note   Patient: Adam Ware, Dr.           Date of Birth: September 12, 1950           MRN: [id number] Visit Date: 01/13/2022              Requested by: Janith Lima, MD [address] [place],  [place] [address] PCP: Janith Lima, MD  Chief Complaint  Patient presents with   Right Knee - Pain      HPI: Patient is a 71 year old gentleman who is status post a total knee arthroplasty in 19 and underwent revision of the polyethylene approximately 6 years ago.  Patient this week and had acute onset of mechanical locking of the right knee.  Assessment & Plan: Visit Diagnoses:  1. Acute pain of right knee     Plan: Will obtain a CT scan to evaluate for possible polyethylene breakage.  Anticipate we will need to proceed with revision of the total knee with replacement of the tibial polyliner.  Follow-Up Instructions: No follow-ups on file.   Ortho Exam  Patient is alert, oriented, no adenopathy, well-dressed, normal affect, normal respiratory effort. Examination patient has a mechanical clunking with range of motion of the right knee.  There is a mild effusion.  The patella tracks midline.  There is no pain associated with the mechanical symptoms it appears to be consistent with breakage of the post from the polyliner.  Imaging: XR Knee 1-2 Views Right  Result Date: 01/13/2022 2 view radiographs of the right knee shows a stable total knee arthroplasty.  There is no lucency around the hardware.  There is no varus or valgus malalignment.  The patella is midline.  No images are attached to the encounter.  Labs: Lab Results  Component Value Date   HGBA1C 6.6 12/09/2021   HGBA1C 6.9 05/21/2021   HGBA1C 6.4 (H) 09/24/2020   GRAMSTAIN No WBC Seen 10/21/2012   GRAMSTAIN No Squamous Epithelial Cells Seen 10/21/2012   GRAMSTAIN No Organisms Seen 10/21/2012   LABORGA Multiple Organisms Present,None Predominant 10/21/2012     Lab Results  Component Value Date    ALBUMIN 4.5 09/24/2020   ALBUMIN 4.3 11/10/2018   ALBUMIN 3.9 04/20/2017    No results found for: "MG" No results found for: "VD25OH"  No results found for: "PREALBUMIN"    Latest Ref Rng & Units 05/21/2021   12:00 AM 09/24/2020   10:56 AM 11/10/2018   12:00 AM  CBC EXTENDED  WBC  7.1     8.8  6.4      RBC 4.14 - 5.80 x10E6/uL  5.04    Hemoglobin 13.5 - 17.5 14.4     14.4  14.1      HCT 41 - 53 42     44.3  44      Platelets 150 - 400 K/uL 250     241  272      NEUT# 1.4 - 7.0 x10E3/uL  4.3    Lymph# 0.7 - 3.1 x10E3/uL  3.7       This result is from an external source.     There is no height or weight on file to calculate BMI.  Orders:  Orders Placed This Encounter  Procedures   XR Knee 1-2 Views Right   CT KNEE RIGHT WO CONTRAST   No orders of the defined types were placed in this encounter.    Procedures: No procedures performed  Clinical Data: No  additional findings.  ROS:  All other systems negative, except as noted in the HPI. Review of Systems  Objective: Vital Signs: There were no vitals taken for this visit.  Specialty Comments:  No specialty comments available.  PMFS History: Patient Active Problem List   Diagnosis Date Noted   Intermittent palpitations 12/11/2021   Encounter for general adult medical examination with abnormal findings 06/12/2021   Carpal tunnel syndrome, left upper limb 11/28/2020   Carpal tunnel syndrome, right upper limb 11/28/2020   Erectile dysfunction due to arterial insufficiency 09/24/2020   Benign prostatic hyperplasia with weak urinary stream 05/05/2018   Hyperlipidemia LDL goal <100 05/05/2018   Pure hyperglyceridemia 05/05/2018   Psychophysiological insomnia 05/05/2018   Lumbar radiculopathy 07/15/2017   Hyperlipidemia associated with type 2 diabetes mellitus ([place]) 06/10/2017   Essential hypertension 06/10/2017   Type 2 diabetes mellitus without complication, without long-term current use of insulin ([place])  06/10/2017   Spondylolisthesis, lumbar region 04/21/2017   Past Medical History:  Diagnosis Date   Arthritis    Diabetes mellitus without complication ([place])    Hair loss    History of kidney stones    Hyperlipidemia     Family History  Problem Relation Age of Onset   Cancer Mother    Diabetes Mother    Mental illness Mother    Heart disease Father    Arthritis Brother     Past Surgical History:  Procedure Laterality Date   APPENDECTOMY     JOINT REPLACEMENT     LITHOTRIPSY     REPLACEMENT TOTAL KNEE Right    TOTAL KNEE REVISION Right 04/22/2017   Procedure: REVISION RIGHT TOTAL KNEE ARTHROPLASTY VS. POLY EXCHANGE;  Surgeon: Newt Minion, MD;  Location: [facility];  Service: Orthopedics;  Laterality: Right;   Social History   Occupational History   Not on file  Tobacco Use   Smoking status: Never    Passive exposure: Never   Smokeless tobacco: Never  Vaping Use   Vaping Use: Never used  Substance and Sexual Activity   Alcohol use: Yes   Drug use: No   Sexual activity: Not Currently    Partners: Male

## 2022-01-19 ENCOUNTER — Encounter: Payer: Self-pay | Admitting: Internal Medicine

## 2022-01-19 ENCOUNTER — Encounter: Payer: Self-pay | Admitting: Orthopedic Surgery

## 2022-01-20 ENCOUNTER — Other Ambulatory Visit (HOSPITAL_COMMUNITY): Payer: Self-pay

## 2022-01-21 ENCOUNTER — Other Ambulatory Visit (HOSPITAL_COMMUNITY): Payer: Self-pay

## 2022-01-21 ENCOUNTER — Encounter (HOSPITAL_COMMUNITY): Payer: Self-pay

## 2022-01-22 ENCOUNTER — Ambulatory Visit
Admission: RE | Admit: 2022-01-22 | Discharge: 2022-01-22 | Disposition: A | Discharge status: Home / Self Care | Payer: PPO | Source: Ambulatory Visit | Attending: Orthopedic Surgery | Admitting: Orthopedic Surgery

## 2022-01-22 ENCOUNTER — Other Ambulatory Visit (HOSPITAL_COMMUNITY): Payer: Self-pay

## 2022-01-22 DIAGNOSIS — M25561 Pain in right knee: Secondary | ICD-10-CM

## 2022-01-23 ENCOUNTER — Encounter (HOSPITAL_COMMUNITY): Payer: Self-pay | Admitting: Orthopedic Surgery

## 2022-01-23 NOTE — Progress Notes (Signed)
Cardiologist - n/a PCP/Internal Med- Dr Scarlette Calico  Chest x-ray - n/a EKG - 11/10/21 Stress Test - n/a ECHO - n/a Cardiac Cath - n/a  ICD Pacemaker/Loop - n/a  Sleep Study -  n/a CPAP - none  Do not take Metformin or Ozempic on the morning of surgery.  Patient does not check his blood sugar.  ERAS: Clear liquids til 6:15 am on DOS.  STOP now taking any Aspirin (unless otherwise instructed by your surgeon), Aleve, Naproxen, Ibuprofen, Motrin, Advil, Goody's, BC's, all herbal medications, fish oil, and all vitamins.   Coronavirus Screening Do you have any of the following symptoms:  Cough yes/no: No Fever (>100.38F)  yes/no: No Runny nose yes/no: No Sore throat yes/no: No Difficulty breathing/shortness of breath  yes/no: No  Have you traveled in the last 14 days and where? yes/no: No  Patient verbalized understanding of instructions that were given via phone.

## 2022-01-24 ENCOUNTER — Encounter (HOSPITAL_COMMUNITY)
Admission: RE | Disposition: A | Discharge status: Home / Self Care | Payer: Self-pay | Source: Home / Self Care | Attending: Orthopedic Surgery

## 2022-01-24 ENCOUNTER — Other Ambulatory Visit (HOSPITAL_COMMUNITY): Payer: Self-pay

## 2022-01-24 ENCOUNTER — Other Ambulatory Visit: Payer: Self-pay

## 2022-01-24 ENCOUNTER — Inpatient Hospital Stay (HOSPITAL_COMMUNITY): Payer: PPO | Admitting: Anesthesiology

## 2022-01-24 ENCOUNTER — Encounter (HOSPITAL_COMMUNITY): Payer: Self-pay | Admitting: Orthopedic Surgery

## 2022-01-24 ENCOUNTER — Inpatient Hospital Stay (HOSPITAL_COMMUNITY)
Admission: RE | Admit: 2022-01-24 | Discharge: 2022-01-24 | DRG: 489 | Disposition: A | Discharge status: Home / Self Care | Payer: PPO | Attending: Orthopedic Surgery | Admitting: Orthopedic Surgery

## 2022-01-24 DIAGNOSIS — Z882 Allergy status to sulfonamides status: Secondary | ICD-10-CM | POA: Diagnosis not present

## 2022-01-24 DIAGNOSIS — I10 Essential (primary) hypertension: Secondary | ICD-10-CM | POA: Diagnosis present

## 2022-01-24 DIAGNOSIS — E785 Hyperlipidemia, unspecified: Secondary | ICD-10-CM | POA: Diagnosis present

## 2022-01-24 DIAGNOSIS — Y792 Prosthetic and other implants, materials and accessory orthopedic devices associated with adverse incidents: Secondary | ICD-10-CM | POA: Diagnosis present

## 2022-01-24 DIAGNOSIS — T84012A Broken internal right knee prosthesis, initial encounter: Secondary | ICD-10-CM | POA: Diagnosis not present

## 2022-01-24 DIAGNOSIS — Z96659 Presence of unspecified artificial knee joint: Secondary | ICD-10-CM | POA: Diagnosis present

## 2022-01-24 DIAGNOSIS — Z7985 Long-term (current) use of injectable non-insulin antidiabetic drugs: Secondary | ICD-10-CM

## 2022-01-24 DIAGNOSIS — Z87442 Personal history of urinary calculi: Secondary | ICD-10-CM

## 2022-01-24 DIAGNOSIS — Z7984 Long term (current) use of oral hypoglycemic drugs: Secondary | ICD-10-CM

## 2022-01-24 DIAGNOSIS — G4700 Insomnia, unspecified: Secondary | ICD-10-CM | POA: Diagnosis present

## 2022-01-24 DIAGNOSIS — Z96651 Presence of right artificial knee joint: Secondary | ICD-10-CM | POA: Diagnosis not present

## 2022-01-24 DIAGNOSIS — T84092A Other mechanical complication of internal right knee prosthesis, initial encounter: Principal | ICD-10-CM | POA: Diagnosis present

## 2022-01-24 DIAGNOSIS — E119 Type 2 diabetes mellitus without complications: Secondary | ICD-10-CM | POA: Diagnosis present

## 2022-01-24 DIAGNOSIS — Z9049 Acquired absence of other specified parts of digestive tract: Secondary | ICD-10-CM | POA: Diagnosis not present

## 2022-01-24 DIAGNOSIS — T84018A Broken internal joint prosthesis, other site, initial encounter: Secondary | ICD-10-CM

## 2022-01-24 HISTORY — DX: Insomnia, unspecified: G47.00

## 2022-01-24 HISTORY — DX: Essential (primary) hypertension: I10

## 2022-01-24 HISTORY — PX: I & D KNEE WITH POLY EXCHANGE: SHX5024

## 2022-01-24 LAB — GLUCOSE, CAPILLARY
Glucose-Capillary: 145 mg/dL — ABNORMAL HIGH (ref 70–99)
Glucose-Capillary: 146 mg/dL — ABNORMAL HIGH (ref 70–99)

## 2022-01-24 LAB — CBC
HCT: 41.3 % (ref 39.0–52.0)
Hemoglobin: 14 g/dL (ref 13.0–17.0)
MCH: 29.4 pg (ref 26.0–34.0)
MCHC: 33.9 g/dL (ref 30.0–36.0)
MCV: 86.8 fL (ref 80.0–100.0)
Platelets: 206 10*3/uL (ref 150–400)
RBC: 4.76 MIL/uL (ref 4.22–5.81)
RDW: 13.2 % (ref 11.5–15.5)
WBC: 6.5 10*3/uL (ref 4.0–10.5)
nRBC: 0 % (ref 0.0–0.2)

## 2022-01-24 LAB — BASIC METABOLIC PANEL
Anion gap: 9 (ref 5–15)
BUN: 11 mg/dL (ref 8–23)
CO2: 26 mmol/L (ref 22–32)
Calcium: 8.8 mg/dL — ABNORMAL LOW (ref 8.9–10.3)
Chloride: 104 mmol/L (ref 98–111)
Creatinine, Ser: 0.83 mg/dL (ref 0.61–1.24)
GFR, Estimated: >60 mL/min (ref >60)
Glucose, Bld: 151 mg/dL — ABNORMAL HIGH (ref 70–99)
Potassium: 4 mmol/L (ref 3.5–5.1)
Sodium: 139 mmol/L (ref 135–145)

## 2022-01-24 SURGERY — IRRIGATION AND DEBRIDEMENT KNEE WITH POLY EXCHANGE
Anesthesia: Spinal | Site: Knee | Laterality: Right

## 2022-01-24 MED ORDER — MIDAZOLAM HCL 2 MG/2ML IJ SOLN
INTRAMUSCULAR | Status: AC
  Administered 2022-01-24: 1 mg via INTRAVENOUS
  Filled 2022-01-24: qty 2

## 2022-01-24 MED ORDER — PROPOFOL 10 MG/ML IV BOLUS
INTRAVENOUS | Status: AC
  Filled 2022-01-24: qty 20

## 2022-01-24 MED ORDER — FENTANYL CITRATE (PF) 100 MCG/2ML IJ SOLN
INTRAMUSCULAR | Status: AC
  Administered 2022-01-24: 50 ug via INTRAVENOUS
  Filled 2022-01-24: qty 2

## 2022-01-24 MED ORDER — LACTATED RINGERS IV SOLN: INTRAVENOUS | Status: DC

## 2022-01-24 MED ORDER — ONDANSETRON HCL 4 MG/2ML IJ SOLN
INTRAMUSCULAR | Status: DC | PRN
  Administered 2022-01-24: 4 mg via INTRAVENOUS

## 2022-01-24 MED ORDER — MIDAZOLAM HCL 2 MG/2ML IJ SOLN
INTRAMUSCULAR | Status: AC
  Filled 2022-01-24: qty 2

## 2022-01-24 MED ORDER — OXYCODONE-ACETAMINOPHEN 5-325 MG PO TABS
1.0000 | ORAL_TABLET | ORAL | 0 refills | Status: DC | PRN
  Filled 2022-01-24: qty 10, 2d supply, fill #0

## 2022-01-24 MED ORDER — SODIUM CHLORIDE 0.9 % IR SOLN
Status: DC | PRN
  Administered 2022-01-24: 3000 mL

## 2022-01-24 MED ORDER — METHOCARBAMOL 500 MG PO TABS
500.0000 mg | ORAL_TABLET | Freq: Four times a day (QID) | ORAL | 0 refills | Status: DC | PRN
  Filled 2022-01-24: qty 30, 8d supply, fill #0

## 2022-01-24 MED ORDER — CEFAZOLIN SODIUM-DEXTROSE 2-4 GM/100ML-% IV SOLN
INTRAVENOUS | Status: AC
  Filled 2022-01-24: qty 100

## 2022-01-24 MED ORDER — BUPIVACAINE-EPINEPHRINE (PF) 0.5% -1:200000 IJ SOLN
INTRAMUSCULAR | Status: DC | PRN
  Administered 2022-01-24: 20 mL via PERINEURAL

## 2022-01-24 MED ORDER — DEXAMETHASONE SODIUM PHOSPHATE 10 MG/ML IJ SOLN
INTRAMUSCULAR | Status: DC | PRN
  Administered 2022-01-24: 5 mg via INTRAVENOUS

## 2022-01-24 MED ORDER — ORAL CARE MOUTH RINSE: 15.0000 mL | Freq: Once | OROMUCOSAL | Status: AC

## 2022-01-24 MED ORDER — 0.9 % SODIUM CHLORIDE (POUR BTL) OPTIME
TOPICAL | Status: DC | PRN
  Administered 2022-01-24: 1000 mL

## 2022-01-24 MED ORDER — CHLORHEXIDINE GLUCONATE 0.12 % MT SOLN
OROMUCOSAL | Status: AC
  Administered 2022-01-24: 15 mL via OROMUCOSAL
  Filled 2022-01-24: qty 15

## 2022-01-24 MED ORDER — MIDAZOLAM HCL 2 MG/2ML IJ SOLN: 1.0000 mg | Freq: Once | INTRAMUSCULAR | Status: AC

## 2022-01-24 MED ORDER — INSULIN ASPART 100 UNIT/ML IJ SOLN: 0.0000 [IU] | INTRAMUSCULAR | Status: DC | PRN

## 2022-01-24 MED ORDER — PROPOFOL 500 MG/50ML IV EMUL
INTRAVENOUS | Status: DC | PRN
  Administered 2022-01-24: 100 ug/kg/min via INTRAVENOUS

## 2022-01-24 MED ORDER — MIDAZOLAM HCL 2 MG/2ML IJ SOLN
INTRAMUSCULAR | Status: DC | PRN
  Administered 2022-01-24 (×2): 1 mg via INTRAVENOUS

## 2022-01-24 MED ORDER — PHENYLEPHRINE HCL (PRESSORS) 10 MG/ML IV SOLN
INTRAVENOUS | Status: DC | PRN
  Administered 2022-01-24 (×3): 80 ug via INTRAVENOUS

## 2022-01-24 MED ORDER — PROPOFOL 1000 MG/100ML IV EMUL
INTRAVENOUS | Status: AC
  Filled 2022-01-24: qty 100

## 2022-01-24 MED ORDER — HYDROCODONE-ACETAMINOPHEN 5-325 MG PO TABS
1.0000 | ORAL_TABLET | ORAL | 0 refills | Status: DC | PRN
  Filled 2022-01-24: qty 30, 5d supply, fill #0

## 2022-01-24 MED ORDER — FENTANYL CITRATE (PF) 100 MCG/2ML IJ SOLN: 25.0000 ug | INTRAMUSCULAR | Status: DC | PRN

## 2022-01-24 MED ORDER — CEFAZOLIN SODIUM-DEXTROSE 2-4 GM/100ML-% IV SOLN
2.0000 g | INTRAVENOUS | Status: AC
  Administered 2022-01-24: 2 g via INTRAVENOUS

## 2022-01-24 MED ORDER — FENTANYL CITRATE (PF) 100 MCG/2ML IJ SOLN: 50.0000 ug | Freq: Once | INTRAMUSCULAR | Status: AC

## 2022-01-24 MED ORDER — CHLORHEXIDINE GLUCONATE 0.12 % MT SOLN: 15.0000 mL | Freq: Once | OROMUCOSAL | Status: AC

## 2022-01-24 MED ORDER — ACETAMINOPHEN 500 MG PO TABS
1000.0000 mg | ORAL_TABLET | Freq: Once | ORAL | Status: AC
  Administered 2022-01-24: 1000 mg via ORAL
  Filled 2022-01-24: qty 2

## 2022-01-24 MED ORDER — BUPIVACAINE IN DEXTROSE 0.75-8.25 % IT SOLN
INTRATHECAL | Status: DC | PRN
  Administered 2022-01-24: 1.6 mL via INTRATHECAL

## 2022-01-24 SURGICAL SUPPLY — 50 items
BAG COUNTER SPONGE SURGICOUNT (BAG) IMPLANT
BAG SPNG CNTER NS LX DISP (BAG)
BLADE SURG 21 STRL SS (BLADE) ×2 IMPLANT
BNDG CMPR 5X4 CHSV STRCH STRL (GAUZE/BANDAGES/DRESSINGS) ×1
BNDG COHESIVE 4X5 TAN STRL LF (GAUZE/BANDAGES/DRESSINGS) IMPLANT
BNDG COHESIVE 6X5 TAN STRL LF (GAUZE/BANDAGES/DRESSINGS) IMPLANT
BNDG GAUZE DERMACEA FLUFF 4 (GAUZE/BANDAGES/DRESSINGS) ×4 IMPLANT
BNDG GZE DERMACEA 4 6PLY (GAUZE/BANDAGES/DRESSINGS) ×2
CNTNR URN SCR LID CUP LEK RST (MISCELLANEOUS) IMPLANT
CONT SPEC 4OZ STRL OR WHT (MISCELLANEOUS) ×1
COVER MAYO STAND STRL (DRAPES) IMPLANT
COVER SURGICAL LIGHT HANDLE (MISCELLANEOUS) ×4 IMPLANT
CUFF TOURN SGL QUICK 34 (TOURNIQUET CUFF) ×1
CUFF TRNQT CYL 34X4X40X1 (TOURNIQUET CUFF) IMPLANT
DRAPE INCISE IOBAN 66X45 STRL (DRAPES) IMPLANT
DRAPE U-SHAPE 47X51 STRL (DRAPES) ×2 IMPLANT
DRSG ADAPTIC 3X8 NADH LF (GAUZE/BANDAGES/DRESSINGS) ×2 IMPLANT
DRSG AQUACEL AG ADV 3.5X10 (GAUZE/BANDAGES/DRESSINGS) IMPLANT
DURAPREP 26ML APPLICATOR (WOUND CARE) ×2 IMPLANT
ELECT REM PT RETURN 9FT ADLT (ELECTROSURGICAL)
ELECTRODE REM PT RTRN 9FT ADLT (ELECTROSURGICAL) IMPLANT
GAUZE SPONGE 4X4 12PLY STRL (GAUZE/BANDAGES/DRESSINGS) ×2 IMPLANT
GLOVE BIOGEL PI IND STRL 9 (GLOVE) ×2 IMPLANT
GLOVE SURG ORTHO 9.0 STRL STRW (GLOVE) ×2 IMPLANT
GOWN STRL REUS W/ TWL XL LVL3 (GOWN DISPOSABLE) ×4 IMPLANT
GOWN STRL REUS W/TWL XL LVL3 (GOWN DISPOSABLE) ×2
HANDPIECE INTERPULSE COAX TIP (DISPOSABLE) ×1
INSERT TIBIAL KNEE SZ9 10MM (Insert) IMPLANT
KIT BASIN OR (CUSTOM PROCEDURE TRAY) ×2 IMPLANT
KIT TURNOVER KIT B (KITS) ×2 IMPLANT
MANIFOLD NEPTUNE II (INSTRUMENTS) ×2 IMPLANT
NS IRRIG 1000ML POUR BTL (IV SOLUTION) ×2 IMPLANT
PACK ORTHO EXTREMITY (CUSTOM PROCEDURE TRAY) ×2 IMPLANT
PAD ARMBOARD 7.5X6 YLW CONV (MISCELLANEOUS) ×4 IMPLANT
SET HNDPC FAN SPRY TIP SCT (DISPOSABLE) IMPLANT
SPONGE T-LAP 18X18 ~~LOC~~+RFID (SPONGE) IMPLANT
STAPLER VISISTAT 35W (STAPLE) IMPLANT
STOCKINETTE IMPERVIOUS 9X36 MD (GAUZE/BANDAGES/DRESSINGS) IMPLANT
STOCKINETTE IMPERVIOUS LG (DRAPES) IMPLANT
SUT ETHILON 2 0 PSLX (SUTURE) ×2 IMPLANT
SUT VIC AB 0 CT1 27 (SUTURE) ×1
SUT VIC AB 0 CT1 27XBRD ANBCTR (SUTURE) IMPLANT
SUT VIC AB 0 CTX 36 (SUTURE) ×1
SUT VIC AB 0 CTX36XBRD ANBCTRL (SUTURE) IMPLANT
SUT VIC AB 1 CTX 27 (SUTURE) IMPLANT
SWAB COLLECTION DEVICE MRSA (MISCELLANEOUS) ×2 IMPLANT
SWAB CULTURE ESWAB REG 1ML (MISCELLANEOUS) IMPLANT
TOWEL GREEN STERILE (TOWEL DISPOSABLE) ×2 IMPLANT
TUBE CONNECTING 12X1/4 (SUCTIONS) ×2 IMPLANT
YANKAUER SUCT BULB TIP NO VENT (SUCTIONS) ×2 IMPLANT

## 2022-01-24 NOTE — H&P (Signed)
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   Chief Complaint: Mechanical locking right total knee arthroplasty. HPI: Patient is a 71 year old gentleman who is status post a total knee arthroplasty in 18 and underwent revision of the polyethylene approximately 6 years ago. Patient this week and had acute onset of mechanical locking of the right knee.   Past Medical History:  Diagnosis Date   Arthritis    Diabetes mellitus without complication ([place])    type 2; tx metformin and wkly ozempic on Sundays   Hair loss    History of kidney stones    surgery to remove   Hyperlipidemia    tx w/atorvastatin   Hypertension    tx w/lisinopril   Insomnia    tx w/ambien    Past Surgical History:  Procedure Laterality Date   APPENDECTOMY     COLONOSCOPY     LITHOTRIPSY     REPLACEMENT TOTAL KNEE Right    TOTAL KNEE REVISION Right 04/22/2017   Procedure: REVISION RIGHT TOTAL KNEE ARTHROPLASTY VS. POLY EXCHANGE;  Surgeon: Newt Minion, MD;  Location: [facility];  Service: Orthopedics;  Laterality: Right;    Family History  Problem Relation Age of Onset   Cancer Mother    Diabetes Mother    Mental illness Mother    Heart disease Father    Arthritis Brother    Social History:  reports that he has never smoked. He has never been exposed to tobacco smoke. He has never used smokeless tobacco. He reports current alcohol use of about 7.0 standard drinks of alcohol per week. He reports that he does not use drugs.  Allergies:  Allergies  Allergen Reactions   Sulfa Antibiotics Other (See Comments)    Fixed drug reaction    No medications prior to admission.    No results found for this or any previous visit (from the past 48 hour(s)). CT KNEE RIGHT WO CONTRAST  Result Date: 01/23/2022 CLINICAL DATA:  History of right knee surgery. Popping sensation. Evaluate hardware. Surgery scheduled for 01/24/2022. Evaluate for loose body. EXAM: CT OF THE RIGHT KNEE WITHOUT CONTRAST TECHNIQUE: Multidetector CT  imaging of the right knee was performed according to the standard protocol. Multiplanar CT image reconstructions were also generated. RADIATION DOSE REDUCTION: This exam was performed according to the departmental dose-optimization program which includes automated exposure control, adjustment of the mA and/or kV according to patient size and/or use of iterative reconstruction technique. COMPARISON:  Right knee radiographs 01/13/2022 and 05/06/2017 FINDINGS: Bones/Joint/Cartilage Status post total right knee arthroplasty. Within the limitations of diffuse streak artifact, no perihardware lucency is seen to indicate hardware failure or loosening. There are mild scattered mineralized densities seen along the course of the far medial aspect of the distal quadriceps tendon (axial series 3 images 23 through 30, sagittal series 10 images 31 through 35). This likely reflects chronic enthesopathic change and/or chronic postsurgical heterotopic bone formation. Ligaments Suboptimally assessed by CT. Muscles and Tendons Normal density and size of the regional musculature. No gross tendon tear is seen. Soft tissues Moderate joint effusion with mildly thickened synovium, possible mild synovitis. No loose body is seen. Mild-to-moderate atherosclerotic calcifications. IMPRESSION: 1. Status post total right knee arthroplasty without evidence of hardware failure or loosening. 2. Moderate joint effusion with mildly thickened synovium, possible mild synovitis. 3. No loose body is seen. Electronically Signed   By: Yvonne Kendall M.D.   On: 01/23/2022 09:50    Review of Systems  All other systems reviewed  and are negative.   Height '6\' 1"'$  (1.854 m), weight 91.6 kg. Physical Exam  Examination of the right knee patient has no redness no cellulitis no effusion no signs of infection.  The varus and valgus stress is stable no laxity of the collateral ligaments.  With range of motion patient has a mechanical clunk and shift of the knee  with range of motion.  There is mechanical failure within the polyethylene.  The patella tracks midline no evidence of subluxation of the patella.  Review of the CT scan shows no loosening of the femoral or tibial components or patella component.  Mechanical instability is coming from the polyethylene tray most likely a failure of the post. 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  Plan: Will plan for revision of the polyethylene tray.  Patient's knee has good stability varus and valgus stress anticipate the tibial tray will be 10 mm in thickness.  Newt Minion, MD 01/24/2022, 6:49 AM

## 2022-01-24 NOTE — Progress Notes (Signed)
Physical Therapy Treatment Patient Details Name: Adam Ware, Dr. MRN: [id number] DOB: [birth year] Today's Date: 01/24/2022   History of Present Illness Pt is 71 yo male admitted 01/24/22 with failed polyliner R TKA and is s/p I and D with poly exchange on 01/24/22.  Pt with hx of arthritis, DM, HLD, HTN, R TKA 1996, R TKA revision 2019    PT Comments    Pt with decreased spinal effect and was able to demonstrate safe transfers, ambulation with crutches, and stairs with crutches.  Pt with good pain control, quad activation, and ROM.  Pt with good understanding of safety, exercises, and rehab process.  Motivated to return home today and will have support as needed.     Recommendations for follow up therapy are one component of a multi-disciplinary discharge planning process, led by the attending physician.  Recommendations may be updated based on patient status, additional functional criteria and insurance authorization.  Follow Up Recommendations  Follow physician's recommendations for discharge plan and follow up therapies     Assistance Recommended at Discharge PRN  Patient can return home with the following Assistance with cooking/housework;Help with stairs or ramp for entrance   Equipment Recommendations  Crutches    Recommendations for Other Services       Precautions / Restrictions Precautions Precautions: Fall Restrictions Other Position/Activity Restrictions: R LE FWB per orders     Mobility  Bed Mobility Overal bed mobility: Needs Assistance Bed Mobility: Supine to Sit, Sit to Supine     Supine to sit: Modified independent (Device/Increase time) Sit to supine: Modified independent (Device/Increase time)        Transfers Overall transfer level: Needs assistance Equipment used: Crutches Transfers: Sit to/from Stand Sit to Stand: Modified independent (Device/Increase time), Min guard           General transfer comment: Started min guard progressed to mod  I level; performed x 2 during session    Ambulation/Gait Ambulation/Gait assistance: Supervision Gait Distance (Feet): 200 Feet Assistive device: Crutches Gait Pattern/deviations: Step-through pattern Gait velocity: normal     General Gait Details: Steady gait with crutches; crutch height adjusted; no major gait deviations other than lacking terminal knee extension   Stairs Stairs: Yes Stairs assistance: Min guard Stair Management: With crutches Number of Stairs: 3 General stair comments: cues for sequencing initially; performed safely   Wheelchair Mobility    Modified Rankin (Stroke Patients Only)       Balance Overall balance assessment: Needs assistance Sitting-balance support: No upper extremity supported Sitting balance-Leahy Scale: Normal     Standing balance support: Bilateral upper extremity supported, No upper extremity supported Standing balance-Leahy Scale: Good Standing balance comment: crutches to ambulate; but able to static stand and adjust crutches without assist                            Cognition Arousal/Alertness: Awake/alert Behavior During Therapy: WFL for tasks assessed/performed Overall Cognitive Status: Within Functional Limits for tasks assessed                                          Exercises      General Comments General comments (skin integrity, edema, etc.): Pt with good gluteal activation and improved sensation.  R knee ROM ~8 to 60 degrees limited by dressing/pain-stretching feeling.  Pt educated on LAQ, knee flex, quad  sets as pain allows - pt familiar with exercises      Pertinent Vitals/Pain Pain Assessment Pain Assessment: 0-10 Pain Score: 1  Pain Location: R knee Pain Descriptors / Indicators: Pressure Pain Intervention(s): Limited activity within patient's tolerance, Monitored during session    Home Living Family/patient expects to be discharged to:: Private residence Living  Arrangements: Alone Available Help at Discharge: Friend(s);Available PRN/intermittently Type of Home: House Home Access: Stairs to enter Entrance Stairs-Rails: None Entrance Stairs-Number of Steps: 2-3   Home Layout: One level Home Equipment: Cane - single point Additional Comments: has crutches in room    Prior Function            PT Goals (current goals can now be found in the care plan section) Acute Rehab PT Goals Patient Stated Goal: return home PT Goal Formulation: With patient Time For Goal Achievement: 02/07/22 Potential to Achieve Goals: Good Progress towards PT goals: Progressing toward goals    Frequency    7X/week      PT Plan Current plan remains appropriate    Co-evaluation              AM-PAC PT "6 Clicks" Mobility   Outcome Measure  Help needed turning from your back to your side while in a flat bed without using bedrails?: None Help needed moving from lying on your back to sitting on the side of a flat bed without using bedrails?: None Help needed moving to and from a bed to a chair (including a wheelchair)?: None Help needed standing up from a chair using your arms (e.g., wheelchair or bedside chair)?: None Help needed to walk in hospital room?: A Little Help needed climbing 3-5 steps with a railing? : A Little 6 Click Score: 22    End of Session Equipment Utilized During Treatment: Gait belt Activity Tolerance: Patient tolerated treatment well Patient left: in bed;with call bell/phone within reach Nurse Communication: Mobility status PT Visit Diagnosis: Other abnormalities of gait and mobility (R26.89);Muscle weakness (generalized) (M62.81)     Time: 1717-1730 PT Time Calculation (min) (ACUTE ONLY): 13 min  Charges:  $Gait Training: 8-22 mins                     [name], PT Acute Rehab [facility] [place] 01/24/2022, 5:37 PM

## 2022-01-24 NOTE — Anesthesia Procedure Notes (Signed)
Anesthesia Regional Block: Adductor canal block   Pre-Anesthetic Checklist: , timeout performed,  Correct Patient, Correct Site, Correct Laterality,  Correct Procedure, Correct Position, site marked,  Risks and benefits discussed,  Pre-op evaluation,  At surgeon's request and post-op pain management  Laterality: Right  Prep: Maximum Sterile Barrier Precautions used, chloraprep       Needles:  Injection technique: Single-shot  Needle Type: Echogenic Stimulator Needle     Needle Length: 9cm  Needle Gauge: 21     Additional Needles:   Procedures:,,,, ultrasound used (permanent image in chart),,    Narrative:  Start time: 01/24/2022 8:10 AM End time: 01/24/2022 8:20 AM Injection made incrementally with aspirations every 5 mL.  Performed by: Personally  Anesthesiologist: Roderic Palau, MD  Additional Notes:

## 2022-01-24 NOTE — Interval H&P Note (Signed)
History and Physical Interval Note:  01/24/2022 8:28 AM  Adam Ware, Dr.  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  The various methods of treatment have been discussed with the patient and family. After consideration of risks, benefits and other options for treatment, the patient has consented to  Procedure(s): IRRIGATION AND DEBRIDEMENT RIGHT KNEE WITH POLY EXCHANGE (Right) as a surgical intervention.  The patient's history has been reviewed, patient examined, no change in status, stable for surgery.  I have reviewed the patient's chart and labs.  Questions were answered to the patient's satisfaction.     Adam Ware

## 2022-01-24 NOTE — Op Note (Signed)
01/24/2022  5:42 PM  PATIENT:  Adam Ware, Dr.    Santa Lighter DIAGNOSIS:  Failed Poly Liner Right Total Knee Arthroplasty  POST-OPERATIVE DIAGNOSIS:  Same, the post had broken off the polyliner.  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    The risks benefits and alternatives were discussed with the patient preoperatively including but not limited to the risks of infection, bleeding, nerve injury, cardiopulmonary complications, the need for revision surgery, among others, and the patient was willing to proceed.  OPERATIVE IMPLANTS: 10 mm [employer] polyethylene liner.  '@ENCIMAGES'$ @  OPERATIVE FINDINGS: The post had broken off the polyethylene liner.  There is no wear medially or laterally on the tibial tray  OPERATIVE PROCEDURE: Patient brought the operating room underwent a regional plus spinal anesthetic.  After adequate levels anesthesia were obtained patient's right lower extremity was prepped using DuraPrep draped into a sterile field a timeout was called.  Previous midline incision was made carried down to and medial parapatellar retinacular incision.  There was minimal effusion no signs of infection no synovitis.  The tibial tray was removed the tissue margins were cleared and a new tray was inserted.  The tray plus the broken post were removed.  No sign of wear on the tibial tray medially or laterally.  The post had sheared off.  The knee was placed through range of motion after replacement of the 10 mm [employer] tibial tray.  There was good extension flexion and varus and valgus stress was stable.  The retinaculum was closed using #1 Vicryl the skin and subcu  was closed using 0 Vicryl and the skin was closed using staples.  A dry dressing was applied.  Patient was taken the PACU in stable condition.   DISCHARGE PLANNING:  Antibiotic duration: Preoperative antibiotics  Weightbearing: Weightbearing as tolerated  Pain medication: Prescription for pain medicine  Dressing care/ Wound VAC: Dry dressing  Ambulatory devices: Crutches  Discharge to: Home.  Follow-up: In the office 1 week post operative.

## 2022-01-24 NOTE — Anesthesia Postprocedure Evaluation (Signed)
Anesthesia Post Note  Patient: Adam Ware, Dr.  Jule Ser) Performed: IRRIGATION AND DEBRIDEMENT RIGHT KNEE WITH POLY EXCHANGE (Right: Knee)     Patient location during evaluation: PACU Anesthesia Type: Spinal and Regional Level of consciousness: oriented and awake and alert Pain management: pain level controlled Vital Signs Assessment: post-procedure vital signs reviewed and stable Respiratory status: spontaneous breathing and respiratory function stable Cardiovascular status: blood pressure returned to baseline and stable Postop Assessment: no headache, no backache, no apparent nausea or vomiting, spinal receding and patient able to bend at knees Anesthetic complications: no  No notable events documented.  Last Vitals:  Vitals:   01/24/22 1330 01/24/22 1415  BP: 133/85 131/78  Pulse: 72 80  Resp: 14 15  Temp:    SpO2: 97% 99%    Last Pain:  Vitals:   01/24/22 1415  PainSc: 0-No pain                 Oronde Hallenbeck,W. EDMOND

## 2022-01-24 NOTE — Transfer of Care (Signed)
Immediate Anesthesia Transfer of Care Note  Patient: Adam Ware, Dr.  Jule Ser) Performed: IRRIGATION AND DEBRIDEMENT RIGHT KNEE WITH POLY EXCHANGE (Right: Knee)  Patient Location: PACU  Anesthesia Type:MAC, Regional, and Spinal  Level of Consciousness: drowsy  Airway & Oxygen Therapy: Patient Spontanous Breathing and Patient connected to face mask oxygen  Post-op Assessment: Report given to RN and Post -op Vital signs reviewed and stable  Post vital signs: Reviewed and stable  Last Vitals:  Vitals Value Taken Time  BP 108/70 01/24/22 1220  Temp    Pulse 75 01/24/22 1220  Resp 13 01/24/22 1220  SpO2 99 % 01/24/22 1220  Vitals shown include unvalidated device data.  Last Pain:  Vitals:   01/24/22 0745  PainSc: 0-No pain         Complications: No notable events documented.

## 2022-01-24 NOTE — Progress Notes (Signed)
PT Cancellation Note  Patient Details Name: Adam Ware, Dr. MRN: [id number] DOB: 08-Feb-1951   Cancelled Treatment:    Reason Eval/Treat Not Completed: Other (comment)  Spoke with RN 1330 and spinal still effective.  She reports she can notify therapy when pt ready. [name], PT Acute Rehab [facility] [phone]  Karlton Lemon 01/24/2022, 2:27 PM

## 2022-01-24 NOTE — Progress Notes (Signed)
Orthopedic Tech Progress Note Patient Details:  DAAIEL STARLIN, Dr. 1950/04/04 [id number]  Ortho Devices Type of Ortho Device: Crutches Ortho Device/Splint Interventions: Ordered, Adjustment   Post Interventions Patient Tolerated: Well Instructions Provided: Adjustment of device, Care of device, Poper ambulation with device  [facility] 01/24/2022, 2:52 PM

## 2022-01-24 NOTE — Evaluation (Signed)
Physical Therapy Evaluation Patient Details Name: Adam Ware, Dr. MRN: [id number] DOB: 09/02/1950 Today's Date: 01/24/2022  History of Present Illness  Pt is 71 yo male admitted 01/24/22 with failed polyliner R TKA and is s/p I and D with poly exchange on 01/24/22.  Pt with hx of arthritis, DM, HLD, HTN, R TKA 1996, R TKA revision 2019  Clinical Impression  Pt admitted with above diagnosis and PT asked to see pt in PACU for possible d/c.  Pt able to SLR bilaterally and initially reporting good sensation in buttock and good gluteal activation.  However, upon sitting pt reports gluteal sensation feels numb and then he attempted to stand (before therapist could get belt or RW) but required mod A and holding onto therapist to stabilize.  He then returned to sitting.  Pt also incontinent of urine upon standing.  Pt still with effects of spinal and unsafe to progress, will f/u later today as able.       Recommendations for follow up therapy are one component of a multi-disciplinary discharge planning process, led by the attending physician.  Recommendations may be updated based on patient status, additional functional criteria and insurance authorization.  Follow Up Recommendations Follow physician's recommendations for discharge plan and follow up therapies      Assistance Recommended at Discharge PRN (expected to progress as spinal clears)  Patient can return home with the following  A little help with walking and/or transfers;A little help with bathing/dressing/bathroom;Assistance with cooking/housework;Help with stairs or ramp for entrance    Equipment Recommendations Rolling walker (2 wheels);Crutches (RW vs crutches - will follow up)  Recommendations for Other Services       Functional Status Assessment Patient has had a recent decline in their functional status and demonstrates the ability to make significant improvements in function in a reasonable and predictable amount of time.      Precautions / Restrictions Precautions Precautions: Fall Restrictions Other Position/Activity Restrictions: R LE FWB per orders      Mobility  Bed Mobility Overal bed mobility: Needs Assistance Bed Mobility: Supine to Sit, Sit to Supine     Supine to sit: Supervision Sit to supine: Supervision        Transfers Overall transfer level: Needs assistance Equipment used: 1 person hand held assist Transfers: Sit to/from Stand Sit to Stand: Mod assist           General transfer comment: Pt transitioning immediately to standing once at EOB and was unsteady requiring holding onto therapist and mod A to steady.  Reports gluteal muscles aren't working well yet so returned to supine.  Pt also incontinent of urine and when returned to supine reports he is still numb in groin area    Ambulation/Gait               General Gait Details: unable  Stairs            Wheelchair Mobility    Modified Rankin (Stroke Patients Only)       Balance Overall balance assessment: Needs assistance Sitting-balance support: No upper extremity supported Sitting balance-Leahy Scale: Good     Standing balance support: Bilateral upper extremity supported Standing balance-Leahy Scale: Poor                               Pertinent Vitals/Pain Pain Assessment Pain Assessment: No/denies pain    Home Living Family/patient expects to be discharged to:: Private residence Living  Arrangements: Alone Available Help at Discharge: Friend(s);Available PRN/intermittently Type of Home: House Home Access: Stairs to enter Entrance Stairs-Rails: None Entrance Stairs-Number of Steps: 2-3   Home Layout: One level Home Equipment: Cane - single point Additional Comments: has crutches in room    Prior Function Prior Level of Function : Independent/Modified Independent;Working/employed;Driving             Mobility Comments: Pt MD in primary care       Hand Dominance         Extremity/Trunk Assessment   Upper Extremity Assessment Upper Extremity Assessment: Overall WFL for tasks assessed    Lower Extremity Assessment Lower Extremity Assessment: LLE deficits/detail;RLE deficits/detail RLE Deficits / Details: Able to SLR; initially able to contract glutes but once sitting EOB and standing felt weak and weak gluteal activation; returned to bed LLE Deficits / Details: Able to SLR; initially able to contract glutes but once sitting EOB and standing felt weak and weak gluteal activation; returned to bed    Cervical / Trunk Assessment Cervical / Trunk Assessment: Normal  Communication   Communication: No difficulties  Cognition Arousal/Alertness: Awake/alert Behavior During Therapy: WFL for tasks assessed/performed Overall Cognitive Status: Within Functional Limits for tasks assessed                                          General Comments      Exercises     Assessment/Plan    PT Assessment Patient needs continued PT services  PT Problem List Decreased strength;Pain;Decreased range of motion;Impaired sensation;Decreased activity tolerance;Decreased balance;Decreased safety awareness;Decreased mobility;Decreased knowledge of precautions;Decreased knowledge of use of DME       PT Treatment Interventions DME instruction;Therapeutic exercise;Gait training;Balance training;Stair training;Functional mobility training;Therapeutic activities;Patient/family education;Modalities    PT Goals (Current goals can be found in the Care Plan section)  Acute Rehab PT Goals Patient Stated Goal: return home PT Goal Formulation: With patient Time For Goal Achievement: 02/07/22 Potential to Achieve Goals: Good    Frequency 7X/week     Co-evaluation               AM-PAC PT "6 Clicks" Mobility  Outcome Measure Help needed turning from your back to your side while in a flat bed without using bedrails?: A Little Help needed moving from  lying on your back to sitting on the side of a flat bed without using bedrails?: A Little Help needed moving to and from a bed to a chair (including a wheelchair)?: A Lot Help needed standing up from a chair using your arms (e.g., wheelchair or bedside chair)?: A Lot Help needed to walk in hospital room?: A Lot Help needed climbing 3-5 steps with a railing? : Total 6 Click Score: 13    End of Session Equipment Utilized During Treatment: Gait belt Activity Tolerance: Other (comment) (limited due to spinal) Patient left: in bed;with call bell/phone within reach Nurse Communication: Mobility status PT Visit Diagnosis: Other abnormalities of gait and mobility (R26.89);Muscle weakness (generalized) (M62.81)    Time: 5852-7782 PT Time Calculation (min) (ACUTE ONLY): 18 min   Charges:   PT Evaluation $PT Eval Low Complexity: 1 Low          Kayci Belleville, PT Acute Rehab [facility] [phone]   Karlton Lemon 01/24/2022, 4:17 PM

## 2022-01-24 NOTE — Anesthesia Preprocedure Evaluation (Addendum)
Anesthesia Evaluation  Patient identified by MRN, date of birth, ID band Patient awake    Reviewed: Allergy & Precautions, H&P , NPO status , Patient's Chart, lab work & pertinent test results  Airway Mallampati: II  TM Distance: >3 FB Neck ROM: Full    Dental no notable dental hx. (+) Teeth Intact, Dental Advisory Given   Pulmonary neg pulmonary ROS   Pulmonary exam normal breath sounds clear to auscultation       Cardiovascular hypertension, Pt. on medications  Rhythm:Regular Rate:Normal     Neuro/Psych negative neurological ROS  negative psych ROS   GI/Hepatic negative GI ROS, Neg liver ROS,,,  Endo/Other  diabetes, Type 2, Oral Hypoglycemic Agents    Renal/GU negative Renal ROS  negative genitourinary   Musculoskeletal  (+) Arthritis , Osteoarthritis,    Abdominal   Peds  Hematology negative hematology ROS (+)   Anesthesia Other Findings   Reproductive/Obstetrics negative OB ROS                             Anesthesia Physical Anesthesia Plan  ASA: 2  Anesthesia Plan: Spinal   Post-op Pain Management: Regional block* and Tylenol PO (pre-op)*   Induction: Intravenous  PONV Risk Score and Plan: 2 and Propofol infusion, Midazolam and Ondansetron  Airway Management Planned: Natural Airway and Simple Face Mask  Additional Equipment:   Intra-op Plan:   Post-operative Plan:   Informed Consent: I have reviewed the patients History and Physical, chart, labs and discussed the procedure including the risks, benefits and alternatives for the proposed anesthesia with the patient or authorized representative who has indicated his/her understanding and acceptance.     Dental advisory given  Plan Discussed with: CRNA  Anesthesia Plan Comments:        Anesthesia Quick Evaluation

## 2022-01-24 NOTE — Anesthesia Procedure Notes (Signed)
Spinal  Patient location during procedure: OR Start time: 01/24/2022 10:42 AM End time: 01/24/2022 10:47 AM Reason for block: surgical anesthesia Staffing Performed: anesthesiologist  Anesthesiologist: Roderic Palau, MD Performed by: Roderic Palau, MD Authorized by: Roderic Palau, MD   Preanesthetic Checklist Completed: patient identified, IV checked, risks and benefits discussed, surgical consent, monitors and equipment checked, pre-op evaluation and timeout performed Spinal Block Patient position: sitting Prep: DuraPrep Patient monitoring: cardiac monitor, continuous pulse ox and blood pressure Approach: midline Location: L3-4 Injection technique: single-shot Needle Needle type: Pencan  Needle gauge: 24 G Needle length: 9 cm Assessment Sensory level: T8 Events: CSF return Additional Notes Functioning IV was confirmed and monitors were applied. Sterile prep and drape, including hand hygiene and sterile gloves were used. The patient was positioned and the spine was prepped. The skin was anesthetized with lidocaine.  Free flow of clear CSF was obtained prior to injecting local anesthetic into the CSF.  The spinal needle aspirated freely following injection.  The needle was carefully withdrawn.  The patient tolerated the procedure well.

## 2022-01-24 NOTE — Anesthesia Procedure Notes (Signed)
Procedure Name: MAC Date/Time: 01/24/2022 10:50 AM  Performed by: Elvin So, CRNAPre-anesthesia Checklist: Patient identified, Emergency Drugs available, Suction available and Patient being monitored Patient Re-evaluated:Patient Re-evaluated prior to induction Oxygen Delivery Method: Simple face mask

## 2022-01-25 ENCOUNTER — Encounter: Payer: Self-pay | Admitting: Orthopedic Surgery

## 2022-01-26 NOTE — Discharge Summary (Signed)
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  Patient was brought to the operating room on 01/24/2022 and underwent Procedure(s): IRRIGATION AND DEBRIDEMENT RIGHT KNEE WITH POLY EXCHANGE.    Patient was given perioperative antibiotics:  Anti-infectives (From admission, onward)    Start     Dose/Rate Route Frequency Ordered Stop   01/24/22 0745  ceFAZolin (ANCEF) IVPB 2g/100 mL premix        2 g 200 mL/hr over 30 Minutes Intravenous On call to O.R. 01/24/22 0733 01/24/22 1110   01/24/22 0734  ceFAZolin (ANCEF) 2-4 GM/100ML-% IVPB       Note to Pharmacy: Odelia Gage E: cabinet override      01/24/22 0734 01/24/22 1158     .  Patient was given sequential compression devices, early ambulation, and aspirin for DVT prophylaxis.  Recent vital signs: No data found..  Recent laboratory studies: No results found.  Discharge Medications:   Allergies as of 01/24/2022       Reactions   Sulfa Antibiotics Other (See Comments)   Fixed drug reaction        Medication List     TAKE these medications    atorvastatin 10 MG tablet Commonly known as: LIPITOR Take 1 tablet (10 mg total) by mouth 3 (three) times a week. What changed: when to take this   finasteride 5 MG tablet Commonly known as: PROSCAR Take 1 tablet (5 mg total) by mouth daily. What changed: how much to take   HYDROcodone-acetaminophen 5-325 MG tablet Commonly known as: NORCO/VICODIN Take 1 tablet by mouth every 4 (four) hours as  needed.   ibuprofen 800 MG tablet Commonly known as: ADVIL Take 800 mg by mouth every 8 (eight) hours as needed for moderate pain.   lisinopril 10 MG tablet Commonly known as: ZESTRIL Take 1 tablet by mouth daily.   metFORMIN 500 MG 24 hr tablet Commonly known as: GLUCOPHAGE-XR Take 1 tablet (500 mg total) by mouth daily with breakfast.   methocarbamol 500 MG tablet Commonly known as: ROBAXIN Take 1 tablet (500 mg total) by mouth every 6 (six) hours as needed for muscle spasms.   oxyCODONE-acetaminophen 5-325 MG tablet Commonly known as: PERCOCET/ROXICET Take 1 tablet by mouth every 4 (four) hours as needed.   Ozempic (2 MG/DOSE) 8 MG/3ML Sopn Generic drug: Semaglutide (2 MG/DOSE) Inject 2 mg as directed once a week.   tadalafil 20 MG tablet Commonly known as: CIALIS Take 1 tablet (20 mg total) by mouth daily as needed for erectile dysfunction.   zolpidem 6.25 MG CR tablet Commonly known as: AMBIEN CR Take 1 tablet (6.25 mg total) by mouth at bedtime as needed for sleep.        Diagnostic Studies: CT KNEE RIGHT WO CONTRAST  Result Date: 01/23/2022 CLINICAL DATA:  History of right knee surgery. Popping sensation. Evaluate hardware. Surgery scheduled for 01/24/2022. Evaluate for loose body. EXAM: CT OF THE RIGHT KNEE WITHOUT CONTRAST TECHNIQUE: Multidetector CT imaging of the right knee was performed according to the standard protocol. Multiplanar CT image reconstructions were  also generated. RADIATION DOSE REDUCTION: This exam was performed according to the departmental dose-optimization program which includes automated exposure control, adjustment of the mA and/or kV according to patient size and/or use of iterative reconstruction technique. COMPARISON:  Right knee radiographs 01/13/2022 and 05/06/2017 FINDINGS: Bones/Joint/Cartilage Status post total right knee arthroplasty. Within the limitations of diffuse streak artifact, no perihardware lucency is seen to indicate  hardware failure or loosening. There are mild scattered mineralized densities seen along the course of the far medial aspect of the distal quadriceps tendon (axial series 3 images 23 through 30, sagittal series 10 images 31 through 35). This likely reflects chronic enthesopathic change and/or chronic postsurgical heterotopic bone formation. Ligaments Suboptimally assessed by CT. Muscles and Tendons Normal density and size of the regional musculature. No gross tendon tear is seen. Soft tissues Moderate joint effusion with mildly thickened synovium, possible mild synovitis. No loose body is seen. Mild-to-moderate atherosclerotic calcifications. IMPRESSION: 1. Status post total right knee arthroplasty without evidence of hardware failure or loosening. 2. Moderate joint effusion with mildly thickened synovium, possible mild synovitis. 3. No loose body is seen. Electronically Signed   By: Yvonne Kendall M.D.   On: 01/23/2022 09:50   CARDIAC EVENT MONITOR  Result Date: 01/18/2022   Normal sinus rhyttm   Blocked PAC's   Possible 2 degree AVB, transient vs blocked PAC's   One instance sinus pause 3.2 seconds   XR Knee 1-2 Views Right  Result Date: 01/13/2022 2 view radiographs of the right knee shows a stable total knee arthroplasty.  There is no lucency around the hardware.  There is no varus or valgus malalignment.  The patella is midline.   Patient benefited maximally from their hospital stay and there were no complications.     Disposition: Discharge disposition: 01-Home or Self Care      Discharge Instructions     Call MD / Call 911   Complete by: As directed    If you experience chest pain or shortness of breath, CALL 911 and be transported to the hospital emergency room.  If you develope a fever above 101 F, pus (white drainage) or increased drainage or redness at the wound, or calf pain, call your surgeon's office.   Call MD / Call 911   Complete by: As directed    If you experience chest  pain or shortness of breath, CALL 911 and be transported to the hospital emergency room.  If you develope a fever above 101 F, pus (white drainage) or increased drainage or redness at the wound, or calf pain, call your surgeon's office.   Constipation Prevention   Complete by: As directed    Drink plenty of fluids.  Prune juice may be helpful.  You may use a stool softener, such as Colace (over the counter) 100 mg twice a day.  Use MiraLax (over the counter) for constipation as needed.   Constipation Prevention   Complete by: As directed    Drink plenty of fluids.  Prune juice may be helpful.  You may use a stool softener, such as Colace (over the counter) 100 mg twice a day.  Use MiraLax (over the counter) for constipation as needed.   Diet - low sodium heart healthy   Complete by: As directed    Diet - low sodium heart healthy   Complete by: As directed    Increase activity slowly as tolerated   Complete by: As directed    Increase activity slowly as tolerated  Complete by: As directed    Post-operative opioid taper instructions:   Complete by: As directed    POST-OPERATIVE OPIOID TAPER INSTRUCTIONS: It is important to wean off of your opioid medication as soon as possible. If you do not need pain medication after your surgery it is ok to stop day one. Opioids include: Codeine, Hydrocodone(Norco, Vicodin), Oxycodone(Percocet, oxycontin) and hydromorphone amongst others.  Long term and even short term use of opiods can cause: Increased pain response Dependence Constipation Depression Respiratory depression And more.  Withdrawal symptoms can include Flu like symptoms Nausea, vomiting And more Techniques to manage these symptoms Hydrate well Eat regular healthy meals Stay active Use relaxation techniques(deep breathing, meditating, yoga) Do Not substitute Alcohol to help with tapering If you have been on opioids for less than two weeks and do not have pain than it is ok to stop  all together.  Plan to wean off of opioids This plan should start within one week post op of your joint replacement. Maintain the same interval or time between taking each dose and first decrease the dose.  Cut the total daily intake of opioids by one tablet each day Next start to increase the time between doses. The last dose that should be eliminated is the evening dose.      Post-operative opioid taper instructions:   Complete by: As directed    POST-OPERATIVE OPIOID TAPER INSTRUCTIONS: It is important to wean off of your opioid medication as soon as possible. If you do not need pain medication after your surgery it is ok to stop day one. Opioids include: Codeine, Hydrocodone(Norco, Vicodin), Oxycodone(Percocet, oxycontin) and hydromorphone amongst others.  Long term and even short term use of opiods can cause: Increased pain response Dependence Constipation Depression Respiratory depression And more.  Withdrawal symptoms can include Flu like symptoms Nausea, vomiting And more Techniques to manage these symptoms Hydrate well Eat regular healthy meals Stay active Use relaxation techniques(deep breathing, meditating, yoga) Do Not substitute Alcohol to help with tapering If you have been on opioids for less than two weeks and do not have pain than it is ok to stop all together.  Plan to wean off of opioids This plan should start within one week post op of your joint replacement. Maintain the same interval or time between taking each dose and first decrease the dose.  Cut the total daily intake of opioids by one tablet each day Next start to increase the time between doses. The last dose that should be eliminated is the evening dose.          Follow-up Information     Newt Minion, MD Follow up in 1 week(s).   Specialty: Orthopedic Surgery Contact information: [address] [place] [address] [phone]                  Signed: Newt Minion 01/26/2022, 9:27 AM

## 2022-01-27 ENCOUNTER — Encounter (HOSPITAL_COMMUNITY): Payer: Self-pay | Admitting: Orthopedic Surgery

## 2022-01-28 ENCOUNTER — Telehealth: Payer: Self-pay | Admitting: Orthopedic Surgery

## 2022-01-28 NOTE — Telephone Encounter (Signed)
Patient called in for first post op appt he had IRRIGATION AND DEBRIDEMENT RIGHT KNEE WITH POLY EXCHANGE  on 12/08 he is requesting something later in the afternoon possibly a Friday or Monday, please advise

## 2022-01-28 NOTE — Telephone Encounter (Signed)
I can open an appt on Monday at 4. Dr. Sharol Given is not here on Fridays or he can come in this Thursday at 4. I sent him a mychart message to ask if he wanted to do this week. Its whichever works best. Thursday this week or Monday next week I will open a time at 4 for either day. Thanks

## 2022-01-28 NOTE — Telephone Encounter (Signed)
I made the appt. Thanks!

## 2022-02-03 ENCOUNTER — Encounter: Payer: Self-pay | Admitting: Orthopedic Surgery

## 2022-02-03 ENCOUNTER — Ambulatory Visit (INDEPENDENT_AMBULATORY_CARE_PROVIDER_SITE_OTHER): Payer: PPO | Admitting: Orthopedic Surgery

## 2022-02-03 DIAGNOSIS — Z96651 Presence of right artificial knee joint: Secondary | ICD-10-CM

## 2022-02-03 NOTE — Progress Notes (Signed)
Office Visit Note   Patient: Adam Ware, Dr.           Date of Birth: [birth year]           MRN: [id number] Visit Date: 02/03/2022              Requested by: Janith Lima, MD [address] [place],   [id number] PCP: Janith Lima, MD  Chief Complaint  Patient presents with   Right Knee - Routine Post Op    01/24/22 I&D w/ poly exhange right knee      HPI: Patient is a 71 year old gentleman who is 10 days status post exchange of the polyethylene liner right total knee.  The post had broken off.  Assessment & Plan: Visit Diagnoses:  1. Total knee replacement status, right     Plan: Patient was given a thigh-high compression stocking.  Recommended aspirin daily.  Will set up physical therapy.  Follow-Up Instructions: No follow-ups on file.   Ortho Exam  Patient is alert, oriented, no adenopathy, well-dressed, normal affect, normal respiratory effort. Patient does have some ecchymosis and bruising and swelling with dependent bruising.  He has no pain with active range of motion of the ankle with his knee extended.  There is a mild effusion.  The wound edges are well-approximated.  The majority of the staples are removed and Steri-Strips applied.  Imaging: No results found. No images are attached to the encounter.  Labs: Lab Results  Component Value Date   HGBA1C 6.6 12/09/2021   HGBA1C 6.9 05/21/2021   HGBA1C 6.4 (H) 09/24/2020   GRAMSTAIN No WBC Seen 10/21/2012   GRAMSTAIN No Squamous Epithelial Cells Seen 10/21/2012   GRAMSTAIN No Organisms Seen 10/21/2012   LABORGA Multiple Organisms Present,None Predominant 10/21/2012     Lab Results  Component Value Date   ALBUMIN 4.5 09/24/2020   ALBUMIN 4.3 11/10/2018   ALBUMIN 3.9 04/20/2017    No results found for: "MG" No results found for: "VD25OH"  No results found for: "PREALBUMIN"    Latest Ref Rng & Units 01/24/2022    8:11 AM 05/21/2021   12:00 AM 09/24/2020   10:56 AM  CBC EXTENDED  WBC 4.0  - 10.5 K/uL 6.5  7.1     8.8   RBC 4.22 - 5.81 MIL/uL 4.76   5.04   Hemoglobin 13.0 - 17.0 g/dL 14.0  14.4     14.4   HCT 39.0 - 52.0 % 41.3  42     44.3   Platelets 150 - 400 K/uL 206  250     241   NEUT# 1.4 - 7.0 x10E3/uL   4.3   Lymph# 0.7 - 3.1 x10E3/uL   3.7      This result is from an external source.     There is no height or weight on file to calculate BMI.  Orders:  No orders of the defined types were placed in this encounter.  No orders of the defined types were placed in this encounter.    Procedures: No procedures performed  Clinical Data: No additional findings.  ROS:  All other systems negative, except as noted in the HPI. Review of Systems  Objective: Vital Signs: There were no vitals taken for this visit.  Specialty Comments:  No specialty comments available.  PMFS History: Patient Active Problem List   Diagnosis Date Noted   Failed total knee arthroplasty, initial encounter ([facility]) 01/24/2022   Total knee replacement status, right 01/24/2022  Intermittent palpitations 12/11/2021   Encounter for general adult medical examination with abnormal findings 06/12/2021   Carpal tunnel syndrome, left upper limb 11/28/2020   Carpal tunnel syndrome, right upper limb 11/28/2020   Erectile dysfunction due to arterial insufficiency 09/24/2020   Benign prostatic hyperplasia with weak urinary stream 05/05/2018   Hyperlipidemia LDL goal <100 05/05/2018   Pure hyperglyceridemia 05/05/2018   Psychophysiological insomnia 05/05/2018   Lumbar radiculopathy 07/15/2017   Hyperlipidemia associated with type 2 diabetes mellitus ([place]) 06/10/2017   Essential hypertension 06/10/2017   Type 2 diabetes mellitus without complication, without long-term current use of insulin ([place] [facility]) 06/10/2017   Spondylolisthesis, lumbar region 04/21/2017   Past Medical History:  Diagnosis Date   Arthritis    Diabetes mellitus without complication ([facility])    type 2; tx metformin and wkly  ozempic on Sundays   Hair loss    History of kidney stones    surgery to remove   Hyperlipidemia    tx w/atorvastatin   Hypertension    tx w/lisinopril   Insomnia    tx w/ambien    Family History  Problem Relation Age of Onset   Cancer Mother    Diabetes Mother    Mental illness Mother    Heart disease Father    Arthritis Brother     Past Surgical History:  Procedure Laterality Date   APPENDECTOMY     COLONOSCOPY     I & D KNEE WITH POLY EXCHANGE Right 01/24/2022   Procedure: IRRIGATION AND DEBRIDEMENT RIGHT KNEE WITH POLY EXCHANGE;  Surgeon: Newt Minion, MD;  Location: [facility];  Service: Orthopedics;  Laterality: Right;   LITHOTRIPSY     REPLACEMENT TOTAL KNEE Right    TOTAL KNEE REVISION Right 04/22/2017   Procedure: REVISION RIGHT TOTAL KNEE ARTHROPLASTY VS. POLY EXCHANGE;  Surgeon: Newt Minion, MD;  Location: [facility];  Service: Orthopedics;  Laterality: Right;   Social History   Occupational History   Not on file  Tobacco Use   Smoking status: Never    Passive exposure: Never   Smokeless tobacco: Never  Vaping Use   Vaping Use: Never used  Substance and Sexual Activity   Alcohol use: Yes    Alcohol/week: 7.0 standard drinks of alcohol    Types: 7 Glasses of wine per week    Comment: wine   Drug use: No   Sexual activity: Not Currently    Partners: Male

## 2022-02-06 ENCOUNTER — Ambulatory Visit (INDEPENDENT_AMBULATORY_CARE_PROVIDER_SITE_OTHER): Payer: PPO | Admitting: Physical Therapy

## 2022-02-06 ENCOUNTER — Other Ambulatory Visit: Payer: Self-pay

## 2022-02-06 ENCOUNTER — Encounter: Payer: Self-pay | Admitting: Physical Therapy

## 2022-02-06 DIAGNOSIS — R600 Localized edema: Secondary | ICD-10-CM | POA: Diagnosis not present

## 2022-02-06 DIAGNOSIS — M6281 Muscle weakness (generalized): Secondary | ICD-10-CM

## 2022-02-06 DIAGNOSIS — M25661 Stiffness of right knee, not elsewhere classified: Secondary | ICD-10-CM | POA: Diagnosis not present

## 2022-02-06 DIAGNOSIS — M25561 Pain in right knee: Secondary | ICD-10-CM

## 2022-02-06 NOTE — Therapy (Signed)
OUTPATIENT PHYSICAL THERAPY LOWER EXTREMITY EVALUATION   Patient Name: Adam Ware, Dr. MRN: [id number] DOB:[birth year], 71 y.o., male Today's Date: 02/06/2022  END OF SESSION:  PT End of Session - 02/06/22 1420     Visit Number 1    Number of Visits 6    Date for PT Re-Evaluation 03/20/22    Authorization Type [employer]    Progress Note Due on Visit 10    PT Start Time 1340    PT Stop Time 1415    PT Time Calculation (min) 35 min    Activity Tolerance Patient tolerated treatment well    Behavior During Therapy WFL for tasks assessed/performed             Past Medical History:  Diagnosis Date   Arthritis    Diabetes mellitus without complication ([place])    type 2; tx metformin and wkly ozempic on Sundays   Hair loss    History of kidney stones    surgery to remove   Hyperlipidemia    tx w/atorvastatin   Hypertension    tx w/lisinopril   Insomnia    tx w/ambien   Past Surgical History:  Procedure Laterality Date   APPENDECTOMY     COLONOSCOPY     I & D KNEE WITH POLY EXCHANGE Right 01/24/2022   Procedure: IRRIGATION AND DEBRIDEMENT RIGHT KNEE WITH POLY EXCHANGE;  Surgeon: Newt Minion, MD;  Location: [facility];  Service: Orthopedics;  Laterality: Right;   LITHOTRIPSY     REPLACEMENT TOTAL KNEE Right    TOTAL KNEE REVISION Right 04/22/2017   Procedure: REVISION RIGHT TOTAL KNEE ARTHROPLASTY VS. POLY EXCHANGE;  Surgeon: Newt Minion, MD;  Location: [facility];  Service: Orthopedics;  Laterality: Right;   Patient Active Problem List   Diagnosis Date Noted   Failed total knee arthroplasty, initial encounter ([place]) 01/24/2022   Total knee replacement status, right 01/24/2022   Intermittent palpitations 12/11/2021   Encounter for general adult medical examination with abnormal findings 06/12/2021   Carpal tunnel syndrome, left upper limb 11/28/2020   Carpal tunnel syndrome, right upper limb 11/28/2020   Erectile dysfunction due to arterial insufficiency  09/24/2020   Benign prostatic hyperplasia with weak urinary stream 05/05/2018   Hyperlipidemia LDL goal <100 05/05/2018   Pure hyperglyceridemia 05/05/2018   Psychophysiological insomnia 05/05/2018   Lumbar radiculopathy 07/15/2017   Hyperlipidemia associated with type 2 diabetes mellitus ([place]) 06/10/2017   Essential hypertension 06/10/2017   Type 2 diabetes mellitus without complication, without long-term current use of insulin ([place]) 06/10/2017   Spondylolisthesis, lumbar region 04/21/2017    PCP: Scarlette Calico, MD  REFERRING PROVIDER: Newt Minion, MD  REFERRING DIAG: [phone] (ICD-10-CM) - Total knee replacement status, right  THERAPY DIAG:  Acute pain of right knee - Plan: PT plan of care cert/re-cert  Stiffness of right knee, not elsewhere classified - Plan: PT plan of care cert/re-cert  Localized edema - Plan: PT plan of care cert/re-cert  Muscle weakness (generalized) - Plan: PT plan of care cert/re-cert  Rationale for Evaluation and Treatment: Rehabilitation  ONSET DATE: 01/24/22  SUBJECTIVE:   SUBJECTIVE STATEMENT: Pt is 71 yo male admitted 01/24/22 with failed polyliner R TKA and is s/p I and D with poly exchange on 01/24/22.  He reports minimal pain except at incision impacting motion.   PERTINENT HISTORY: arthritis, DM, HLD, HTN, R TKA 1996, R TKA revision 2019  PAIN:  Are you having pain? Yes: NPRS scale: did not rate/10 Pain  location: Rt knee Pain description: sore Aggravating factors: bending knee Relieving factors: ice/heat, ibuprofen or voltaren  PRECAUTIONS: None  WEIGHT BEARING RESTRICTIONS: No  FALLS:  Has patient fallen in last 6 months? 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  PLOF: Independent and Leisure: walking, golf  PATIENT GOALS: unsure, overall doing well  NEXT MD VISIT:  PRN  OBJECTIVE:   PATIENT SURVEYS:  02/06/22 FOTO 61 (predicted 88)  COGNITION: Overall cognitive status: Within functional limits for tasks assessed     SENSATION: WFL  EDEMA:  02/06/22: increased Rt knee swelling into distal lower extremity; some LLE edema as well but only distal  LOWER EXTREMITY ROM:  Active ROM Right eval Left eval  Knee flexion 90   Knee extension -1 (seated LAQ)    (Blank rows = not tested)  Passive ROM Right eval Left eval  Knee flexion 94   Knee extension 2  (hyper)    (Blank rows = not tested)  LOWER EXTREMITY MMT:  Deferred formal testing today; grossly 3/5 due to post op status  MMT Right eval Left eval  Hip flexion    Hip extension    Hip abduction    Hip adduction    Hip internal rotation    Hip external rotation    Knee flexion    Knee extension    Ankle dorsiflexion    Ankle plantarflexion    Ankle inversion    Ankle eversion     (Blank rows = not tested)  GAIT: 02/06/22:  Distance walked: within clinic Assistive device utilized: None Level of assistance: Complete Independence Comments: mild antalgic gait with decreased Rt TKE   TODAY'S TREATMENT:                                                                                                                              DATE:  02/06/22 See HEP - discussed gym program as well as various ways to increase knee flexion    PATIENT EDUCATION:  Education details: HEP Person educated: Patient Education method: Explanation, Demonstration, and Handouts Education comprehension: verbalized understanding, returned demonstration, and needs further education  HOME EXERCISE PROGRAM: Access Code: [email or web address] URL: [email or web address] Date: 02/06/2022 Prepared by: Faustino Congress  Exercises - Seated Straight Leg Raise  - 2-3 x daily - 7 x weekly - 2 sets - 10 reps - 5 sec hold - Supine Heel Slide with Strap  - 2-3 x daily - 7 x weekly - 2 sets - 10 reps -  Seated Knee Flexion AAROM  - 2-3 x daily - 7 x weekly - 2 sets - 10 reps - 10 sec hold - Seated Knee Flexion AAROM  - 2-3 x daily - 7 x weekly - 2 sets - 10 reps - 5-10 hold - Recumbent Bike  - 1 x daily - 7  x weekly - 3 sets - 10 reps - Single Leg Knee Extension with Weight Machine  - 1 x daily - 7 x weekly - 3 sets - 10 reps - Single Leg Hamstring Curl with Weight Machine  - 1 x daily - 7 x weekly - 3 sets - 10 reps - Single Leg Press  - 1 x daily - 7 x weekly - 3 sets - 10 reps  ASSESSMENT:  CLINICAL IMPRESSION: Patient is a 71 y.o. male who was seen today for physical therapy evaluation and treatment s/p I and D with poly exchange on 01/24/22 due to failed Rt TKA.  He demonstrates decreased strength, ROM and gait abnormalities as well as post op pain and swelling affecting functional mobility.  He will benefit from PT to address deficits listed.   OBJECTIVE IMPAIRMENTS: Abnormal gait, decreased mobility, decreased ROM, decreased strength, increased edema, and pain.   ACTIVITY LIMITATIONS: bending, standing, squatting, stairs, transfers, and locomotion level  PARTICIPATION LIMITATIONS: meal prep, cleaning, laundry, driving, community activity, occupation, and yard work  PERSONAL FACTORS: 3+ comorbidities: arthritis, DM, HLD, HTN, R TKA 1996, R TKA revision 2019  are also affecting patient's functional outcome.   REHAB POTENTIAL: Good  CLINICAL DECISION MAKING: Evolving/moderate complexity  EVALUATION COMPLEXITY: Moderate   GOALS: Goals reviewed with patient? Yes  SHORT TERM GOALS: Target date: 02/27/2022  Independent with initial HEP Goal status: INITIAL  2.  Rt knee flexion improved to 100 deg active for improved function Goal status: INITIAL   LONG TERM GOALS: Target date: 03/20/2022  Independent with final HEP Goal status: INITIAL  2.  FOTO score improved to 66 Goal status: INITIAL  3.  Rt knee AROM improved to 0-110 for improved function and mobility Goal status:  INIITAL  4.  Report pain < 2/10 with flexion and walking activities for improved function Goal status: INITIAL   PLAN:  PT FREQUENCY: 1x/week anticipate 1 visit every 3 weeks, but will see up to 1x/wk if needed  PT DURATION: 6 weeks  PLANNED INTERVENTIONS: Therapeutic exercises, Therapeutic activity, Neuromuscular re-education, Balance training, Gait training, Patient/Family education, Self Care, Joint mobilization, Stair training, Aquatic Therapy, Dry Needling, Electrical stimulation, Cryotherapy, Moist heat, Taping, Vasopneumatic device, Ionotophoresis '4mg'$ /ml Dexamethasone, Manual therapy, and Re-evaluation  PLAN FOR NEXT SESSION: review HEP, measure flexion, progress HEP as needed   Laureen Abrahams, PT, DPT 02/06/22 2:29 PM

## 2022-02-07 ENCOUNTER — Other Ambulatory Visit (HOSPITAL_COMMUNITY): Payer: Self-pay

## 2022-02-12 ENCOUNTER — Other Ambulatory Visit: Payer: Self-pay | Admitting: Physical Medicine and Rehabilitation

## 2022-02-12 DIAGNOSIS — M5416 Radiculopathy, lumbar region: Secondary | ICD-10-CM

## 2022-02-24 ENCOUNTER — Other Ambulatory Visit (HOSPITAL_COMMUNITY): Payer: Self-pay

## 2022-02-24 ENCOUNTER — Ambulatory Visit (INDEPENDENT_AMBULATORY_CARE_PROVIDER_SITE_OTHER): Payer: PPO | Admitting: Physical Medicine and Rehabilitation

## 2022-02-24 ENCOUNTER — Ambulatory Visit: Payer: Self-pay

## 2022-02-24 ENCOUNTER — Other Ambulatory Visit: Payer: Self-pay | Admitting: Internal Medicine

## 2022-02-24 ENCOUNTER — Encounter: Payer: Self-pay | Admitting: Internal Medicine

## 2022-02-24 VITALS — BP 151/71 | HR 80

## 2022-02-24 DIAGNOSIS — T8130XA Disruption of wound, unspecified, initial encounter: Secondary | ICD-10-CM | POA: Diagnosis not present

## 2022-02-24 DIAGNOSIS — Q762 Congenital spondylolisthesis: Secondary | ICD-10-CM

## 2022-02-24 DIAGNOSIS — M5416 Radiculopathy, lumbar region: Secondary | ICD-10-CM

## 2022-02-24 DIAGNOSIS — M4316 Spondylolisthesis, lumbar region: Secondary | ICD-10-CM | POA: Diagnosis not present

## 2022-02-24 DIAGNOSIS — E119 Type 2 diabetes mellitus without complications: Secondary | ICD-10-CM

## 2022-02-24 MED ORDER — TIRZEPATIDE 7.5 MG/0.5ML ~~LOC~~ SOAJ
7.5000 mg | SUBCUTANEOUS | 0 refills | Status: DC
  Filled 2022-02-24: qty 6, 84d supply, fill #0
  Filled 2022-02-27: qty 2, 28d supply, fill #0
  Filled 2022-03-13: qty 6, 84d supply, fill #0

## 2022-02-24 MED ORDER — METHYLPREDNISOLONE ACETATE 80 MG/ML IJ SUSP
80.0000 mg | Freq: Once | INTRAMUSCULAR | Status: AC
  Administered 2022-02-24: 80 mg

## 2022-02-24 NOTE — Procedures (Signed)
Lumbosacral Transforaminal Epidural Steroid Injection - Sub-Pedicular Approach with Fluoroscopic Guidance  Patient: Adam Ware, Dr.      Date of Birth: Dec 10, 1950 MRN: [id number] PCP: Janith Lima, MD      Visit Date: 02/24/2022   Universal Protocol:    Date/Time: 02/24/2022  Consent Given By: the patient  Position: PRONE  Additional Comments: Vital signs were monitored before and after the procedure. Patient was prepped and draped in the usual sterile fashion. The correct patient, procedure, and site was verified.   Injection Procedure Details:   Procedure diagnoses: Lumbar radiculopathy [M54.16]    Meds Administered:  Meds ordered this encounter  Medications   methylPREDNISolone acetate (DEPO-MEDROL) injection 80 mg    Laterality: Right  Location/Site: L5  Needle:5.0 in., 22 ga.  Short bevel or Quincke spinal needle  Needle Placement: Transforaminal  Findings:    -Comments: Excellent flow of contrast along the nerve, nerve root and into the epidural space.  Procedure Details: After squaring off the end-plates to get a true AP view, the C-arm was positioned so that an oblique view of the foramen as noted above was visualized. The target area is just inferior to the "nose of the scotty dog" or sub pedicular. The soft tissues overlying this structure were infiltrated with 2-3 ml. of 1% Lidocaine without Epinephrine.  The spinal needle was inserted toward the target using a "trajectory" view along the fluoroscope beam.  Under AP and lateral visualization, the needle was advanced so it did not puncture dura and was located close the 6 O'Clock position of the pedical in AP tracterory. Biplanar projections were used to confirm position. Aspiration was confirmed to be negative for CSF and/or blood. A 1-2 ml. volume of Isovue-250 was injected and flow of contrast was noted at each level. Radiographs were obtained for documentation purposes.   After attaining the desired  flow of contrast documented above, a 0.5 to 1.0 ml test dose of 0.25% Marcaine was injected into each respective transforaminal space.  The patient was observed for 90 seconds post injection.  After no sensory deficits were reported, and normal lower extremity motor function was noted,   the above injectate was administered so that equal amounts of the injectate were placed at each foramen (level) into the transforaminal epidural space.   Additional Comments:  No complications occurred Dressing: 2 x 2 sterile gauze and Band-Aid    Post-procedure details: Patient was observed during the procedure. Post-procedure instructions were reviewed.  Patient left the clinic in stable condition.

## 2022-02-24 NOTE — Patient Instructions (Signed)

## 2022-02-24 NOTE — Progress Notes (Signed)
Functional Pain Scale - descriptive words and definitions  No Pain (0)   No Pain/Loss of function  Average Pain 0   +Driver, -BT, -Dye Allergies.  Right sided lower back pain with tingling in buttocks

## 2022-02-24 NOTE — Progress Notes (Signed)
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 Meridee Score for reevaluation and management of low back pain with right paresthesias in the L5 dermatome with history of lumbar spondylolisthesis and spondylolysis with biforaminal severe narrowing.  Last MRI was 2019.  Last time I saw the patient was in 2019.  We completed several epidural injections on the right at L5 with decent relief of his symptoms up until the last several months.  He has had increasing paresthesias and not as much pain although he has had 1 episode of pain.  He has felt like his great toe is somewhat weaker than the left but this may be chronic.  He has not had any foot drop or other issues.  He has been dealing with his right knee which had the palmar replacement Dr. Sharol Given.  He reports that postop several of the staples were removed and he remove the rest of these.  He has got 2 areas on the knee that he feels like are not healing well and he wonders if there is a retained staple.  He put himself on an antibiotic for this but has had no swelling of the knee and no real skin changes.     Review of Systems  Musculoskeletal:  Positive for joint pain.  Skin:        Scar healing issue  Neurological:  Positive for tingling.  All other systems reviewed and are negative.  Otherwise per HPI.  Assessment & Plan: Visit Diagnoses:    ICD-10-CM   1. Lumbar radiculopathy  M54.16 XR C-ARM NO REPORT    Epidural Steroid injection    methylPREDNISolone acetate (DEPO-MEDROL) injection 80 mg    2. Wound dehiscence  T81.30XA     3. 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 Spondylolisthesis of lumbar region  M43.16         Plan: Findings:  1.  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  Not so much pain as he has paresthesias which are new over the last several months.  He said this is completely down over the last several years.  No specific injury or trauma.  Did report he felt like his big toe was somewhat weak but this may be chronic.  Exam shows minor bit of weakness.  Will complete right L5 transforaminal injection today since this has failed conservative care with time greater than 8 weeks of symptoms despite use of anti-inflammatories and therapy.  2.  Right knee status post replacement of the polymer by Dr. Sharol Given on 01/24/2022.  Patient was concerned that he may have left a staple in the wound.  We did fluoroscopy imaging today while redoing his back and I did not see any retained staples.  I have asked him to see if Dr. Sharol Given could see him today to look at his wound as he is somewhat concerned.  He is diabetic.    Meds & Orders:  Meds ordered this encounter  Medications   methylPREDNISolone acetate (DEPO-MEDROL) injection 80 mg    Orders Placed This Encounter  Procedures  XR C-ARM NO REPORT   Epidural Steroid injection    Follow-up: No follow-ups on file.   Procedures: No procedures performed  Lumbosacral Transforaminal Epidural Steroid Injection - Sub-Pedicular Approach with Fluoroscopic Guidance  Patient: Adam Ware, Dr.      Date of Birth: 01-15-1951 MRN: [id number] PCP: Janith Lima, MD      Visit Date: 02/24/2022   Universal Protocol:    Date/Time: 02/24/2022  Consent Given By: the patient  Position: PRONE  Additional Comments: Vital signs were monitored before and after the procedure. Patient was prepped and draped in the usual sterile fashion. The correct patient, procedure, and site was verified.   Injection Procedure Details:   Procedure diagnoses: Lumbar radiculopathy [M54.16]    Meds  Administered:  Meds ordered this encounter  Medications   methylPREDNISolone acetate (DEPO-MEDROL) injection 80 mg    Laterality: Right  Location/Site: L5  Needle:5.0 in., 22 ga.  Short bevel or Quincke spinal needle  Needle Placement: Transforaminal  Findings:    -Comments: Excellent flow of contrast along the nerve, nerve root and into the epidural space.  Procedure Details: After squaring off the end-plates to get a true AP view, the C-arm was positioned so that an oblique view of the foramen as noted above was visualized. The target area is just inferior to the "nose of the scotty dog" or sub pedicular. The soft tissues overlying this structure were infiltrated with 2-3 ml. of 1% Lidocaine without Epinephrine.  The spinal needle was inserted toward the target using a "trajectory" view along the fluoroscope beam.  Under AP and lateral visualization, the needle was advanced so it did not puncture dura and was located close the 6 O'Clock position of the pedical in AP tracterory. Biplanar projections were used to confirm position. Aspiration was confirmed to be negative for CSF and/or blood. A 1-2 ml. volume of Isovue-250 was injected and flow of contrast was noted at each level. Radiographs were obtained for documentation purposes.   After attaining the desired flow of contrast documented above, a 0.5 to 1.0 ml test dose of 0.25% Marcaine was injected into each respective transforaminal space.  The patient was observed for 90 seconds post injection.  After no sensory deficits were reported, and normal lower extremity motor function was noted,   the above injectate was administered so that equal amounts of the injectate were placed at each foramen (level) into the transforaminal epidural space.   Additional Comments:  No complications occurred Dressing: 2 x 2 sterile gauze and Band-Aid    Post-procedure details: Patient was observed during the procedure. Post-procedure instructions  were reviewed.  Patient left the clinic in stable condition.    Clinical History: MRI LUMBAR SPINE WITHOUT CONTRAST   TECHNIQUE:  Multiplanar, multisequence MR imaging of the lumbar spine was  performed. No intravenous contrast was administered.   COMPARISON: Lumbar spine radiographs 04/21/2017. CT abdomen and  pelvis 03/25/2014.   FINDINGS:  Segmentation: Standard.   Alignment: 8 mm anterolisthesis of L5 on S1 due to bilateral L5 pars  defects.   Vertebrae: No acute fracture or suspicious osseous lesion. Mild  left-sided L4 inferior endplate edema, likely degenerative with a  small Schmorl's node also present in this location.   Conus medullaris and cauda equina: Conus extends to the L1-2 level.  Conus and cauda equina appear normal.   Paraspinal and other soft tissues: Unremarkable.   Disc levels:   L1-2: Negative.   L2-3: Mild disc desiccation. Minimal disc bulging without  stenosis.   L3-4: Mild disc desiccation. Minimal disc bulging and mild facet  arthrosis without stenosis.   L4-5: Mild disc desiccation. Mild disc bulging and moderate facet  hypertrophy result in mild left neural foraminal stenosis without  spinal stenosis. A 5 x 1 mm cyst is noted along the left ligamentum  flavum, not resulting in neural impingement.   L5-S1: Disc desiccation and severe disc space narrowing.  Anterolisthesis, disc space height loss, and bulging uncovered disc  result in severe bilateral neural foraminal stenosis with L5 nerve  root compression. No spinal stenosis.   IMPRESSION:  1. Bilateral L5 pars defects with grade 1 anterolisthesis, advanced  disc degeneration, and severe bilateral neural foraminal stenosis  with L5 nerve root compression.  2. Mild disc bulging and moderate facet hypertrophy at L4-5  resulting in mild left neural foraminal stenosis.    Electronically Signed  By: Logan Bores M.D.  On: 05/01/2017 17:09   He reports that he has never smoked. He  has never been exposed to tobacco smoke. He has never used smokeless tobacco.  Recent Labs    05/21/21 0000 12/09/21 0000  HGBA1C 6.9 6.6    Objective:  VS:  HT:    WT:   BMI:     BP:(!) 151/71  HR:80bpm  TEMP: ( )  RESP:  Physical Exam Vitals and nursing note reviewed.  Constitutional:      General: He is not in acute distress.    Appearance: Normal appearance. He is well-developed and normal weight.  HENT:     Head: Normocephalic and atraumatic.     Right Ear: External ear normal.     Left Ear: External ear normal.     Nose: Nose normal.  Eyes:     Conjunctiva/sclera: Conjunctivae normal.     Pupils: Pupils are equal, round, and reactive to light.  Cardiovascular:     Rate and Rhythm: Normal rate.     Pulses: Normal pulses.     Heart sounds: Normal heart sounds.  Pulmonary:     Effort: Pulmonary effort is normal. No respiratory distress.  Musculoskeletal:     Cervical back: Normal range of motion and neck supple. No rigidity.     Right lower leg: No edema.     Left lower leg: No edema.     Comments: Patient ambulates without aid.  He has some pain with extension and standing from sit to stand.  No pain with hip rotation.  Has good distal strength with maybe mild weakness of the great toe compared to the left but good strength really 5 out of 5.  Dysesthesia in L5 dermatome on the right.  Skin:    General: Skin is warm and dry.     Findings: Erythema present. No rash.     Comments: Right knee shows surgical scar anteriorly some redness but no drainage no swelling.  There is 2 areas proximal with small opening.  No apparent staples in place.  Neurological:     General: No focal deficit present.     Mental Status: He is alert and oriented to person, place, and time.     Sensory: No sensory deficit.     Coordination: Coordination normal.     Gait: Gait normal.  Psychiatric:        Mood and Affect: Mood normal.        Behavior: Behavior normal.     Ortho  Exam  Imaging: No results found.  Past Medical/Family/Surgical/Social History: Medications & Allergies  reviewed per EMR, new medications updated. Patient Active Problem List   Diagnosis Date Noted   Failed total knee arthroplasty, initial encounter ([place]) 01/24/2022   Total knee replacement status, right 01/24/2022   Intermittent palpitations 12/11/2021   Encounter for general adult medical examination with abnormal findings 06/12/2021   Carpal tunnel syndrome, left upper limb 11/28/2020   Carpal tunnel syndrome, right upper limb 11/28/2020   Erectile dysfunction due to arterial insufficiency 09/24/2020   Benign prostatic hyperplasia with weak urinary stream 05/05/2018   Hyperlipidemia LDL goal <100 05/05/2018   Pure hyperglyceridemia 05/05/2018   Psychophysiological insomnia 05/05/2018   Lumbar radiculopathy 07/15/2017   Hyperlipidemia associated with type 2 diabetes mellitus ([place]) 06/10/2017   Essential hypertension 06/10/2017   Type 2 diabetes mellitus without complication, without long-term current use of insulin ([place]) 06/10/2017   Spondylolisthesis, lumbar region 04/21/2017   Past Medical History:  Diagnosis Date   Arthritis    Diabetes mellitus without complication ([place])    type 2; tx metformin and wkly ozempic on Sundays   Hair loss    History of kidney stones    surgery to remove   Hyperlipidemia    tx w/atorvastatin   Hypertension    tx w/lisinopril   Insomnia    tx w/ambien   Family History  Problem Relation Age of Onset   Cancer Mother    Diabetes Mother    Mental illness Mother    Heart disease Father    Arthritis Brother    Past Surgical History:  Procedure Laterality Date   APPENDECTOMY     COLONOSCOPY     I & D KNEE WITH POLY EXCHANGE Right 01/24/2022   Procedure: IRRIGATION AND DEBRIDEMENT RIGHT KNEE WITH POLY EXCHANGE;  Surgeon: Newt Minion, MD;  Location: [facility];  Service: Orthopedics;  Laterality: Right;   LITHOTRIPSY     REPLACEMENT TOTAL  KNEE Right    TOTAL KNEE REVISION Right 04/22/2017   Procedure: REVISION RIGHT TOTAL KNEE ARTHROPLASTY VS. POLY EXCHANGE;  Surgeon: Newt Minion, MD;  Location: [facility];  Service: Orthopedics;  Laterality: Right;   Social History   Occupational History   Not on file  Tobacco Use   Smoking status: Never    Passive exposure: Never   Smokeless tobacco: Never  Vaping Use   Vaping Use: Never used  Substance and Sexual Activity   Alcohol use: Yes    Alcohol/week: 7.0 standard drinks of alcohol    Types: 7 Glasses of wine per week    Comment: wine   Drug use: No   Sexual activity: Not Currently    Partners: Male

## 2022-02-27 ENCOUNTER — Other Ambulatory Visit (HOSPITAL_COMMUNITY): Payer: Self-pay

## 2022-02-28 ENCOUNTER — Ambulatory Visit (INDEPENDENT_AMBULATORY_CARE_PROVIDER_SITE_OTHER): Payer: PPO | Admitting: Physical Therapy

## 2022-02-28 ENCOUNTER — Encounter: Payer: Self-pay | Admitting: Physical Therapy

## 2022-02-28 DIAGNOSIS — R600 Localized edema: Secondary | ICD-10-CM | POA: Diagnosis not present

## 2022-02-28 DIAGNOSIS — M6281 Muscle weakness (generalized): Secondary | ICD-10-CM

## 2022-02-28 DIAGNOSIS — M25661 Stiffness of right knee, not elsewhere classified: Secondary | ICD-10-CM

## 2022-02-28 DIAGNOSIS — M25561 Pain in right knee: Secondary | ICD-10-CM | POA: Diagnosis not present

## 2022-02-28 IMAGING — CT CT CARDIAC CORONARY ARTERY CALCIUM SCORE
3 series · 14 of 20 positions shown, 16 images · non-contrast
Comparison: None.
COMPARISON: None.

Addendum:
EXAM:
OVER-READ INTERPRETATION  CT CHEST

The following report is an over-read performed by radiologist Dr.
Qiufang Summers [REDACTED] on 06/27/2020. This
over-read does not include interpretation of cardiac or coronary
anatomy or pathology. The coronary calcium score interpretation by
the cardiologist is attached.
CLINICAL DATA: Cardiovascular Disease Risk stratification
Coronary Calcium Score
TECHNIQUE: A gated, non-contrast computed tomography scan of the heart was
performed using 3mm slice thickness. Axial images were analyzed on a
dedicated workstation. Calcium scoring of the coronary arteries was
performed using the Agatston method.

[Series 3: casc 3.0 best diast 71 % (id) · axial · 0.41mm/px · z∈[+1286,+1368]mm · 4 of 47 slices shown]
[im 10/47  vessel]
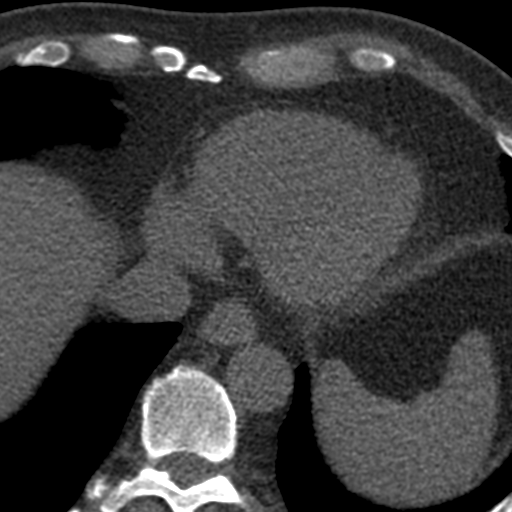
[im 19/47  vessel]
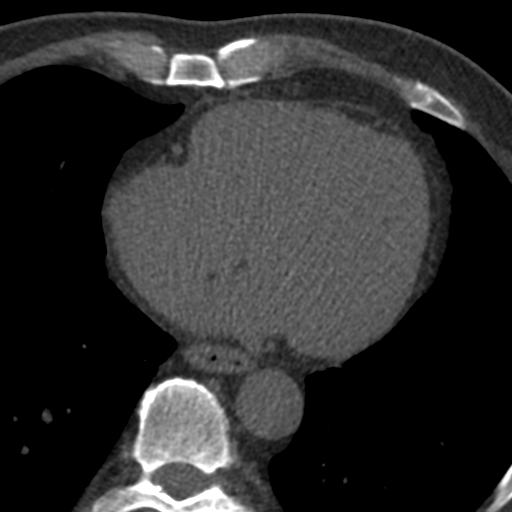
[im 28/47  vessel]
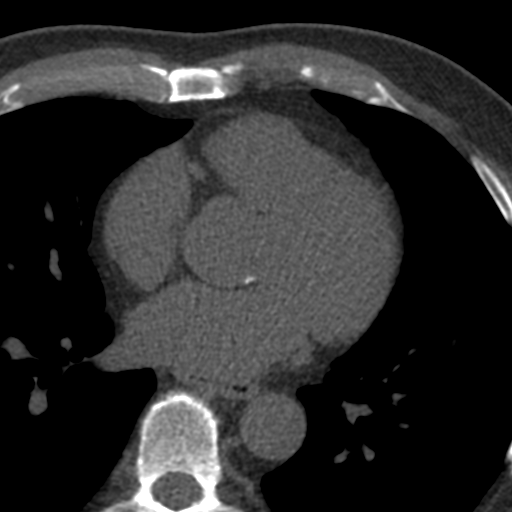
[im 37/47  vessel]
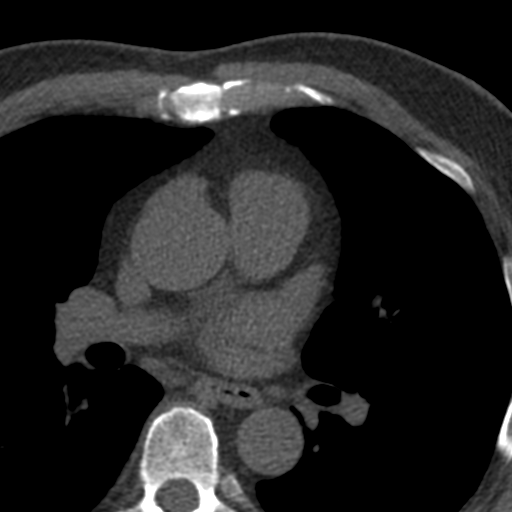

[Series 4: soft full fov 71 % · axial · 0.71mm/px · z∈[+1280,+1374]mm · 5 of 47 slices shown, 7 images]
[im 8/47  vessel]
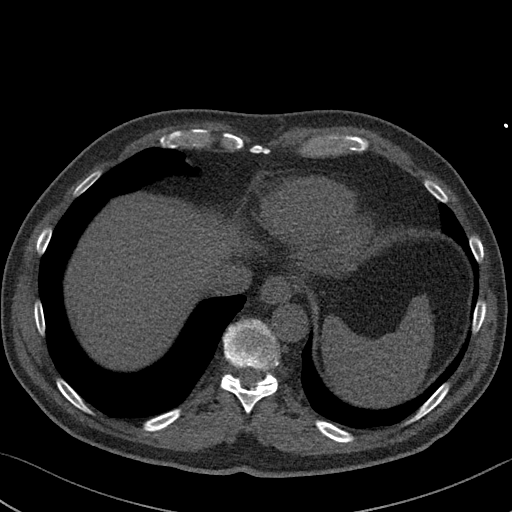
[im 8/47  lung]
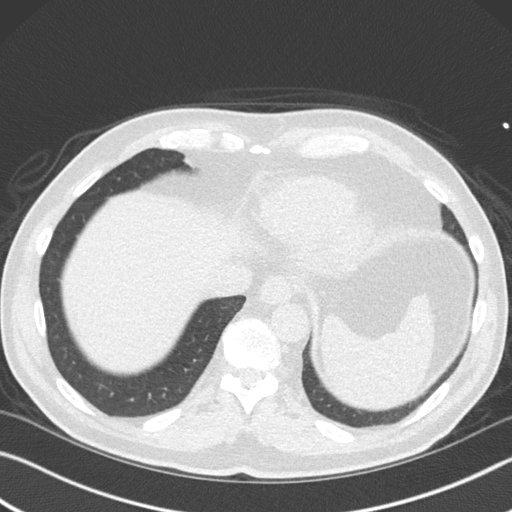
[im 16/47  vessel]
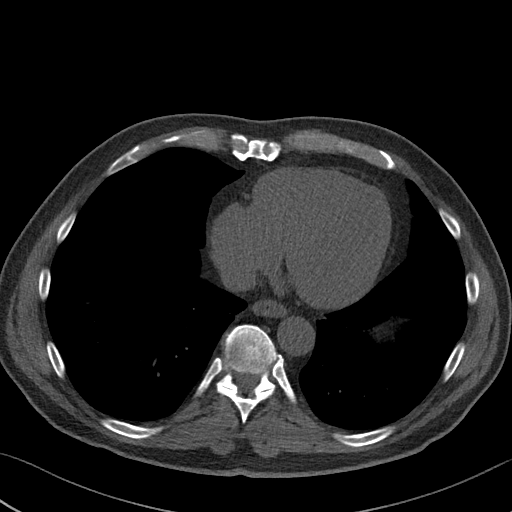
[im 24/47  vessel]
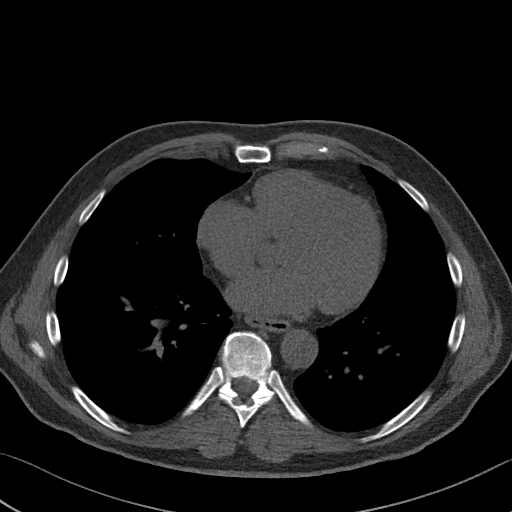
[im 31/47  vessel]
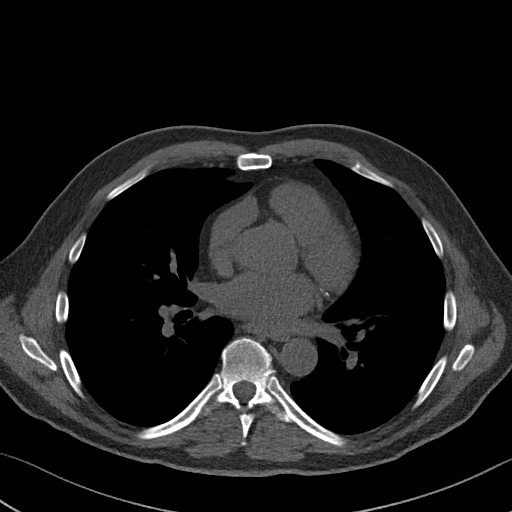
[im 39/47  vessel]
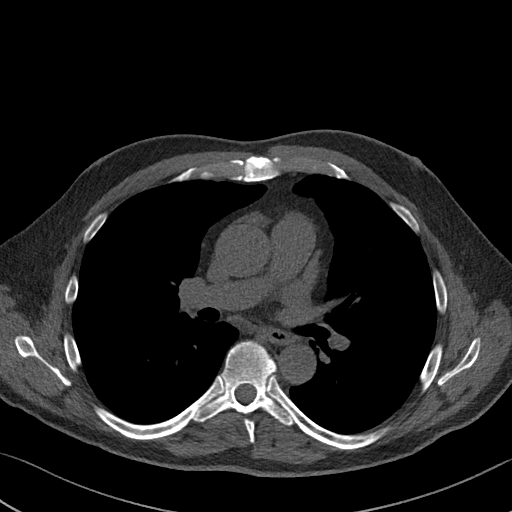
[im 39/47  lung]
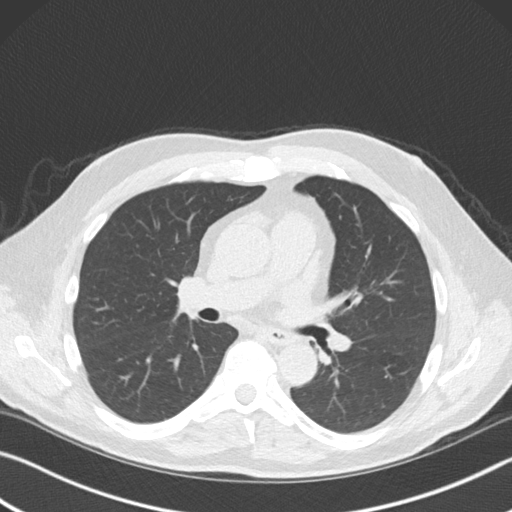

[Series 5: lungs 71 % · axial · 0.71mm/px · z∈[+1280,+1374]mm · 5 of 47 slices shown]
[im 8/47  vessel]
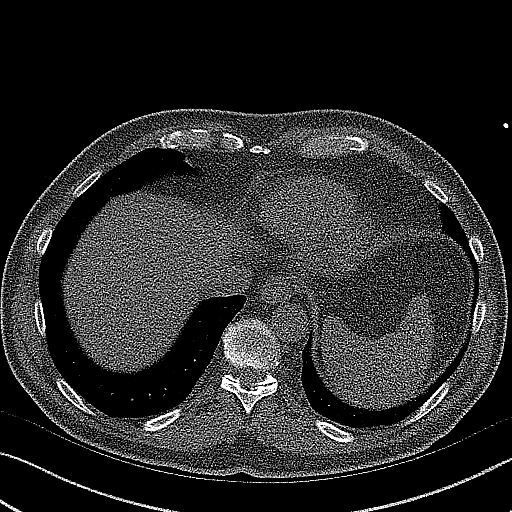
[im 16/47  vessel]
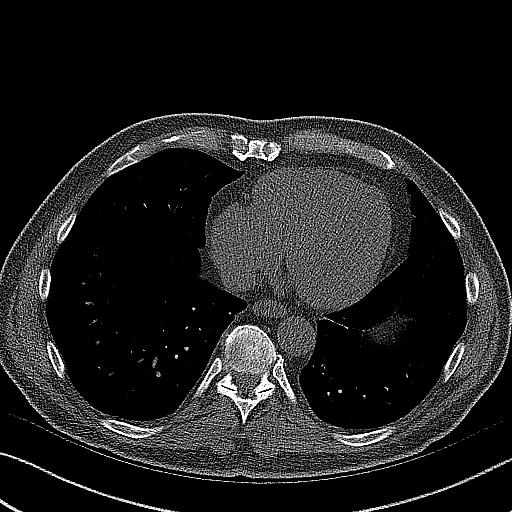
[im 24/47  vessel]
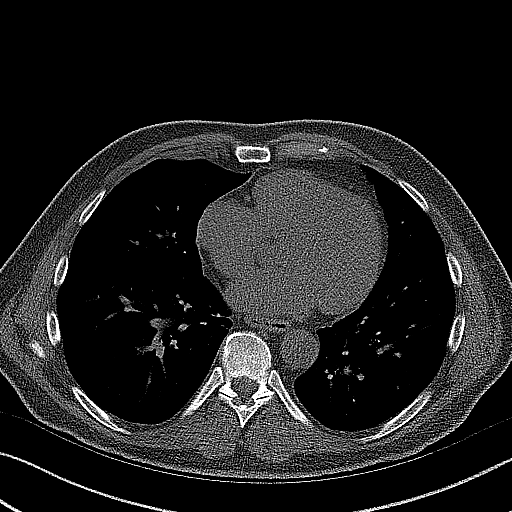
[im 31/47  vessel]
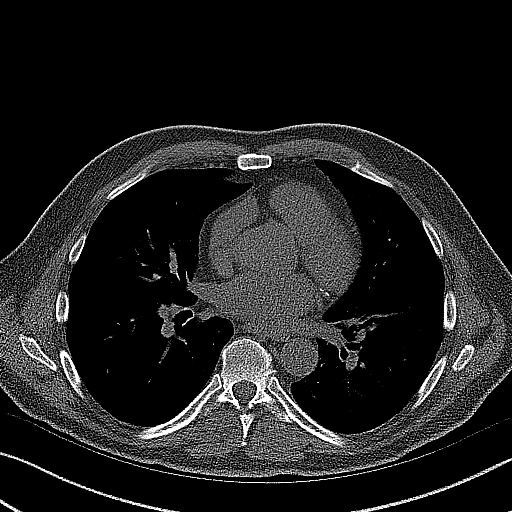
[im 39/47  vessel]
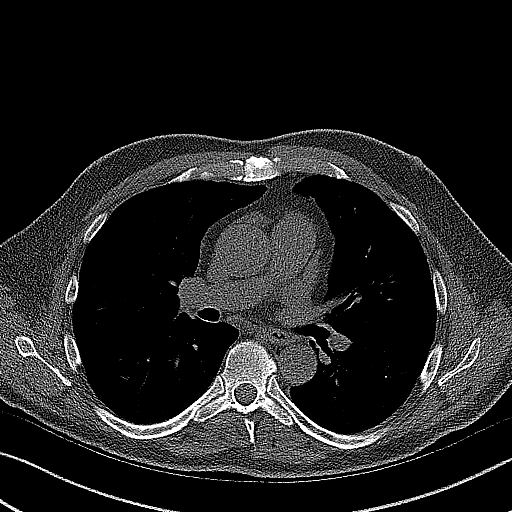

[14 of 20 positions shown; findings below may reference images not displayed]

FINDINGS: Vascular: No incidental noncardiac vascular findings in the imaged
chest.

Mediastinum/Nodes: Visualized mediastinum and hilar regions
demonstrate no lymphadenopathy or masses.

Lungs/Pleura: Visualized lungs show no evidence of pulmonary edema,
consolidation, pneumothorax, nodule or pleural fluid.

Upper Abdomen: No acute abnormality.

Musculoskeletal: No chest wall mass or suspicious bone lesions
identified.
IMPRESSION: No significant incidental findings.
FINDINGS: Coronary arteries: Normal origins.

Coronary Calcium Score:

Left main: 0

Left anterior descending artery: 57

Left circumflex artery: 15

Right coronary artery: 0

Total: 72

Percentile: 34

Pericardium: Normal.

Ascending Aorta: Mild dilatation of ascending aorta measuring 40mm

Non-cardiac: See separate report from [REDACTED].
IMPRESSION: 1. Coronary calcium score of 72. This was 34th percentile for age-,
race-, and sex-matched controls.

2.  Mild dilatation of ascending aorta measuring 40mm



If CAC=0, it is reasonable to withhold statin therapy and reassess
in 5 to 10 years, as long as higher risk conditions are absent
(diabetes mellitus, family history of premature CHD in first degree
relatives (males <55 years; females <65 years), cigarette smoking,
or LDL >=190 mg/dL).

If CAC is 1 to 99, it is reasonable to initiate statin therapy for
patients >=55 years of age.

If CAC is >=100 or >=75th percentile, it is reasonable to initiate
statin therapy at any age.

Cardiology referral should be considered for patients with CAC
scores >=400 or >=75th percentile.

*4217 AHA/ACC/AACVPR/AAPA/ABC/KEYLI/HOYRAZ/JEROTIC/Audiel/RAMIEZ/JUMPER/ANTENEH
Guideline on the Management of Blood Cholesterol: A Report of the
American College of Cardiology/American Heart Association Task Force
on Clinical Practice Guidelines. J Am Coll Cardiol.
6648;73(24):2502-2263.

*** End of Addendum ***
EXAM:
OVER-READ INTERPRETATION  CT CHEST

The following report is an over-read performed by radiologist Dr.
Qiufang Summers [REDACTED] on 06/27/2020. This
over-read does not include interpretation of cardiac or coronary
anatomy or pathology. The coronary calcium score interpretation by
the cardiologist is attached.
FINDINGS: Vascular: No incidental noncardiac vascular findings in the imaged
chest.

Mediastinum/Nodes: Visualized mediastinum and hilar regions
demonstrate no lymphadenopathy or masses.

Lungs/Pleura: Visualized lungs show no evidence of pulmonary edema,
consolidation, pneumothorax, nodule or pleural fluid.

Upper Abdomen: No acute abnormality.

Musculoskeletal: No chest wall mass or suspicious bone lesions
identified.
IMPRESSION: No significant incidental findings.

## 2022-02-28 NOTE — Therapy (Signed)
OUTPATIENT PHYSICAL THERAPY TREATMENT NOTE DISCHARGE SUMMARY   Patient Name: Adam Ware, Dr. MRN: [id number] DOB:05-Jan-1951, 72 y.o., male Today's Date: 02/28/2022  END OF SESSION:   PT End of Session - 02/28/22 0852     Visit Number 2    Number of Visits 6    Date for PT Re-Evaluation 03/20/22    Authorization Type [employer]    Progress Note Due on Visit 10    PT Start Time 0848    Activity Tolerance Patient tolerated treatment well    Behavior During Therapy [facility] for tasks assessed/performed             Past Medical History:  Diagnosis Date   Arthritis    Diabetes mellitus without complication ([place])    type 2; tx metformin and wkly ozempic on Sundays   Hair loss    History of kidney stones    surgery to remove   Hyperlipidemia    tx w/atorvastatin   Hypertension    tx w/lisinopril   Insomnia    tx w/ambien   Past Surgical History:  Procedure Laterality Date   APPENDECTOMY     COLONOSCOPY     I & D KNEE WITH POLY EXCHANGE Right 01/24/2022   Procedure: IRRIGATION AND DEBRIDEMENT RIGHT KNEE WITH POLY EXCHANGE;  Surgeon: Newt Minion, MD;  Location: [place];  Service: Orthopedics;  Laterality: Right;   LITHOTRIPSY     REPLACEMENT TOTAL KNEE Right    TOTAL KNEE REVISION Right 04/22/2017   Procedure: REVISION RIGHT TOTAL KNEE ARTHROPLASTY VS. POLY EXCHANGE;  Surgeon: Newt Minion, MD;  Location: [place];  Service: Orthopedics;  Laterality: Right;   Patient Active Problem List   Diagnosis Date Noted   Failed total knee arthroplasty, initial encounter ([place]) 01/24/2022   Total knee replacement status, right 01/24/2022   Intermittent palpitations 12/11/2021   Encounter for general adult medical examination with abnormal findings 06/12/2021   Carpal tunnel syndrome, left upper limb 11/28/2020   Carpal tunnel syndrome, right upper limb 11/28/2020   Erectile dysfunction due to arterial insufficiency 09/24/2020   Benign prostatic hyperplasia with weak  urinary stream 05/05/2018   Hyperlipidemia LDL goal <100 05/05/2018   Pure hyperglyceridemia 05/05/2018   Psychophysiological insomnia 05/05/2018   Lumbar radiculopathy 07/15/2017   Hyperlipidemia associated with type 2 diabetes mellitus ([place]) 06/10/2017   Essential hypertension 06/10/2017   Type 2 diabetes mellitus without complication, without long-term current use of insulin ([place]) 06/10/2017   Spondylolisthesis, lumbar region 04/21/2017     THERAPY DIAG:  Acute pain of right knee  Stiffness of right knee, not elsewhere classified  Localized edema  Muscle weakness (generalized)   PCP: Scarlette Calico, MD  REFERRING PROVIDER: Newt Minion, MD  REFERRING DIAG: [phone] (ICD-10-CM) - Total knee replacement status, right  THERAPY DIAG:  Acute pain of right knee - Plan: PT plan of care cert/re-cert  Stiffness of right knee, not elsewhere classified - Plan: PT plan of care cert/re-cert  Localized edema - Plan: PT plan of care cert/re-cert  Muscle weakness (generalized) - Plan: PT plan of care cert/re-cert  Rationale for Evaluation and Treatment: Rehabilitation  ONSET DATE: 01/24/22  SUBJECTIVE:   SUBJECTIVE STATEMENT: Incision is still taking time to heal  PERTINENT HISTORY: arthritis, DM, HLD, HTN, R TKA 1996, R TKA revision 2019  PAIN:  Are you having pain? Yes: NPRS scale: did not rate/10 Pain location: Rt knee Pain description: sore Aggravating factors: bending knee Relieving factors: ice/heat, ibuprofen or  voltaren  PRECAUTIONS: None  WEIGHT BEARING RESTRICTIONS: No  FALLS:  Has patient fallen in last 6 months? 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  PLOF: Independent and Leisure: walking, golf  PATIENT GOALS: unsure, overall doing well  NEXT MD VISIT: PRN  OBJECTIVE:   PATIENT SURVEYS:   02/06/22 FOTO 67 (predicted 13) 02/28/22: FOTO 87  COGNITION: Overall cognitive status: Within functional limits for tasks assessed     SENSATION: WFL  EDEMA:  02/06/22: increased Rt knee swelling into distal lower extremity; some LLE edema as well but only distal  LOWER EXTREMITY ROM:  Active ROM Right eval Right 02/28/22  Knee flexion 90 110  Knee extension -1 (seated LAQ) 3 (Hyper)   (Blank rows = not tested)  Passive ROM Right eval Left eval  Knee flexion 94   Knee extension 2  (hyper)    (Blank rows = not tested)  LOWER EXTREMITY MMT:  Deferred formal testing today; grossly 3/5 due to post op status  MMT Right eval Left eval  Hip flexion    Hip extension    Hip abduction    Hip adduction    Hip internal rotation    Hip external rotation    Knee flexion    Knee extension    Ankle dorsiflexion    Ankle plantarflexion    Ankle inversion    Ankle eversion     (Blank rows = not tested)  GAIT: 02/06/22:  Distance walked: within clinic Assistive device utilized: None Level of assistance: Complete Independence Comments: mild antalgic gait with decreased Rt TKE   TODAY'S TREATMENT:                                                                                                                              DATE:  02/28/22 TherEx ROM measurements - see above Pt reports continued exercise - no concerns about HEP or gym exercises  Self Care Assessed incision with pt, possible suture coming out; MD aware and pt able to self monitor at this time    02/06/22 See HEP - discussed gym program as well as various ways to increase knee flexion    PATIENT EDUCATION:  Education details: HEP Person educated: Patient Education method: Explanation, Demonstration, and Handouts Education comprehension: verbalized understanding, returned demonstration, and needs further education  HOME EXERCISE PROGRAM: Access Code: [email or web address] URL:  [email or web address] Date: 02/06/2022 Prepared by: Faustino Congress  Exercises - Seated Straight Leg Raise  - 2-3 x daily - 7 x weekly - 2 sets - 10 reps - 5 sec hold - Supine Heel Slide with Strap  - 2-3 x daily - 7 x weekly - 2 sets - 10 reps - Seated Knee Flexion AAROM  - 2-3 x daily - 7 x weekly - 2 sets - 10 reps - 10 sec hold -  Seated Knee Flexion AAROM  - 2-3 x daily - 7 x weekly - 2 sets - 10 reps - 5-10 hold - Recumbent Bike  - 1 x daily - 7 x weekly - 3 sets - 10 reps - Single Leg Knee Extension with Weight Machine  - 1 x daily - 7 x weekly - 3 sets - 10 reps - Single Leg Hamstring Curl with Weight Machine  - 1 x daily - 7 x weekly - 3 sets - 10 reps - Single Leg Press  - 1 x daily - 7 x weekly - 3 sets - 10 reps  ASSESSMENT:  CLINICAL IMPRESSION: Pt has met all goals and is ready for d/c from PT.  Will d/c PT today.  OBJECTIVE IMPAIRMENTS: Abnormal gait, decreased mobility, decreased ROM, decreased strength, increased edema, and pain.   ACTIVITY LIMITATIONS: bending, standing, squatting, stairs, transfers, and locomotion level  PARTICIPATION LIMITATIONS: meal prep, cleaning, laundry, driving, community activity, occupation, and yard work  PERSONAL FACTORS: 3+ comorbidities: arthritis, DM, HLD, HTN, R TKA 1996, R TKA revision 2019 are also affecting patient's functional outcome.   REHAB POTENTIAL: Good  CLINICAL DECISION MAKING: Evolving/moderate complexity  EVALUATION COMPLEXITY: Moderate   GOALS: Goals reviewed with patient? Yes  SHORT TERM GOALS: Target date: 02/27/2022  Independent with initial HEP Goal status: MET 02/28/22  2.  Rt knee flexion improved to 100 deg active for improved function Goal status: MET 02/28/22   LONG TERM GOALS: Target date: 03/20/2022  Independent with final HEP Goal status: MET 02/28/22  2.  FOTO score improved to 66 Goal status: MET 02/28/22  3.  Rt knee AROM improved to 0-110 for improved function and  mobility Goal status: MET 02/28/22  4.  Report pain < 2/10 with flexion and walking activities for improved function Goal status: MET 02/28/22   PLAN:  PT FREQUENCY: 1x/week anticipate 1 visit every 3 weeks, but will see up to 1x/wk if needed  PT DURATION: 6 weeks  PLANNED INTERVENTIONS: Therapeutic exercises, Therapeutic activity, Neuromuscular re-education, Balance training, Gait training, Patient/Family education, Self Care, Joint mobilization, Stair training, Aquatic Therapy, Dry Needling, Electrical stimulation, Cryotherapy, Moist heat, Taping, Vasopneumatic device, Ionotophoresis '4mg'$ /ml Dexamethasone, Manual therapy, and Re-evaluation  PLAN FOR NEXT SESSION: d/c PT     Laureen Abrahams, PT, DPT 02/28/22 9:21 AM    PHYSICAL THERAPY DISCHARGE SUMMARY  Visits from Start of Care: 2  Current functional level related to goals / functional outcomes: See above   Remaining deficits: See above   Education / Equipment: HEP   Patient agrees to discharge. Patient goals were met. Patient is being discharged due to meeting the stated rehab goals.  Laureen Abrahams, PT, DPT 02/28/22 9:21 AM  [facility] [address] [place], [place], [address] Phone: [phone]   Fax:  [phone]

## 2022-03-06 ENCOUNTER — Other Ambulatory Visit (HOSPITAL_COMMUNITY): Payer: Self-pay

## 2022-03-10 ENCOUNTER — Other Ambulatory Visit (HOSPITAL_COMMUNITY): Payer: Self-pay

## 2022-03-11 ENCOUNTER — Other Ambulatory Visit (HOSPITAL_COMMUNITY): Payer: Self-pay

## 2022-03-11 ENCOUNTER — Telehealth: Payer: Self-pay | Admitting: Internal Medicine

## 2022-03-11 NOTE — Telephone Encounter (Signed)
Mickel Baas from [facility] called to speak about the pt. Needs to discuss medication management, would not give further details.   Please call Mickel Baas at: [phone] EXT. [phone]

## 2022-03-11 NOTE — Telephone Encounter (Signed)
Mickel Baas, assistant to pt at [place] location, called to inquire if [facility] is waiting for PA. Pt has been unable to obtain medication from the pharmacy.

## 2022-03-11 NOTE — Telephone Encounter (Signed)
Called pharmacy & Darcel Bayley was rejected sent P.A. KEY [id number] called Ivin Booty CMA at  Dr. Ronnald Ramp and asked for follow up on P.A. she will send to P.A. team

## 2022-03-12 ENCOUNTER — Other Ambulatory Visit (HOSPITAL_COMMUNITY): Payer: Self-pay

## 2022-03-12 ENCOUNTER — Other Ambulatory Visit: Payer: Self-pay

## 2022-03-13 ENCOUNTER — Other Ambulatory Visit (HOSPITAL_COMMUNITY): Payer: Self-pay

## 2022-03-13 NOTE — Telephone Encounter (Signed)
PA pending

## 2022-03-13 NOTE — Telephone Encounter (Signed)
Key: [id number]

## 2022-03-14 NOTE — Telephone Encounter (Signed)
PA has been approved.

## 2022-03-15 ENCOUNTER — Other Ambulatory Visit (HOSPITAL_COMMUNITY): Payer: Self-pay

## 2022-03-27 ENCOUNTER — Other Ambulatory Visit (HOSPITAL_COMMUNITY): Payer: Self-pay

## 2022-03-28 ENCOUNTER — Other Ambulatory Visit (HOSPITAL_COMMUNITY): Payer: Self-pay

## 2022-03-28 ENCOUNTER — Other Ambulatory Visit: Payer: Self-pay

## 2022-04-17 ENCOUNTER — Other Ambulatory Visit: Payer: Self-pay | Admitting: Internal Medicine

## 2022-04-17 DIAGNOSIS — E119 Type 2 diabetes mellitus without complications: Secondary | ICD-10-CM

## 2022-04-17 DIAGNOSIS — I10 Essential (primary) hypertension: Secondary | ICD-10-CM

## 2022-04-18 ENCOUNTER — Other Ambulatory Visit (HOSPITAL_BASED_OUTPATIENT_CLINIC_OR_DEPARTMENT_OTHER): Payer: Self-pay

## 2022-04-18 ENCOUNTER — Other Ambulatory Visit: Payer: Self-pay

## 2022-04-18 MED ORDER — LISINOPRIL 10 MG PO TABS
10.0000 mg | ORAL_TABLET | Freq: Every day | ORAL | 0 refills | Status: DC
  Filled 2022-04-18: qty 90, 90d supply, fill #0

## 2022-05-09 ENCOUNTER — Telehealth: Payer: Self-pay | Admitting: Podiatry

## 2022-05-09 ENCOUNTER — Ambulatory Visit (INDEPENDENT_AMBULATORY_CARE_PROVIDER_SITE_OTHER): Payer: PPO

## 2022-05-09 ENCOUNTER — Other Ambulatory Visit: Payer: Self-pay

## 2022-05-09 ENCOUNTER — Encounter: Payer: Self-pay | Admitting: Sports Medicine

## 2022-05-09 ENCOUNTER — Ambulatory Visit (INDEPENDENT_AMBULATORY_CARE_PROVIDER_SITE_OTHER): Payer: PPO | Admitting: Sports Medicine

## 2022-05-09 ENCOUNTER — Ambulatory Visit (INDEPENDENT_AMBULATORY_CARE_PROVIDER_SITE_OTHER): Payer: PPO | Admitting: Podiatry

## 2022-05-09 DIAGNOSIS — M778 Other enthesopathies, not elsewhere classified: Secondary | ICD-10-CM

## 2022-05-09 DIAGNOSIS — M25511 Pain in right shoulder: Secondary | ICD-10-CM | POA: Diagnosis not present

## 2022-05-09 DIAGNOSIS — M19072 Primary osteoarthritis, left ankle and foot: Secondary | ICD-10-CM | POA: Diagnosis not present

## 2022-05-09 DIAGNOSIS — M21962 Unspecified acquired deformity of left lower leg: Secondary | ICD-10-CM

## 2022-05-09 DIAGNOSIS — M2042 Other hammer toe(s) (acquired), left foot: Secondary | ICD-10-CM

## 2022-05-09 DIAGNOSIS — G8929 Other chronic pain: Secondary | ICD-10-CM | POA: Diagnosis not present

## 2022-05-09 MED ORDER — LIDOCAINE HCL 1 % IJ SOLN
2.0000 mL | INTRAMUSCULAR | Status: AC | PRN
  Administered 2022-05-09: 2 mL

## 2022-05-09 MED ORDER — METHYLPREDNISOLONE ACETATE 40 MG/ML IJ SUSP
40.0000 mg | INTRAMUSCULAR | Status: AC | PRN
  Administered 2022-05-09: 40 mg via INTRA_ARTICULAR

## 2022-05-09 MED ORDER — BUPIVACAINE HCL 0.25 % IJ SOLN
2.0000 mL | INTRAMUSCULAR | Status: AC | PRN
  Administered 2022-05-09: 2 mL via INTRA_ARTICULAR

## 2022-05-09 NOTE — Progress Notes (Signed)
   Procedure Note  Patient: Adam Ware, Dr.             Date of Birth: Mar 08, 1950           MRN: [id number]             Visit Date: 05/09/2022  Procedures: Visit Diagnoses:  1. Chronic right shoulder pain    Large Joint Inj: R subacromial bursa on 05/09/2022 4:21 PM Indications: pain Details: 22 G 1.5 in needle, ultrasound-guided lateral approach Medications: 2 mL lidocaine 1 %; 2 mL bupivacaine 0.25 %; 40 mg methylPREDNISolone acetate 40 MG/ML Outcome: tolerated well, no immediate complications  US-Guided Subacromial Injection, Right Shoulder  After discussion on risks/benefits/indications, informed verbal consent was obtained. A timeout was then performed. Patient was seated on table in exam room. The patient's shoulder was prepped with betadine and alcohol swabs and utilizing lateral approach with ultrasound guidance, the patient's subacromial space was injected with 2:2:1 mixture of lidocaine:bupivicaine:depomedrol via an in-plane approach. Patient tolerated the procedure well without immediate complications.   Procedure, treatment alternatives, risks and benefits explained, specific risks discussed. Consent was given by the patient. Immediately prior to procedure a time out was called to verify the correct patient, procedure, equipment, support staff and site/side marked as required. Patient was prepped and draped in the usual sterile fashion.     - Technically successful US-guided SAJ injection - May use ice/heat or OTC-antiinflammatories for any post-injection pain - Follow-up with me as needed  Elba Barman, DO Primary Care Sports Medicine Physician  [place]

## 2022-05-09 NOTE — Progress Notes (Unsigned)
Subjective:   Patient ID: Adam Ware, Dr., male   DOB: 72 y.o.   MRN: [id number]   HPI Chief Complaint  Patient presents with   Foot Orthotics    pATIENT HERE TO REESTABLISH CARE AND HAVE FEET CAST FOT ORTHOTICS, PATIENT WOULD LIKE TO HAVE FEET EVALUATED AND GIVE OPTIONS    72 year old male presents the office today with the above concerns.  He previously seen Dr. Paulla Dolly in the office in 2019 and was diagnosed with a plantar plate injury.  Over time second toe is contracted that morning gets pain in the second interspace and submetatarsal.  Does not report any recent injury.  He did have an injury at the time the symptoms did start.  No numbness or tingling or any radiating pain.   Review of Systems  All other systems reviewed and are negative.  Past Medical History:  Diagnosis Date   Arthritis    Diabetes mellitus without complication ([place])    type 2; tx metformin and wkly ozempic on Sundays   Hair loss    History of kidney stones    surgery to remove   Hyperlipidemia    tx w/atorvastatin   Hypertension    tx w/lisinopril   Insomnia    tx w/ambien    Past Surgical History:  Procedure Laterality Date   APPENDECTOMY     COLONOSCOPY     I & D KNEE WITH POLY EXCHANGE Right 01/24/2022   Procedure: IRRIGATION AND DEBRIDEMENT RIGHT KNEE WITH POLY EXCHANGE;  Surgeon: Newt Minion, MD;  Location: [place];  Service: Orthopedics;  Laterality: Right;   LITHOTRIPSY     REPLACEMENT TOTAL KNEE Right    TOTAL KNEE REVISION Right 04/22/2017   Procedure: REVISION RIGHT TOTAL KNEE ARTHROPLASTY VS. POLY EXCHANGE;  Surgeon: Newt Minion, MD;  Location: [place];  Service: Orthopedics;  Laterality: Right;     Current Outpatient Medications:    atorvastatin (LIPITOR) 10 MG tablet, Take 1 tablet (10 mg total) by mouth 3 (three) times a week. (Patient taking differently: Take 10 mg by mouth every Monday, Wednesday, and Friday.), Disp: 90 tablet, Rfl: 1   finasteride (PROSCAR) 5 MG tablet, Take  1 tablet (5 mg total) by mouth daily. (Patient taking differently: Take 1.25 mg by mouth daily.), Disp: 90 tablet, Rfl: 1   HYDROcodone-acetaminophen (NORCO/VICODIN) 5-325 MG tablet, Take 1 tablet by mouth every 4 (four) hours as needed., Disp: 30 tablet, Rfl: 0   ibuprofen (ADVIL) 800 MG tablet, Take 800 mg by mouth every 8 (eight) hours as needed for moderate pain., Disp: , Rfl:    lisinopril (ZESTRIL) 10 MG tablet, Take 1 tablet by mouth daily., Disp: 90 tablet, Rfl: 0   metFORMIN (GLUCOPHAGE-XR) 500 MG 24 hr tablet, Take 1 tablet (500 mg total) by mouth daily with breakfast., Disp: 90 tablet, Rfl: 1   methocarbamol (ROBAXIN) 500 MG tablet, Take 1 tablet (500 mg total) by mouth every 6 (six) hours as needed for muscle spasms., Disp: 30 tablet, Rfl: 0   oxyCODONE-acetaminophen (PERCOCET/ROXICET) 5-325 MG tablet, Take 1 tablet by mouth every 4 (four) hours as needed., Disp: 10 tablet, Rfl: 0   tadalafil (CIALIS) 20 MG tablet, Take 1 tablet (20 mg total) by mouth daily as needed for erectile dysfunction., Disp: 30 tablet, Rfl: 1   tirzepatide (MOUNJARO) 7.5 MG/0.5ML Pen, Inject 7.5 mg into the skin once a week., Disp: 6 mL, Rfl: 0   zolpidem (AMBIEN CR) 6.25 MG CR tablet, Take 1  tablet (6.25 mg total) by mouth at bedtime as needed for sleep., Disp: 90 tablet, Rfl: 1  Allergies  Allergen Reactions   Sulfa Antibiotics Other (See Comments)    Fixed drug reaction           Objective:  Physical Exam  General: AAO x3, NAD  Dermatological: Skin is warm, dry and supple bilateral. There are no open sores, no preulcerative lesions, no rash or signs of infection present.  Vascular: Dorsalis Pedis artery and Posterior Tibial artery pedal pulses are 2/4 bilateral with immedate capillary fill time. There is no pain with calf compression, swelling, warmth, erythema.   Neruologic: Grossly intact via light touch bilateral.   Musculoskeletal: There is prominence of metatarsal head submetatarsal 2 and  3.  Rigid hammertoe contractures present of the second digit.  Upon weightbearing there is splaying of the digit.  There is decreased medial arch height.  Dorsal spurring present.  Gait: Unassisted, Nonantalgic.       Assessment:   72 year old male with capsulitis, hammertoe deformity     Plan:  -Treatment options discussed including all alternatives, risks, and complications -Etiology of symptoms were discussed -X-rays were obtained and reviewed with the patient.  3 views of the foot were obtained.  Elongated second metatarsal.  Hammertoe present.  No evidence of acute fracture. -We discussed injection and ultimately held off on this today.  This is an ongoing issue.  Will try to make a custom orthotic to help offload the metatarsal heads and also get better arch support.  We did also briefly discuss surgical intervention if needed.  Will continue with conservative care for now. -He was measured for orthotics today and I also completed [employer] prior authorization paperwork.  Trula Slade DPM

## 2022-05-09 NOTE — Telephone Encounter (Signed)
Prior authorization faxed to [employer]

## 2022-05-12 ENCOUNTER — Other Ambulatory Visit: Payer: Self-pay | Admitting: Internal Medicine

## 2022-05-12 ENCOUNTER — Other Ambulatory Visit (HOSPITAL_COMMUNITY): Payer: Self-pay

## 2022-05-12 DIAGNOSIS — E119 Type 2 diabetes mellitus without complications: Secondary | ICD-10-CM

## 2022-05-12 MED ORDER — TIRZEPATIDE 15 MG/0.5ML ~~LOC~~ SOAJ
15.0000 mg | SUBCUTANEOUS | 0 refills | Status: DC
  Filled 2022-05-12: qty 2, 28d supply, fill #0
  Filled 2022-06-27: qty 2, 28d supply, fill #1
  Filled 2022-07-17 – 2022-07-26 (×2): qty 2, 28d supply, fill #2

## 2022-05-19 ENCOUNTER — Other Ambulatory Visit (HOSPITAL_COMMUNITY): Payer: Self-pay

## 2022-05-20 NOTE — Telephone Encounter (Signed)
Prior auth approved for orthotics    Auth  [id number]  documentation being sent to scan center

## 2022-05-22 ENCOUNTER — Other Ambulatory Visit: Payer: Self-pay

## 2022-05-26 ENCOUNTER — Ambulatory Visit (INDEPENDENT_AMBULATORY_CARE_PROVIDER_SITE_OTHER): Payer: PPO | Admitting: Podiatry

## 2022-05-26 DIAGNOSIS — M21962 Unspecified acquired deformity of left lower leg: Secondary | ICD-10-CM

## 2022-05-26 DIAGNOSIS — M19072 Primary osteoarthritis, left ankle and foot: Secondary | ICD-10-CM

## 2022-05-26 DIAGNOSIS — M2042 Other hammer toe(s) (acquired), left foot: Secondary | ICD-10-CM

## 2022-05-26 DIAGNOSIS — M778 Other enthesopathies, not elsewhere classified: Secondary | ICD-10-CM

## 2022-05-26 NOTE — Progress Notes (Signed)
Patient presents to the office today with issues concerning their custom inserts. Patient states that the insert is too thick. Spoke with Dr Ardelle Anton along with patient and this orthotic will be sent back to [facility] to be changed to a dress orthotic.  Will send orthotics to lab for adjustments.  Advised patient that he will receive a call from the office to come in for a fitting.

## 2022-06-03 ENCOUNTER — Other Ambulatory Visit (HOSPITAL_COMMUNITY): Payer: Self-pay

## 2022-06-09 ENCOUNTER — Other Ambulatory Visit: Payer: Self-pay | Admitting: Internal Medicine

## 2022-06-09 DIAGNOSIS — E119 Type 2 diabetes mellitus without complications: Secondary | ICD-10-CM

## 2022-06-10 ENCOUNTER — Other Ambulatory Visit: Payer: Self-pay

## 2022-06-10 ENCOUNTER — Other Ambulatory Visit (HOSPITAL_COMMUNITY): Payer: Self-pay

## 2022-06-10 MED ORDER — METFORMIN HCL ER 500 MG PO TB24
500.0000 mg | ORAL_TABLET | Freq: Every day | ORAL | 0 refills | Status: DC
  Filled 2022-06-10: qty 90, 90d supply, fill #0

## 2022-06-11 ENCOUNTER — Other Ambulatory Visit (HOSPITAL_COMMUNITY): Payer: Self-pay

## 2022-06-27 ENCOUNTER — Other Ambulatory Visit (HOSPITAL_COMMUNITY): Payer: Self-pay

## 2022-06-30 ENCOUNTER — Other Ambulatory Visit (HOSPITAL_COMMUNITY): Payer: Self-pay

## 2022-07-17 ENCOUNTER — Other Ambulatory Visit: Payer: Self-pay | Admitting: Internal Medicine

## 2022-07-17 ENCOUNTER — Other Ambulatory Visit (HOSPITAL_COMMUNITY): Payer: Self-pay

## 2022-07-17 DIAGNOSIS — E119 Type 2 diabetes mellitus without complications: Secondary | ICD-10-CM

## 2022-07-17 DIAGNOSIS — I10 Essential (primary) hypertension: Secondary | ICD-10-CM

## 2022-07-18 ENCOUNTER — Other Ambulatory Visit (HOSPITAL_COMMUNITY): Payer: Self-pay

## 2022-07-26 ENCOUNTER — Other Ambulatory Visit (HOSPITAL_COMMUNITY): Payer: Self-pay

## 2022-07-26 ENCOUNTER — Other Ambulatory Visit: Payer: Self-pay | Admitting: Internal Medicine

## 2022-07-26 DIAGNOSIS — E119 Type 2 diabetes mellitus without complications: Secondary | ICD-10-CM

## 2022-07-26 DIAGNOSIS — I10 Essential (primary) hypertension: Secondary | ICD-10-CM

## 2022-07-28 ENCOUNTER — Other Ambulatory Visit (HOSPITAL_COMMUNITY): Payer: Self-pay

## 2022-07-29 ENCOUNTER — Ambulatory Visit (INDEPENDENT_AMBULATORY_CARE_PROVIDER_SITE_OTHER): Payer: PPO | Admitting: Sports Medicine

## 2022-07-29 ENCOUNTER — Other Ambulatory Visit: Payer: Self-pay

## 2022-07-29 ENCOUNTER — Encounter: Payer: Self-pay | Admitting: Sports Medicine

## 2022-07-29 DIAGNOSIS — G5602 Carpal tunnel syndrome, left upper limb: Secondary | ICD-10-CM

## 2022-07-29 DIAGNOSIS — G5601 Carpal tunnel syndrome, right upper limb: Secondary | ICD-10-CM

## 2022-07-29 MED ORDER — BETAMETHASONE SOD PHOS & ACET 6 (3-3) MG/ML IJ SUSP
6.0000 mg | INTRAMUSCULAR | Status: AC | PRN
Start: 2022-07-29 — End: 2022-07-29
  Administered 2022-07-29: 6 mg via INTRA_ARTICULAR

## 2022-07-29 MED ORDER — LIDOCAINE HCL 1 % IJ SOLN
2.0000 mL | INTRAMUSCULAR | Status: AC | PRN
Start: 2022-07-29 — End: 2022-07-29
  Administered 2022-07-29: 2 mL

## 2022-07-29 NOTE — Progress Notes (Signed)
Office & Procedure Note  Patient: Adam Ware, Dr.             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  He was seen back by Dr. Frazier Butt at our office back in June 2023 that had repeat carpal tunnel injections.  Has not had any since that time.  The left hand is worse than the right.  Numbness tingling in the thumb and second and third finger.  He is left-hand dominant.  PE:  -No overlying swelling, positive Tinel's test bilaterally at each carpal tunnel inlet.  There is very mild left thenar eminence atrophy and some pallor changes compared to the right side.  Procedures: Visit Diagnoses:  1. Carpal tunnel syndrome, left upper limb    Hand/UE Inj: bilateral carpal tunnel for carpal tunnel syndrome on 07/29/2022 5:24 PM Indications: pain and therapeutic Details: 25 G needle, volar approach Medications (Right): 2 mL lidocaine 1 %; 6 mg betamethasone acetate-betamethasone sodium phosphate 6 (3-3) MG/ML (2 mL of D5W) Medications (Left): 2 mL lidocaine 1 %; 6 mg betamethasone acetate-betamethasone sodium phosphate 6 (3-3) MG/ML (2 mL of D5W) Outcome: tolerated well, no immediate complications  Procedure: US-guided Carpal Tunnel Injection, Left Wrist After informed verbal consent and discussion on R/B/I, a timeout was performed, patient was seated on exam table with the affected arm placed in supine position on table. The area overlying the patient's carpal tunnel was prepped with Betadine and alcohol swabs then utilizing ultrasound guidance via an in-plane approach, the patient's carpal tunnel was injected with a mixture of 2:2:1 lidocaine:D5W:betamethasone with hydrodissection of the median nerve from the flexor retinaculum in 360 degree fashion. Visualization of the needle was achieved with a transverse, in-plane  approach. Patient tolerated procedure well without immediate complications.  Procedure: US-guided Carpal Tunnel Injection, Right Wrist After informed verbal consent and discussion on R/B/I, a timeout was performed, patient was seated on exam table with the affected arm placed in supine position on table. The area overlying the patient's carpal tunnel was prepped with Betadine and alcohol swabs then utilizing ultrasound guidance via an in-plane approach, the patient's carpal tunnel was injected with a mixture of 2:2:1 lidocaine:D5W:betamethasone with hydrodissection of the median nerve from the flexor retinaculum in 360 degree fashion. Visualization of the needle was achieved with a transverse, in-plane approach. Patient tolerated procedure well without immediate complications.  Procedure, treatment alternatives, risks and benefits explained, specific risks discussed. Consent was given by the patient. Immediately prior to procedure a time out was called to verify the correct patient, procedure, equipment, support staff and site/side marked as required. Patient was prepped and draped in the usual sterile fashion.     Plan:  -Through shared decision-making, elected to proceed with ultrasound-guided carpal tunnel injection and hydrodissection, patient tolerated well -Will avoid provocative activities of the wrist, may use ice for the next 48 hours as needed -Discussed trialing cock-up wrist splints at nighttime for nighttime bracing -If for some reason this does not give him significant relief, next steps would likely be being evaluated for possible surgical release, can always consider nerve conduction studies prior to this to grade severity but likely not needed  Madelyn Brunner, DO Primary Care Sports Medicine Physician  [facility]  This note was dictated using Dragon naturally speaking software and may contain errors in syntax, spelling, or content which have not been  identified prior to signing this note.

## 2022-07-29 NOTE — Progress Notes (Signed)
Left greater than right wrist pain Tingling sensation

## 2022-08-04 ENCOUNTER — Other Ambulatory Visit (HOSPITAL_COMMUNITY): Payer: Self-pay

## 2022-08-05 ENCOUNTER — Other Ambulatory Visit (HOSPITAL_COMMUNITY): Payer: Self-pay

## 2022-08-05 ENCOUNTER — Other Ambulatory Visit: Payer: Self-pay | Admitting: Internal Medicine

## 2022-08-05 ENCOUNTER — Other Ambulatory Visit: Payer: Self-pay

## 2022-08-05 DIAGNOSIS — E119 Type 2 diabetes mellitus without complications: Secondary | ICD-10-CM

## 2022-08-05 MED ORDER — TIRZEPATIDE 12.5 MG/0.5ML ~~LOC~~ SOAJ
12.5000 mg | SUBCUTANEOUS | 0 refills | Status: DC
Start: 2022-08-05 — End: 2022-10-21
  Filled 2022-08-05: qty 2, 28d supply, fill #0
  Filled 2022-08-25 – 2022-08-26 (×2): qty 2, 28d supply, fill #1
  Filled 2022-09-07 – 2022-09-29 (×3): qty 2, 28d supply, fill #2

## 2022-08-12 ENCOUNTER — Other Ambulatory Visit (INDEPENDENT_AMBULATORY_CARE_PROVIDER_SITE_OTHER): Payer: PPO

## 2022-08-12 DIAGNOSIS — E119 Type 2 diabetes mellitus without complications: Secondary | ICD-10-CM

## 2022-08-12 LAB — POCT GLYCOSYLATED HEMOGLOBIN (HGB A1C): Hemoglobin A1C: 6.6 % — AB (ref 4.0–5.6)

## 2022-08-12 NOTE — Progress Notes (Signed)
Order for POCT A1C placed per provider request.

## 2022-08-25 ENCOUNTER — Other Ambulatory Visit (HOSPITAL_COMMUNITY): Payer: Self-pay

## 2022-08-26 ENCOUNTER — Other Ambulatory Visit (HOSPITAL_COMMUNITY): Payer: Self-pay

## 2022-08-26 ENCOUNTER — Other Ambulatory Visit: Payer: Self-pay | Admitting: Internal Medicine

## 2022-08-26 ENCOUNTER — Other Ambulatory Visit: Payer: Self-pay

## 2022-08-26 DIAGNOSIS — I10 Essential (primary) hypertension: Secondary | ICD-10-CM

## 2022-08-26 DIAGNOSIS — E119 Type 2 diabetes mellitus without complications: Secondary | ICD-10-CM

## 2022-08-26 MED ORDER — LISINOPRIL 10 MG PO TABS
10.0000 mg | ORAL_TABLET | Freq: Every day | ORAL | 0 refills | Status: DC
Start: 2022-08-26 — End: 2022-09-07
  Filled 2022-08-26: qty 90, 90d supply, fill #0

## 2022-08-27 ENCOUNTER — Other Ambulatory Visit: Payer: Self-pay

## 2022-08-27 ENCOUNTER — Other Ambulatory Visit (HOSPITAL_COMMUNITY): Payer: Self-pay

## 2022-09-01 ENCOUNTER — Encounter: Payer: Self-pay | Admitting: Internal Medicine

## 2022-09-01 ENCOUNTER — Ambulatory Visit (INDEPENDENT_AMBULATORY_CARE_PROVIDER_SITE_OTHER): Payer: PPO | Admitting: Internal Medicine

## 2022-09-01 VITALS — BP 128/78 | HR 86 | Temp 98.3°F | Resp 16 | Ht 73.0 in | Wt 191.0 lb

## 2022-09-01 DIAGNOSIS — E1169 Type 2 diabetes mellitus with other specified complication: Secondary | ICD-10-CM | POA: Diagnosis not present

## 2022-09-01 DIAGNOSIS — E7841 Elevated Lipoprotein(a): Secondary | ICD-10-CM

## 2022-09-01 DIAGNOSIS — E781 Pure hyperglyceridemia: Secondary | ICD-10-CM

## 2022-09-01 DIAGNOSIS — E785 Hyperlipidemia, unspecified: Secondary | ICD-10-CM

## 2022-09-01 DIAGNOSIS — I10 Essential (primary) hypertension: Secondary | ICD-10-CM

## 2022-09-01 DIAGNOSIS — E119 Type 2 diabetes mellitus without complications: Secondary | ICD-10-CM

## 2022-09-01 LAB — CBC WITH DIFFERENTIAL/PLATELET
Basophils Absolute: 0 10*3/uL (ref 0.0–0.1)
Basophils Relative: 0.2 % (ref 0.0–3.0)
Eosinophils Absolute: 0.1 10*3/uL (ref 0.0–0.7)
Eosinophils Relative: 1.6 % (ref 0.0–5.0)
HCT: 41.4 % (ref 39.0–52.0)
Hemoglobin: 13.5 g/dL (ref 13.0–17.0)
Lymphocytes Relative: 45.8 % (ref 12.0–46.0)
Lymphs Abs: 2.7 10*3/uL (ref 0.7–4.0)
MCHC: 32.5 g/dL (ref 30.0–36.0)
MCV: 87.5 fl (ref 78.0–100.0)
Monocytes Absolute: 0.4 10*3/uL (ref 0.1–1.0)
Monocytes Relative: 7.3 % (ref 3.0–12.0)
Neutro Abs: 2.7 10*3/uL (ref 1.4–7.7)
Neutrophils Relative %: 45.1 % (ref 43.0–77.0)
Platelets: 263 10*3/uL (ref 150.0–400.0)
RBC: 4.74 Mil/uL (ref 4.22–5.81)
RDW: 13.4 % (ref 11.5–15.5)
WBC: 5.9 10*3/uL (ref 4.0–10.5)

## 2022-09-01 LAB — BASIC METABOLIC PANEL
BUN: 15 mg/dL (ref 6–23)
CO2: 28 mEq/L (ref 19–32)
Calcium: 9.1 mg/dL (ref 8.4–10.5)
Chloride: 105 mEq/L (ref 96–112)
Creatinine, Ser: 0.77 mg/dL (ref 0.40–1.50)
GFR: 88.35 mL/min (ref >60.00)
Glucose, Bld: 189 mg/dL — ABNORMAL HIGH (ref 70–99)
Potassium: 4.4 mEq/L (ref 3.5–5.1)
Sodium: 139 mEq/L (ref 135–145)

## 2022-09-01 LAB — LIPID PANEL
Cholesterol: 126 mg/dL (ref 0–200)
HDL: 31.9 mg/dL — ABNORMAL LOW (ref >39.00)
LDL Cholesterol: 58 mg/dL (ref 0–99)
NonHDL: 94.53
Total CHOL/HDL Ratio: 4
Triglycerides: 185 mg/dL — ABNORMAL HIGH (ref 0.0–149.0)
VLDL: 37 mg/dL (ref 0.0–40.0)

## 2022-09-01 LAB — HEPATIC FUNCTION PANEL
ALT: 25 U/L (ref 0–53)
AST: 17 U/L (ref 0–37)
Albumin: 4.1 g/dL (ref 3.5–5.2)
Alkaline Phosphatase: 87 U/L (ref 39–117)
Bilirubin, Direct: 0.2 mg/dL (ref 0.0–0.3)
Total Bilirubin: 0.9 mg/dL (ref 0.2–1.2)
Total Protein: 6.4 g/dL (ref 6.0–8.3)

## 2022-09-01 NOTE — Patient Instructions (Signed)

## 2022-09-01 NOTE — Progress Notes (Signed)
Subjective:  Patient ID: Adam Ware, Dr., male    DOB: 10/24/1950  Age: 72 y.o. MRN: [id number]  CC: Hypertension, Hyperlipidemia, and Diabetes   HPI Adam Ware, Dr. presents for f/up ---  Discussed the use of AI scribe software for clinical note transcription with the patient, who gave verbal consent to proceed.  History of Present Illness   The patient presents with no new complaints. He reports staying active, including playing golf, with no associated chest pain, shortness of breath, dizziness, or lightheadedness. He denies any gastrointestinal symptoms.   The patient has a history of coronary calcium scoring, with a previous score of 70. He denies any symptoms that would suggest a need for a repeat EKG. He has been monitoring his weight and reports a recent loss, now weighing approximately 192 pounds.  He is on an ACE inhibitor, with no associated coughing or wheezing. He denies any symptoms of thyroid dysfunction. He recently had an A1c test, which returned a result of 6.6. He denies any other symptoms or health concerns.       Outpatient Medications Prior to Visit  Medication Sig Dispense Refill   atorvastatin (LIPITOR) 10 MG tablet Take 1 tablet (10 mg total) by mouth 3 (three) times a week. (Patient taking differently: Take 10 mg by mouth every Monday, Wednesday, and Friday.) 90 tablet 1   finasteride (PROSCAR) 5 MG tablet Take 1 tablet (5 mg total) by mouth daily. (Patient taking differently: Take 1.25 mg by mouth daily.) 90 tablet 1   lisinopril (ZESTRIL) 10 MG tablet Take 1 tablet by mouth daily. 90 tablet 0   metFORMIN (GLUCOPHAGE-XR) 500 MG 24 hr tablet Take 1 tablet (500 mg total) by mouth daily with breakfast. 90 tablet 0   tadalafil (CIALIS) 20 MG tablet Take 1 tablet (20 mg total) by mouth daily as needed for erectile dysfunction. 30 tablet 1   tirzepatide (MOUNJARO) 12.5 MG/0.5ML Pen Inject 12.5 mg into the skin once a week. 6 mL 0   zolpidem (AMBIEN CR) 6.25  MG CR tablet Take 1 tablet (6.25 mg total) by mouth at bedtime as needed for sleep. 90 tablet 1   No facility-administered medications prior to visit.    ROS Review of Systems  Constitutional: Negative.  Negative for chills, diaphoresis, fatigue and fever.  HENT: Negative.    Eyes: Negative.   Respiratory:  Negative for chest tightness, shortness of breath and wheezing.   Cardiovascular:  Negative for chest pain, palpitations and leg swelling.  Gastrointestinal:  Negative for abdominal pain, constipation, diarrhea, nausea and vomiting.  Endocrine: Negative.   Genitourinary: Negative.  Negative for difficulty urinating.  Musculoskeletal:  Positive for arthralgias. Negative for myalgias.  Skin: Negative.   Neurological:  Negative for dizziness and weakness.  Hematological:  Negative for adenopathy.  Psychiatric/Behavioral:  Positive for sleep disturbance. Negative for dysphoric mood. The patient is not nervous/anxious.     Objective:  BP 128/78 (BP Location: Right Arm, Patient Position: Sitting, Cuff Size: Large)   Pulse 86   Temp 98.3 F (36.8 C) (Oral)   Resp 16   Ht 6\' 1"  (1.854 m)   Wt 191 lb (86.6 kg)   SpO2 94%   BMI 25.20 kg/m   BP Readings from Last 3 Encounters:  09/01/22 128/78  02/24/22 (!) 151/71  01/24/22 131/75    Wt Readings from Last 3 Encounters:  09/01/22 191 lb (86.6 kg)  01/23/22 201 lb 15.1 oz (91.6 kg)  12/11/21 202 lb (91.6  kg)    Physical Exam Vitals reviewed.  Constitutional:      Appearance: Normal appearance.  HENT:     Nose: Nose normal.     Mouth/Throat:     Mouth: Mucous membranes are moist.  Eyes:     General: No scleral icterus.    Conjunctiva/sclera: Conjunctivae normal.  Cardiovascular:     Rate and Rhythm: Normal rate and regular rhythm.     Pulses: Normal pulses.     Heart sounds: No murmur heard.    No gallop.  Pulmonary:     Effort: Pulmonary effort is normal.     Breath sounds: No stridor. No wheezing, rhonchi or  rales.  Abdominal:     General: Abdomen is flat.     Palpations: There is no mass.     Tenderness: There is no abdominal tenderness. There is no guarding.     Hernia: No hernia is present.  Musculoskeletal:        General: Normal range of motion.     Cervical back: Neck supple.     Right lower leg: No edema.     Left lower leg: No edema.  Lymphadenopathy:     Cervical: No cervical adenopathy.  Skin:    General: Skin is warm and dry.  Neurological:     General: No focal deficit present.     Mental Status: He is alert.  Psychiatric:        Mood and Affect: Mood normal.        Behavior: Behavior normal.     Lab Results  Component Value Date   WBC 5.9 09/01/2022   HGB 13.5 09/01/2022   HCT 41.4 09/01/2022   PLT 263.0 09/01/2022   GLUCOSE 189 (H) 09/01/2022   CHOL 126 09/01/2022   TRIG 185.0 (H) 09/01/2022   HDL 31.90 (L) 09/01/2022   LDLDIRECT 59.0 05/05/2018   LDLCALC 58 09/01/2022   ALT 25 09/01/2022   AST 17 09/01/2022   NA 139 09/01/2022   K 4.4 09/01/2022   CL 105 09/01/2022   CREATININE 0.77 09/01/2022   BUN 15 09/01/2022   CO2 28 09/01/2022   TSH 1.70 05/21/2021   PSA 0.31 12/11/2021   INR 1.05 04/20/2017   HGBA1C 6.6 (A) 08/12/2022   MICROALBUR 1.1 12/11/2021    No results found.  Assessment & Plan:   Hyperlipidemia LDL goal <100- LDL goal achieved. Doing well on the statin  -     Lipoprotein A (LPA); Future -     Lipid panel; Future -     Hepatic function panel; Future  Hyperlipidemia associated with type 2 diabetes mellitus (HCC) -     Lipoprotein A (LPA); Future -     Lipid panel; Future  Pure hyperglyceridemia -     Lipid panel; Future -     Hepatic function panel; Future  Type 2 diabetes mellitus without complication, without long-term current use of insulin (HCC)- His blood sugar is well controlled.  Essential hypertension - His BP is well controlled. -     Basic metabolic panel; Future -     CBC with Differential/Platelet;  Future  High serum lipoprotein(a)     Follow-up: Return in about 6 months (around 03/04/2023).  Sanda Linger, MD

## 2022-09-05 ENCOUNTER — Other Ambulatory Visit: Payer: Self-pay | Admitting: Internal Medicine

## 2022-09-05 DIAGNOSIS — E7841 Elevated Lipoprotein(a): Secondary | ICD-10-CM | POA: Insufficient documentation

## 2022-09-05 LAB — LIPOPROTEIN A (LPA): Lipoprotein (a): 256 nmol/L — ABNORMAL HIGH (ref <75)

## 2022-09-07 ENCOUNTER — Other Ambulatory Visit: Payer: Self-pay | Admitting: Internal Medicine

## 2022-09-07 DIAGNOSIS — E119 Type 2 diabetes mellitus without complications: Secondary | ICD-10-CM

## 2022-09-07 DIAGNOSIS — I10 Essential (primary) hypertension: Secondary | ICD-10-CM

## 2022-09-07 MED ORDER — LISINOPRIL 10 MG PO TABS
10.0000 mg | ORAL_TABLET | Freq: Every day | ORAL | 1 refills | Status: DC
Start: 2022-09-07 — End: 2023-05-13
  Filled 2022-09-07 – 2022-11-19 (×2): qty 90, 90d supply, fill #0
  Filled 2022-11-30 – 2023-02-18 (×2): qty 90, 90d supply, fill #1

## 2022-09-07 MED ORDER — METFORMIN HCL ER 500 MG PO TB24
500.0000 mg | ORAL_TABLET | Freq: Every day | ORAL | 1 refills | Status: DC
Start: 2022-09-07 — End: 2023-03-02
  Filled 2022-09-07: qty 90, 90d supply, fill #0
  Filled 2022-12-08: qty 90, 90d supply, fill #1

## 2022-09-08 ENCOUNTER — Other Ambulatory Visit: Payer: Self-pay

## 2022-09-08 ENCOUNTER — Ambulatory Visit (INDEPENDENT_AMBULATORY_CARE_PROVIDER_SITE_OTHER): Payer: PPO | Admitting: Sports Medicine

## 2022-09-08 ENCOUNTER — Other Ambulatory Visit (HOSPITAL_COMMUNITY): Payer: Self-pay

## 2022-09-08 DIAGNOSIS — G5602 Carpal tunnel syndrome, left upper limb: Secondary | ICD-10-CM | POA: Diagnosis not present

## 2022-09-08 NOTE — Progress Notes (Addendum)
   Procedure Note  Patient: Adam Ware, Dr.             Date of Birth: 1951-01-01           MRN: [id number]             Visit Date: 09/08/2022  Procedures: Visit Diagnoses:  1. Carpal tunnel syndrome, left upper limb    Procedure: US-guided Carpal Tunnel Injection, Left Wrist After informed verbal consent and discussion on R/B/I, a timeout was performed, patient was seated on exam table with the affected arm placed in supine position on table. The area overlying the patient's carpal tunnel was prepped with Chloraprep and alcohol swabs then utilizing ultrasound guidance via an in-plane approach, the patient's carpal tunnel was injected with a mixture of 2:2:1 lidocaine:D5W:betamethasone with hydrodissection of the median nerve from the flexor retinaculum in 360 degree fashion. Visualization of the needle was achieved with a transverse, in-plane approach. Patient tolerated procedure well without immediate complications.    -Patient had some residual tingling after the injection but full range of motion -Continue modified rest for the next 48 hours, may use cock-up wrist braces at nighttime as needed -We will see over the coming 2-4 weeks the degree of improvement, is not getting further improvement or lasting benefit, next step would likely be referral for surgical release  Madelyn Brunner, DO Primary Care Sports Medicine Physician  [facility]  This note was dictated using Dragon naturally speaking software and may contain errors in syntax, spelling, or content which have not been identified prior to signing this note.

## 2022-09-22 ENCOUNTER — Other Ambulatory Visit (INDEPENDENT_AMBULATORY_CARE_PROVIDER_SITE_OTHER): Payer: PPO

## 2022-09-22 ENCOUNTER — Ambulatory Visit: Payer: PPO | Admitting: Orthopedic Surgery

## 2022-09-22 ENCOUNTER — Other Ambulatory Visit (HOSPITAL_COMMUNITY): Payer: Self-pay

## 2022-09-22 ENCOUNTER — Other Ambulatory Visit: Payer: Self-pay

## 2022-09-22 DIAGNOSIS — G5602 Carpal tunnel syndrome, left upper limb: Secondary | ICD-10-CM

## 2022-09-22 NOTE — Progress Notes (Signed)
Visit Reason: Left wrist CT Hand dominance: left Occupation: Physician Diabetic: No   Prior Testing: None Injections: 09/08/22- minimal relief Treatments: Injection Prior Surgery: None     Adam Ware, Dr. - 72 y.o. 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  He most recently underwent injection on July 22 of this year, with minimal relief.  At this juncture, he is interested in discussing surgical intervention.  Pertinent ROS were reviewed with the patient and found to be negative unless otherwise specified above in HPI.   Assessment & Plan: Visit Diagnoses:  1. Carpal tunnel syndrome, left upper limb     Plan: Extensive discussion was had with patient today regarding his ongoing left-sided carpal tunnel syndrome that was corrected to conservative care.  His clinical evidence is convincing, he is no longer responding to conservative treatments in the form of injections.  We did discuss the possibility for obtaining electrodiagnostic studies, however given his clinical symptoms, I do feel it is appropriate to move forward with left carpal tunnel release.  We discussed endoscopic vs open procedure, understanding all risk and benefits, he would like to proceed with left open carpal tunnel release under local anesthesia.  Will move forward with surgical scheduling.  Patient is indicated for left open carpal tunnel release under local anesthesia.  Risks and benefits of the procedure were discussed, risks including but not limited to infection, bleeding, scarring, stiffness, nerve  injury, vascular injury, hardware complication, recurrence of symptoms and need for subsequent operation.  Patient expressed understanding.      Follow-up: No follow-ups on file.   Meds & Orders: No orders of the defined types were placed in this encounter.   Orders Placed This Encounter  Procedures   XR Wrist Complete Left     Procedures: No procedures performed      Clinical History: MRI LUMBAR SPINE WITHOUT CONTRAST   TECHNIQUE:  Multiplanar, multisequence MR imaging of the lumbar spine was  performed. No intravenous contrast was administered.   COMPARISON: Lumbar spine radiographs 04/21/2017. CT abdomen and  pelvis 03/25/2014.   FINDINGS:  Segmentation: Standard.   Alignment: 8 mm anterolisthesis of L5 on S1 due to bilateral L5 pars  defects.   Vertebrae: No acute fracture or suspicious osseous lesion. Mild  left-sided L4 inferior endplate edema, likely degenerative with a  small Schmorl's node also present in this location.   Conus medullaris and cauda equina: Conus extends to the L1-2 level.  Conus and cauda equina appear normal.   Paraspinal and other soft tissues: Unremarkable.   Disc levels:   L1-2: Negative.   L2-3: Mild disc desiccation. Minimal disc bulging without stenosis.   L3-4: Mild disc desiccation. Minimal disc bulging and mild facet  arthrosis without stenosis.   L4-5: Mild disc desiccation. Mild disc bulging and moderate facet  hypertrophy result in mild left neural foraminal stenosis without  spinal stenosis. A 5 x 1 mm cyst is noted along the left ligamentum  flavum, not resulting in neural impingement.   L5-S1: Disc desiccation and severe disc space narrowing.  Anterolisthesis, disc  space height loss, and bulging uncovered disc  result in severe bilateral neural foraminal stenosis with L5 nerve  root compression. No spinal stenosis.   IMPRESSION:  1. Bilateral L5 pars defects with grade 1 anterolisthesis, advanced  disc  degeneration, and severe bilateral neural foraminal stenosis  with L5 nerve root compression.  2. Mild disc bulging and moderate facet hypertrophy at L4-5  resulting in mild left neural foraminal stenosis.    Electronically Signed  By: Sebastian Ache M.D.  On: 05/01/2017 17:09  He reports that he has never smoked. He has never been exposed to tobacco smoke. He has never used smokeless tobacco.  Recent Labs    12/09/21 0000 08/12/22 1715  HGBA1C 6.6 6.6*    Objective:   Vital Signs: There were no vitals taken for this visit.  Physical Exam  Gen: Well-appearing, in no acute distress; non-toxic CV: Regular Rate. Well-perfused. Warm.  Resp: Breathing unlabored on room air; no wheezing. Psych: Fluid speech in conversation; appropriate affect; normal thought process Neuro: Sensation intact throughout. No gross coordination deficits.   Ortho Exam Left upper extremity:  Thenar atrophy: None Tinel sign at wrist: Positive Carpal tunnel compression: Positive Phalen test: Positive  Sensory 2-point discrimination (thumb, index, middle) 5 to 6 mm  Motor FPL, FDP (2-5): 5/5 APB: 5/5  Cervical Spine: ROM is normal/limited Negative Spurling's sign   Imaging: XR Wrist Complete Left  Result Date: 09/22/2022 3 views of the left wrist were obtained today X-rays demonstrate well located radiocarpal joint in all planes, there is mild degenerative change noted at the radiocarpal interface, slight ulnar positive variance.  Thumb CMC joint with mild degenerative changes well, subchondral sclerosis and slight subluxation.   Past Medical/Family/Surgical/Social History: Medications & Allergies reviewed per EMR, new medications updated. Patient Active Problem List   Diagnosis Date Noted   High serum lipoprotein(a) 09/05/2022   Encounter for general adult medical examination with abnormal findings 06/12/2021   Carpal tunnel syndrome, left upper limb 11/28/2020   Carpal tunnel syndrome, right  upper limb 11/28/2020   Erectile dysfunction due to arterial insufficiency 09/24/2020   Benign prostatic hyperplasia with weak urinary stream 05/05/2018   Hyperlipidemia LDL goal <100 05/05/2018   Pure hyperglyceridemia 05/05/2018   Psychophysiological insomnia 05/05/2018   Lumbar radiculopathy 07/15/2017   Hyperlipidemia associated with type 2 diabetes mellitus (HCC) 06/10/2017   Essential hypertension 06/10/2017   Type 2 diabetes mellitus without complication, without long-term current use of insulin (HCC) 06/10/2017   Spondylolisthesis, lumbar region 04/21/2017   Past Medical History:  Diagnosis Date   Arthritis    Diabetes mellitus without complication (HCC)    type 2; tx metformin and wkly ozempic on Sundays   Hair loss    History of kidney stones    surgery to remove   Hyperlipidemia    tx w/atorvastatin   Hypertension    tx w/lisinopril   Insomnia    tx w/ambien   Family History  Problem Relation Age of Onset   Cancer Mother    Diabetes Mother    Mental illness Mother    Heart disease Father    Arthritis Brother    Past Surgical History:  Procedure Laterality Date   APPENDECTOMY     COLONOSCOPY     I & D KNEE WITH POLY EXCHANGE Right 01/24/2022   Procedure: IRRIGATION AND DEBRIDEMENT RIGHT KNEE WITH POLY EXCHANGE;  Surgeon: Nadara Mustard, MD;  Location: [facility] OR;  Service: Orthopedics;  Laterality: Right;   LITHOTRIPSY  REPLACEMENT TOTAL KNEE Right    TOTAL KNEE REVISION Right 04/22/2017   Procedure: REVISION RIGHT TOTAL KNEE ARTHROPLASTY VS. POLY EXCHANGE;  Surgeon: Nadara Mustard, MD;  Location: [facility] OR;  Service: Orthopedics;  Laterality: Right;   Social History   Occupational History   Not on file  Tobacco Use   Smoking status: Never    Passive exposure: Never   Smokeless tobacco: Never  Vaping Use   Vaping status: Never Used  Substance and Sexual Activity   Alcohol use: Yes    Alcohol/week: 7.0 standard drinks of alcohol    Types: 7 Glasses of wine  per week    Comment: wine   Drug use: No   Sexual activity: Not Currently    Partners: Male     Fara Boros) Denese Killings, M.D. [facility] 4:31 PM

## 2022-09-29 ENCOUNTER — Other Ambulatory Visit: Payer: Self-pay | Admitting: Internal Medicine

## 2022-09-29 ENCOUNTER — Other Ambulatory Visit (HOSPITAL_COMMUNITY): Payer: Self-pay

## 2022-09-29 DIAGNOSIS — E119 Type 2 diabetes mellitus without complications: Secondary | ICD-10-CM

## 2022-10-21 ENCOUNTER — Other Ambulatory Visit: Payer: Self-pay

## 2022-10-21 ENCOUNTER — Other Ambulatory Visit (HOSPITAL_COMMUNITY): Payer: Self-pay

## 2022-10-21 ENCOUNTER — Other Ambulatory Visit: Payer: Self-pay | Admitting: Internal Medicine

## 2022-10-21 DIAGNOSIS — E119 Type 2 diabetes mellitus without complications: Secondary | ICD-10-CM

## 2022-10-21 MED ORDER — MOUNJARO 12.5 MG/0.5ML ~~LOC~~ SOAJ
12.5000 mg | SUBCUTANEOUS | 1 refills | Status: DC
  Filled 2022-10-21 – 2022-10-24 (×2): qty 6, 84d supply, fill #0

## 2022-10-22 ENCOUNTER — Other Ambulatory Visit (HOSPITAL_COMMUNITY): Payer: Self-pay

## 2022-10-24 ENCOUNTER — Other Ambulatory Visit (HOSPITAL_COMMUNITY): Payer: Self-pay

## 2022-10-24 ENCOUNTER — Other Ambulatory Visit: Payer: Self-pay | Admitting: Internal Medicine

## 2022-10-24 DIAGNOSIS — E119 Type 2 diabetes mellitus without complications: Secondary | ICD-10-CM

## 2022-10-25 ENCOUNTER — Other Ambulatory Visit: Payer: Self-pay | Admitting: Internal Medicine

## 2022-10-25 ENCOUNTER — Other Ambulatory Visit (HOSPITAL_COMMUNITY): Payer: Self-pay

## 2022-10-25 DIAGNOSIS — E119 Type 2 diabetes mellitus without complications: Secondary | ICD-10-CM

## 2022-10-25 MED ORDER — TIRZEPATIDE 15 MG/0.5ML ~~LOC~~ SOAJ
15.0000 mg | SUBCUTANEOUS | 0 refills | Status: DC
Start: 2022-10-25 — End: 2023-01-21
  Filled 2022-10-25: qty 6, 84d supply, fill #0
  Filled 2022-10-27: qty 2, 28d supply, fill #0
  Filled 2022-11-19: qty 2, 28d supply, fill #1
  Filled 2022-11-30 – 2022-12-10 (×3): qty 2, 28d supply, fill #2

## 2022-10-27 ENCOUNTER — Other Ambulatory Visit (HOSPITAL_COMMUNITY): Payer: Self-pay

## 2022-10-31 ENCOUNTER — Other Ambulatory Visit: Payer: Self-pay

## 2022-10-31 ENCOUNTER — Encounter (HOSPITAL_BASED_OUTPATIENT_CLINIC_OR_DEPARTMENT_OTHER): Payer: Self-pay | Admitting: Orthopedic Surgery

## 2022-11-06 ENCOUNTER — Encounter (HOSPITAL_BASED_OUTPATIENT_CLINIC_OR_DEPARTMENT_OTHER): Payer: Self-pay | Admitting: Certified Registered"

## 2022-11-06 ENCOUNTER — Encounter (HOSPITAL_BASED_OUTPATIENT_CLINIC_OR_DEPARTMENT_OTHER): Payer: Self-pay | Admitting: Orthopedic Surgery

## 2022-11-07 ENCOUNTER — Encounter (HOSPITAL_BASED_OUTPATIENT_CLINIC_OR_DEPARTMENT_OTHER)
Admission: RE | Disposition: A | Discharge status: Home / Self Care | Payer: Self-pay | Source: Home / Self Care | Attending: Orthopedic Surgery

## 2022-11-07 ENCOUNTER — Other Ambulatory Visit (HOSPITAL_COMMUNITY): Payer: Self-pay

## 2022-11-07 ENCOUNTER — Encounter (HOSPITAL_BASED_OUTPATIENT_CLINIC_OR_DEPARTMENT_OTHER): Payer: Self-pay | Admitting: Orthopedic Surgery

## 2022-11-07 ENCOUNTER — Ambulatory Visit (HOSPITAL_BASED_OUTPATIENT_CLINIC_OR_DEPARTMENT_OTHER)
Admission: RE | Admit: 2022-11-07 | Discharge: 2022-11-07 | Disposition: A | Discharge status: Home / Self Care | Payer: PPO | Attending: Orthopedic Surgery | Admitting: Orthopedic Surgery

## 2022-11-07 DIAGNOSIS — Z833 Family history of diabetes mellitus: Secondary | ICD-10-CM | POA: Insufficient documentation

## 2022-11-07 DIAGNOSIS — I10 Essential (primary) hypertension: Secondary | ICD-10-CM | POA: Insufficient documentation

## 2022-11-07 DIAGNOSIS — M5136 Other intervertebral disc degeneration, lumbar region: Secondary | ICD-10-CM | POA: Diagnosis not present

## 2022-11-07 DIAGNOSIS — N400 Enlarged prostate without lower urinary tract symptoms: Secondary | ICD-10-CM | POA: Insufficient documentation

## 2022-11-07 DIAGNOSIS — G5603 Carpal tunnel syndrome, bilateral upper limbs: Secondary | ICD-10-CM | POA: Diagnosis not present

## 2022-11-07 DIAGNOSIS — E785 Hyperlipidemia, unspecified: Secondary | ICD-10-CM | POA: Diagnosis not present

## 2022-11-07 DIAGNOSIS — G5602 Carpal tunnel syndrome, left upper limb: Secondary | ICD-10-CM

## 2022-11-07 DIAGNOSIS — E119 Type 2 diabetes mellitus without complications: Secondary | ICD-10-CM | POA: Diagnosis not present

## 2022-11-07 HISTORY — PX: MINOR CARPAL TUNNEL: SHX6472

## 2022-11-07 SURGERY — MINOR CARPAL TUNNEL
Anesthesia: LOCAL | Site: Wrist | Laterality: Left

## 2022-11-07 MED ORDER — 0.9 % SODIUM CHLORIDE (POUR BTL) OPTIME
TOPICAL | Status: DC | PRN
  Administered 2022-11-07: 250 mL

## 2022-11-07 MED ORDER — LIDOCAINE-EPINEPHRINE 1 %-1:100000 IJ SOLN
INTRAMUSCULAR | Status: DC | PRN
Start: 2022-11-07 — End: 2022-11-07
  Administered 2022-11-07: 10 mL

## 2022-11-07 MED ORDER — CEFAZOLIN SODIUM-DEXTROSE 2-4 GM/100ML-% IV SOLN
INTRAVENOUS | Status: AC
  Filled 2022-11-07: qty 100

## 2022-11-07 MED ORDER — CEFAZOLIN SODIUM-DEXTROSE 2-4 GM/100ML-% IV SOLN: 2.0000 g | INTRAVENOUS | Status: DC

## 2022-11-07 MED ORDER — LACTATED RINGERS IV SOLN: INTRAVENOUS | Status: DC

## 2022-11-07 MED ORDER — CEPHALEXIN 250 MG/5ML PO SUSR: 250.0000 mg | Freq: Four times a day (QID) | ORAL | Status: DC

## 2022-11-07 SURGICAL SUPPLY — 19 items
APL PRP STRL LF DISP 70% ISPRP (MISCELLANEOUS) ×1
BLADE SURG 15 STRL LF DISP TIS (BLADE) IMPLANT
BLADE SURG 15 STRL SS (BLADE) ×2
BNDG CMPR 9X4 STRL LF SNTH (GAUZE/BANDAGES/DRESSINGS) ×1
BNDG ESMARK 4X9 LF (GAUZE/BANDAGES/DRESSINGS) IMPLANT
BNDG GAUZE DERMACEA FLUFF 4 (GAUZE/BANDAGES/DRESSINGS) IMPLANT
BNDG GZE DERMACEA 4 6PLY (GAUZE/BANDAGES/DRESSINGS) ×1
CHLORAPREP W/TINT 26 (MISCELLANEOUS) IMPLANT
CORD BIPOLAR FORCEPS 12FT (ELECTRODE) IMPLANT
COVER BACK TABLE 60X90IN (DRAPES) IMPLANT
GAUZE SPONGE 4X4 12PLY STRL (GAUZE/BANDAGES/DRESSINGS) IMPLANT
GOWN STRL REUS W/ TWL LRG LVL3 (GOWN DISPOSABLE) IMPLANT
GOWN STRL REUS W/TWL LRG LVL3 (GOWN DISPOSABLE) ×2
KIT BASIN OR (CUSTOM PROCEDURE TRAY) IMPLANT
NDL HYPO 25X5/8 SAFETYGLIDE (NEEDLE) IMPLANT
NEEDLE HYPO 25X5/8 SAFETYGLIDE (NEEDLE) ×1
SUT ETHILON 4 0 PS 2 18 (SUTURE) IMPLANT
SYR 10ML LL (SYRINGE) IMPLANT
SYR BULB EAR ULCER 2OZ BL STRL (SYRINGE) IMPLANT

## 2022-11-07 NOTE — H&P (Signed)
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 provider found  Subjective: No chief complaint on file.  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 male with ongoing left carpal tunnel syndrome that is refractory to conservative care in the form of bracing, activity modification and prior injections.  He most recently underwent injection on July 22 of this year, with minimal relief.  He is here today for surgical intervention in the form of left open carpal tunnel release.  Pertinent ROS were reviewed with the patient and found to be negative unless otherwise specified above in HPI.   Assessment & Plan: Visit Diagnoses:  1. Carpal tunnel syndrome, left upper limb     Plan:  Patient is indicated for left open carpal tunnel release under local anesthesia.  Risks and benefits of the procedure were discussed, risks including but not limited to infection, bleeding, scarring, stiffness, nerve injury, vascular injury, hardware complication, recurrence of symptoms and need for subsequent operation.  Patient expressed understanding.      Follow-up: No follow-ups on file.   Meds & Orders: No orders of the defined types were placed in this encounter.   No orders of the defined types were placed in this encounter.    Procedures: No procedures performed      Clinical History: MRI LUMBAR SPINE WITHOUT CONTRAST   TECHNIQUE:  Multiplanar, multisequence MR imaging of the lumbar spine was  performed. No intravenous contrast was administered.   COMPARISON: Lumbar spine radiographs 04/21/2017. CT abdomen and  pelvis 03/25/2014.   FINDINGS:  Segmentation: Standard.   Alignment: 8 mm anterolisthesis of L5 on S1 due to bilateral L5 pars  defects.   Vertebrae: No acute fracture or suspicious osseous lesion. Mild  left-sided L4 inferior endplate edema, likely  degenerative with a  small Schmorl's node also present in this location.   Conus medullaris and cauda equina: Conus extends to the L1-2 level.  Conus and cauda equina appear normal.   Paraspinal and other soft tissues: Unremarkable.   Disc levels:   L1-2: Negative.   L2-3: Mild disc desiccation. Minimal disc bulging without stenosis.   L3-4: Mild disc desiccation. Minimal disc bulging and mild facet  arthrosis without stenosis.   L4-5: Mild disc desiccation. Mild disc bulging and moderate facet  hypertrophy result in mild left neural foraminal stenosis without  spinal stenosis. A 5 x 1 mm cyst is noted along the left ligamentum  flavum, not resulting in neural impingement.   L5-S1: Disc desiccation and severe disc space narrowing.  Anterolisthesis, disc space height loss, and bulging uncovered disc  result in severe bilateral neural foraminal stenosis with L5 nerve  root compression. No spinal stenosis.   IMPRESSION:  1. Bilateral L5 pars defects with grade 1 anterolisthesis, advanced  disc degeneration, and severe bilateral neural foraminal stenosis  with L5 nerve root compression.  2. Mild disc bulging and moderate facet hypertrophy at L4-5  resulting in mild left neural foraminal stenosis.    Electronically Signed  By: Adam Ware M.D.  On: 05/01/2017 17:09  He reports that he has never smoked. He has never been exposed to tobacco smoke. He has never used smokeless tobacco.  Recent Labs    12/09/21 0000 08/12/22 1715  HGBA1C 6.6 6.6*    Objective:   Vital Signs: Ht 6\' 1"  (1.854 m)   Wt  88.5 kg   BMI 25.73 kg/m   Physical Exam  Gen: Well-appearing, in no acute distress; non-toxic CV: Regular Rate. Well-perfused. Warm.  Resp: Breathing unlabored on room air; no wheezing. Psych: Fluid speech in conversation; appropriate affect; normal thought process Neuro: Sensation intact throughout. No gross coordination deficits.   Ortho Exam Left upper  extremity:  Thenar atrophy: None Tinel sign at wrist: Positive Carpal tunnel compression: Positive Phalen test: Positive  Sensory 2-point discrimination (thumb, index, middle) 5 to 6 mm  Motor FPL, FDP (2-5): 5/5 APB: 5/5  Cervical Spine: ROM is normal/limited Negative Spurling's sign   Imaging: No results found.  Past Medical/Family/Surgical/Social History: Medications & Allergies reviewed per EMR, new medications updated. Patient Active Problem List   Diagnosis Date Noted   High serum lipoprotein(a) 09/05/2022   Encounter for general adult medical examination with abnormal findings 06/12/2021   Carpal tunnel syndrome, left upper limb 11/28/2020   Carpal tunnel syndrome, right upper limb 11/28/2020   Erectile dysfunction due to arterial insufficiency 09/24/2020   Benign prostatic hyperplasia with weak urinary stream 05/05/2018   Hyperlipidemia LDL goal <100 05/05/2018   Pure hyperglyceridemia 05/05/2018   Psychophysiological insomnia 05/05/2018   Lumbar radiculopathy 07/15/2017   Hyperlipidemia associated with type 2 diabetes mellitus (HCC) 06/10/2017   Essential hypertension 06/10/2017   Type 2 diabetes mellitus without complication, without long-term current use of insulin (HCC) 06/10/2017   Spondylolisthesis, lumbar region 04/21/2017   Past Medical History:  Diagnosis Date   Arthritis    Diabetes mellitus without complication (HCC)    type 2; tx metformin and wkly ozempic on Sundays   Hair loss    History of kidney stones    surgery to remove   Hyperlipidemia    tx w/atorvastatin   Hypertension    tx w/lisinopril   Insomnia    tx w/ambien   Family History  Problem Relation Age of Onset   Cancer Mother    Diabetes Mother    Mental illness Mother    Heart disease Father    Arthritis Brother    Past Surgical History:  Procedure Laterality Date   APPENDECTOMY     COLONOSCOPY     I & D KNEE WITH POLY EXCHANGE Right 01/24/2022   Procedure: IRRIGATION  AND DEBRIDEMENT RIGHT KNEE WITH POLY EXCHANGE;  Surgeon: Nadara Mustard, MD;  Location: [facility] OR;  Service: Orthopedics;  Laterality: Right;   LITHOTRIPSY     REPLACEMENT TOTAL KNEE Right    TOTAL KNEE REVISION Right 04/22/2017   Procedure: REVISION RIGHT TOTAL KNEE ARTHROPLASTY VS. POLY EXCHANGE;  Surgeon: Nadara Mustard, MD;  Location: [facility] OR;  Service: Orthopedics;  Laterality: Right;   Social History   Occupational History   Not on file  Tobacco Use   Smoking status: Never    Passive exposure: Never   Smokeless tobacco: Never  Vaping Use   Vaping status: Never Used  Substance and Sexual Activity   Alcohol use: Yes    Alcohol/week: 7.0 standard drinks of alcohol    Types: 7 Glasses of wine per week    Comment: wine   Drug use: No   Sexual activity: Not Currently    Partners: Male    Vinson Tietze Fara Boros) Denese Killings, M.D. [facility] 10:32 AM

## 2022-11-07 NOTE — Op Note (Signed)
NAME: Adam Ware, Dr. MEDICAL RECORD NO: [id number] DATE OF BIRTH: 1951-02-07 FACILITY: [facility] LOCATION: [facility] PHYSICIAN: Samuella Cota, MD   OPERATIVE REPORT   DATE OF PROCEDURE: 11/07/22    PREOPERATIVE DIAGNOSIS: Left carpal tunnel syndrome   POSTOPERATIVE DIAGNOSIS: Left carpal tunnel syndrome   PROCEDURE: Left open carpal tunnel release   SURGEON:  Samuella Cota, M.D.   ASSISTANT: None   ANESTHESIA:  Local   INTRAVENOUS FLUIDS:  Per anesthesia flow sheet.   ESTIMATED BLOOD LOSS:  Minimal.   COMPLICATIONS:  None.   SPECIMENS:  none   TOURNIQUET TIME:    Total Tourniquet Time Documented: Upper Arm (Left) - 8 minutes Total: Upper Arm (Left) - 8 minutes    DISPOSITION:  Stable to PACU.   INDICATIONS: This is a 72 year old male who was seen in the outpatient setting and found to have clinical and electrodiagnostic evidence of left-sided carpal tunnel syndrome that was refractory to conservative care.  After discussion, patient was indicated for left open versus endoscopic carpal tunnel release.  Understanding risk and benefits, patient elected to have surgery in the form of left open carpal tunnel release under local anesthesia.  Risks and benefits of surgery were discussed including the risks of infection, bleeding, scarring, stiffness, nerve injury, vascular injury, tendon injury, need for subsequent operation, , recurrence.  He voiced understanding of these risks and elected to proceed.  OPERATIVE COURSE: Patient was seen and identified in the preoperative area and marked appropriately.  Surgical consent had been signed. He was transferred to the operating room and placed in supine position with the Left upper extremity on an arm board.  Left upper extremity was prepped and draped in normal sterile orthopedic fashion.  A surgical pause was performed between the surgeons, anesthesia, and operating room staff and all were in agreement as to  the patient, procedure, and site of procedure.  Tourniquet was placed and padded appropriately to the left upper arm.  Arm was exsanguinated the tourniquet was inflated 250 mmHg.  Longitudinal incision was designed in the thenar crease in line with the radial border of the ring finger down to level of the distal wrist crease. Incision was carried down utilizing 15 blade. Blunt dissection was performed, palmar fascia was identified and incised sharply utilizing a Beaver blade. Careful dissection was performed down and the transcarpal ligament was identified. A Beaver blade was then utilized to divide the transcarpal ligament in a distal to proximal fashion. At the level of the wrist crease, skin flaps were elevated to allow for release of the proximal transverse carpal ligament as well as the antebrachial fascia into the forearm. Appropriate decompression was noted of the median nerve, care was taken to protect the nerve in its entirety throughout. Once we were satisfied with our proximal and distal dissection, tourniquet was deflated and bipolar electrocautery was utilized for hemostasis. Copious irrigation was performed followed by closure utilizing 4-0 nylon in standard fashion. Sterile dressings were applied followed by a loosefitting soft hand dressing. Patient was subsequently transported to the postop recovery unit in stable condition.      Samuella Cota, MD Electronically signed, 11/07/22

## 2022-11-07 NOTE — Discharge Instructions (Signed)
Hand Surgery Postop Instructions  Dressings: Maintain postoperative dressing for 5 days.   After 5 days, it is okay to unwrap postoperative dressing and apply Band-Aid or rewrap.   Keep operative site clean and dry until orthopedic follow-up.  Wound Care: Keep your hand elevated above the level of your heart.  Do not allow it to dangle by your side. Moving your fingers is advised to stimulate circulation but will depend on the site of your surgery.  If you have a splint applied, your doctor will advise you regarding movement.  Activity: Do not drive or operate machinery until clearance given from physician. No heavy lifting with operative extremity.  Diet:  Drink liquids today or eat a light diet.  You may resume a regular diet tomorrow.    General expectations: Pain for two to three days. Take prescribed medication if given, transition to over-the-counter medication as quickly as possible. Fingers may become slightly swollen.  Call your doctor if any of the following occur: Severe pain not relieved by pain medication. Elevated temperature. Dressing soaked with blood. Inability to move fingers. White or bluish color to fingers.

## 2022-11-10 ENCOUNTER — Encounter (HOSPITAL_BASED_OUTPATIENT_CLINIC_OR_DEPARTMENT_OTHER): Payer: Self-pay | Admitting: Orthopedic Surgery

## 2022-11-11 ENCOUNTER — Encounter: Payer: Self-pay | Admitting: Pharmacist

## 2022-11-11 NOTE — Progress Notes (Signed)
Pharmacy Quality Measure Review  This patient is appearing on a report for being at risk of failing the adherence measure for hypertension (ACEi/ARB) medications this calendar year.   Medication: Lisinopril 10 mg daily Last fill date: 08/26/2022 for 90 day supply  Insurance report was not up to date. No action needed at this time.    Arbutus Leas, PharmD, BCPS [facility] [phone]

## 2022-11-19 ENCOUNTER — Other Ambulatory Visit: Payer: Self-pay

## 2022-11-21 ENCOUNTER — Ambulatory Visit (INDEPENDENT_AMBULATORY_CARE_PROVIDER_SITE_OTHER): Payer: PPO | Admitting: Orthopedic Surgery

## 2022-11-21 DIAGNOSIS — Z9889 Other specified postprocedural states: Secondary | ICD-10-CM

## 2022-11-21 NOTE — Progress Notes (Signed)
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  He is doing well overall, pain is well-controlled.  Numbness and tingling is slowly improving.  Pertinent ROS were reviewed with the patient and found to be negative unless otherwise specified above in HPI.   Assessment & Plan: Visit Diagnoses: No diagnosis found.  Plan: Sutures removed today without incident.  He can resume activities as tolerated.  He does understand that the nerve can take an extensive period of time for full recovery.  I did offer him a follow-up, however he would like to just follow-up as needed which is appropriate.  Follow-up: No follow-ups on file.   Meds & Orders: No orders of the defined types were placed in this encounter.  No orders of the defined types were placed in this encounter.    Procedures: No procedures performed       Objective:   Vital Signs: There were no vitals taken for this visit.  Ortho Exam Left ankle - Well-healed palmar incision, skin edges well-approximated, no erythema or drainage - Able to perform composite fist without significant restriction - Sensation is intact to light touch in all distributions - 5/5 APB, 5/5 index finger FDP  Imaging: No results found.   Wallis Spizzirri Trevor Mace, M.D. [facility] 8:24 AM

## 2022-11-28 ENCOUNTER — Other Ambulatory Visit: Payer: Self-pay

## 2022-11-28 NOTE — Progress Notes (Signed)
This patient is appearing on a report for being at risk of failing the adherence measure for hypertension (ACEi/ARB) medications this calendar year.   Medication: Lisinopril 10 mg tablets  Last fill date: 11/19/2022 for 90 day supply at [facility] report was not up to date. No action needed at this time.    Sofie Rower, PharmD [facility] PGY-1

## 2022-12-01 ENCOUNTER — Other Ambulatory Visit: Payer: Self-pay

## 2022-12-05 ENCOUNTER — Ambulatory Visit (INDEPENDENT_AMBULATORY_CARE_PROVIDER_SITE_OTHER): Payer: PPO

## 2022-12-05 VITALS — Ht 73.0 in | Wt 195.0 lb

## 2022-12-05 DIAGNOSIS — Z0000 Encounter for general adult medical examination without abnormal findings: Secondary | ICD-10-CM

## 2022-12-05 NOTE — Progress Notes (Signed)
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  Visit Complete: Virtual I connected with  Adam Ware, Dr. on 12/05/22 by a audio enabled telemedicine application and verified that I am speaking with the correct person using two identifiers.  Patient Location: Home  Provider Location: Home Office  I discussed the limitations of evaluation and management by telemedicine. The patient expressed understanding and agreed to proceed.  Vital Signs: Because this visit was a virtual/telehealth visit, some criteria may be missing or patient reported. Any vitals not documented were not able to be obtained and vitals that have been documented are patient reported.  Cardiac Risk Factors include: advanced age (>30men, >63 women);hypertension;male gender;dyslipidemia;diabetes mellitus     Objective:    Today's Vitals   12/05/22 1425  Weight: 195 lb (88.5 kg)  Height: 6\' 1"  (1.854 m)   Body mass index is 25.73 kg/m.     12/05/2022    2:30 PM 10/31/2022   12:40 PM 02/06/2022    1:42 PM 01/24/2022    8:04 AM 04/20/2017    8:34 AM 06/15/2014   12:01 PM 03/25/2014    9:14 AM  Advanced Directives  Does Patient Have a Medical Advance Directive? Yes Yes No No Yes Yes No  Type of Estate agent of [place];Living will Healthcare Power of [place];Living will   Living will;Healthcare Power of Attorney Living will;Healthcare Power of Attorney   Does patient want to make changes to medical advance directive? No - Patient declined    No - Patient declined No - Patient declined   Copy of Healthcare Power of Attorney in Chart? Yes - validated most recent copy scanned in chart (See row information)    Yes No - copy requested   Would patient like information on creating a medical advance directive?   No - Patient declined No - Patient declined   No - patient declined information    Current Medications  (verified) Outpatient Encounter Medications as of 12/05/2022  Medication Sig   atorvastatin (LIPITOR) 10 MG tablet Take 1 tablet (10 mg total) by mouth 3 (three) times a week. (Patient taking differently: Take 10 mg by mouth every Monday, Wednesday, and Friday.)   finasteride (PROSCAR) 5 MG tablet Take 1 tablet (5 mg total) by mouth daily. (Patient taking differently: Take 1.25 mg by mouth daily.)   lisinopril (ZESTRIL) 10 MG tablet Take 1 tablet by mouth daily.   metFORMIN (GLUCOPHAGE-XR) 500 MG 24 hr tablet Take 1 tablet (500 mg total) by mouth daily with breakfast.   tadalafil (CIALIS) 20 MG tablet Take 1 tablet (20 mg total) by mouth daily as needed for erectile dysfunction.   tirzepatide (MOUNJARO) 15 MG/0.5ML Pen Inject 15 mg into the skin once a week.   zolpidem (AMBIEN CR) 6.25 MG CR tablet Take 1 tablet (6.25 mg total) by mouth at bedtime as needed for sleep.   No facility-administered encounter medications on file as of 12/05/2022.    Allergies (verified) Sulfa antibiotics   History: Past Medical History:  Diagnosis Date   Arthritis    Diabetes mellitus without complication (HCC)    type 2; tx metformin and wkly ozempic on Sundays   Hair loss    History of kidney stones    surgery to remove   Hyperlipidemia    tx w/atorvastatin   Hypertension    tx w/lisinopril   Insomnia    tx w/ambien  Past Surgical History:  Procedure Laterality Date   APPENDECTOMY     COLONOSCOPY     I & D KNEE WITH POLY EXCHANGE Right 01/24/2022   Procedure: IRRIGATION AND DEBRIDEMENT RIGHT KNEE WITH POLY EXCHANGE;  Surgeon: Nadara Mustard, MD;  Location: [facility] OR;  Service: Orthopedics;  Laterality: Right;   LITHOTRIPSY     MINOR CARPAL TUNNEL Left 11/07/2022   Procedure: MINOR CARPAL TUNNEL;  Surgeon: Samuella Cota, MD;  Location: [facility];  Service: Orthopedics;  Laterality: Left;  NEEDS RNFA (OPEN)   REPLACEMENT TOTAL KNEE Right    TOTAL KNEE REVISION Right 04/22/2017    Procedure: REVISION RIGHT TOTAL KNEE ARTHROPLASTY VS. POLY EXCHANGE;  Surgeon: Nadara Mustard, MD;  Location: [facility] OR;  Service: Orthopedics;  Laterality: Right;   Family History  Problem Relation Age of Onset   Cancer Mother    Diabetes Mother    Mental illness Mother    Heart disease Father    Arthritis Brother    Social History   Socioeconomic History   Marital status: Single    Spouse name: Not on file   Number of children: Not on file   Years of education: Not on file   Highest education level: Not on file  Occupational History   Occupation: Part time  Tobacco Use   Smoking status: Never    Passive exposure: Never   Smokeless tobacco: Never  Vaping Use   Vaping status: Never Used  Substance and Sexual Activity   Alcohol use: Yes    Alcohol/week: 7.0 standard drinks of alcohol    Types: 7 Glasses of wine per week    Comment: wine   Drug use: No   Sexual activity: Not Currently    Partners: Male  Other Topics Concern   Not on file  Social History Narrative   Lives alone with dog   Social Determinants of Health   Financial Resource Strain: Low Risk  (12/05/2022)   Overall Financial Resource Strain (CARDIA)    Difficulty of Paying Living Expenses: Not hard at all  Food Insecurity: No Food Insecurity (12/05/2022)   Hunger Vital Sign    Worried About Running Out of Food in the Last Year: Never true    Ran Out of Food in the Last Year: Never true  Transportation Needs: No Transportation Needs (12/05/2022)   PRAPARE - Administrator, Civil Service (Medical): No    Lack of Transportation (Non-Medical): No  Physical Activity: Sufficiently Active (12/05/2022)   Exercise Vital Sign    Days of Exercise per Week: 4 days    Minutes of Exercise per Session: 50 min  Stress: No Stress Concern Present (12/05/2022)   [employer] of Occupational Health - Occupational Stress Questionnaire    Feeling of Stress : Not at all  Social Connections: Socially Isolated  (12/05/2022)   Social Connection and Isolation Panel [NHANES]    Frequency of Communication with Friends and Family: More than three times a week    Frequency of Social Gatherings with Friends and Family: Three times a week    Attends Religious Services: Never    Active Member of Clubs or Organizations: No    Attends Banker Meetings: Never    Marital Status: Divorced    Tobacco Counseling Counseling given: Not Answered   Clinical Intake:  Pre-visit preparation completed: Yes  Pain : No/denies pain     BMI - recorded: 25.73 Nutritional Status: BMI 25 -29 Overweight Nutritional  Risks: None Diabetes: Yes CBG done?: No Did pt. bring in CBG monitor from home?: No  How often do you need to have someone help you when you read instructions, pamphlets, or other written materials from your doctor or pharmacy?: 1 - Never  Interpreter Needed?: No  Information entered by :: Shamal Stracener, RMA   Activities of Daily Living    12/05/2022    2:28 PM 01/24/2022    7:59 AM  In your present state of health, do you have any difficulty performing the following activities:  Hearing? 0 0  Vision? 0 0  Difficulty concentrating or making decisions? 0 0  Walking or climbing stairs? 0 1  Dressing or bathing? 0 0  Doing errands, shopping? 0   Preparing Food and eating ? N   Using the Toilet? N   In the past six months, have you accidently leaked urine? N   Do you have problems with loss of bowel control? N   Managing your Medications? N   Managing your Finances? N   Housekeeping or managing your Housekeeping? N     Patient Care Team: Etta Grandchild, MD as PCP - General (Internal Medicine) Reuben Likes, MD as Consulting Physician (Emergency Medicine) [facility]  Indicate any recent Medical Services you may have received from other than [facility] providers in the past year (date may be approximate).     Assessment:   This is a routine wellness  examination for Talen.  Hearing/Vision screen Hearing Screening - Comments:: Denies hearing difficulties   Vision Screening - Comments:: Wears eyeglasses   Goals Addressed               This Visit's Progress     Patient Stated (pt-stated)        Continue to lose weight      Depression Screen    12/05/2022    2:33 PM 12/11/2021    8:09 AM 09/24/2020    8:26 AM 05/04/2019    1:13 PM 05/05/2018    9:57 AM 06/10/2017    8:34 AM  PHQ 2/9 Scores  PHQ - 2 Score 0 0 0 0 0 0  PHQ- 9 Score 0 0        Fall Risk    12/05/2022    2:31 PM 12/11/2021    8:09 AM 09/24/2020    8:26 AM 05/04/2019    1:13 PM 05/05/2018    9:57 AM  Fall Risk   Falls in the past year? 0 0 0 0 0  Number falls in past yr: 0 0 0 0 0  Injury with Fall? 0 0 0 0 0  Risk for fall due to : No Fall Risks No Fall Risks     Follow up Falls prevention discussed;Falls evaluation completed Falls evaluation completed  Falls evaluation completed Falls evaluation completed    MEDICARE RISK AT HOME: Medicare Risk at Home Any stairs in or around the home?: Yes If so, are there any without handrails?: Yes Home free of loose throw rugs in walkways, pet beds, electrical cords, etc?: Yes Adequate lighting in your home to reduce risk of falls?: Yes Life alert?: No Use of a cane, walker or w/c?: No Grab bars in the bathroom?: No Shower chair or bench in shower?: No Elevated toilet seat or a handicapped toilet?: No  TIMED UP AND GO:  Was the test performed? No    Cognitive Function:        12/05/2022  2:40 PM  6CIT Screen  What Year? 0 points  What month? 0 points  What time? 0 points  Count back from 20 0 points  Months in reverse 0 points  Repeat phrase 0 points  Total Score 0 points    Immunizations Immunization History  Administered Date(s) Administered   Hepatitis A 12/20/1986   Influenza Whole 11/09/2012   Influenza, High Dose Seasonal PF 10/07/2016, 11/05/2017, 11/28/2020, 11/04/2021, 11/04/2022    Influenza-Unspecified 11/07/2013, 10/15/2014, 10/15/2015, 12/01/2018, 11/23/2019   PFIZER(Purple Top)SARS-COV-2 Vaccination 10/12/2018, 11/12/2018, 09/24/2019   Pfizer Covid-19 Vaccine Bivalent Booster 81yrs & up 12/28/2020   Pfizer(Comirnaty)Fall Seasonal Vaccine 12 years and older 12/09/2021   Pneumococcal Conjugate-13 05/22/2015   Pneumococcal Polysaccharide-23 04/06/2017   Tdap 01/23/2006, 05/22/2015   Zoster Recombinant(Shingrix) 06/26/2016, 10/07/2016   Zoster, Live 10/17/2013    TDAP status: Up to date  Flu Vaccine status: Up to date  Pneumococcal vaccine status: Up to date  Covid-19 vaccine status: Information provided on how to obtain vaccines.   Qualifies for Shingles Vaccine? Yes   Zostavax completed Yes   Shingrix Completed?: Yes  Screening Tests Health Maintenance  Topic Date Due   COVID-19 Vaccine (6 - 2023-24 season) 10/19/2022   Diabetic kidney evaluation - Urine ACR  12/12/2022   FOOT EXAM  12/12/2022   OPHTHALMOLOGY EXAM  02/07/2023   HEMOGLOBIN A1C  02/11/2023   Diabetic kidney evaluation - eGFR measurement  09/01/2023   Medicare Annual Wellness (AWV)  12/05/2023   DTaP/Tdap/Td (3 - Td or Tdap) 05/21/2025   Colonoscopy  08/10/2029   Pneumonia Vaccine 68+ Years old  Completed   INFLUENZA VACCINE  Completed   Hepatitis C Screening  Completed   Zoster Vaccines- Shingrix  Completed   HPV VACCINES  Aged Out    Health Maintenance  Health Maintenance Due  Topic Date Due   COVID-19 Vaccine (6 - 2023-24 season) 10/19/2022   Diabetic kidney evaluation - Urine ACR  12/12/2022    Colorectal cancer screening: Type of screening: Colonoscopy. Completed 08/11/2019. Repeat every 7 years  Lung Cancer Screening: (Low Dose CT Chest recommended if Age 16-80 years, 20 pack-year currently smoking OR have quit w/in 15years.) does not qualify.   Lung Cancer Screening Referral: N/A  Additional Screening:  Hepatitis C Screening: does qualify; Completed  04/25/2013  Vision Screening: Recommended annual ophthalmology exams for early detection of glaucoma and other disorders of the eye. Is the patient up to date with their annual eye exam?  Yes  Who is the provider or what is the name of the office in which the patient attends annual eye exams? Dr. Dione Booze If pt is not established with a provider, would they like to be referred to a provider to establish care? No .   Dental Screening: Recommended annual dental exams for proper oral hygiene  Diabetic Foot Exam: Diabetic Foot Exam: Completed 12/11/2021  Community Resource Referral / Chronic Care Management: CRR required this visit?  No   CCM required this visit?  No    Plan:     I have personally reviewed and noted the following in the patient's chart:   Medical and social history Use of alcohol, tobacco or illicit drugs  Current medications and supplements including opioid prescriptions. Patient is not currently taking opioid prescriptions. Functional ability and status Nutritional status Physical activity Advanced directives List of other physicians Hospitalizations, surgeries, and ER visits in previous 12 months Vitals Screenings to include cognitive, depression, and falls Referrals and appointments  In addition, I  have reviewed and discussed with patient certain preventive protocols, quality metrics, and best practice recommendations. A written personalized care plan for preventive services as well as general preventive health recommendations were provided to patient.     Albena Comes L Jerie Basford, CMA   12/05/2022   After Visit Summary: (MyChart) Due to this being a telephonic visit, the after visit summary with patients personalized plan was offered to patient via MyChart   Nurse Notes: Patient is due for a Covid vaccine and will be getting it soon.  He had no concerns to address today.

## 2022-12-05 NOTE — Patient Instructions (Signed)
Adam Ware , Thank you for taking time to come for your Medicare Wellness Visit. I appreciate your ongoing commitment to your health goals. Please review the following plan we discussed and let me know if I can assist you in the future.   Referrals/Orders/Follow-Ups/Clinician Recommendations: It was nice to speak with you today.  Keep up the good work.  This is a list of the screening recommended for you and due dates:  Health Maintenance  Topic Date Due   COVID-19 Vaccine (6 - 2023-24 season) 10/19/2022   Yearly kidney health urinalysis for diabetes  12/12/2022   Complete foot exam   12/12/2022   Eye exam for diabetics  02/07/2023   Hemoglobin A1C  02/11/2023   Yearly kidney function blood test for diabetes  09/01/2023   Medicare Annual Wellness Visit  12/05/2023   DTaP/Tdap/Td vaccine (3 - Td or Tdap) 05/21/2025   Colon Cancer Screening  08/10/2029   Pneumonia Vaccine  Completed   Flu Shot  Completed   Hepatitis C Screening  Completed   Zoster (Shingles) Vaccine  Completed   HPV Vaccine  Aged Out    Advanced directives: (In Chart) A copy of your advanced directives are scanned into your chart should your provider ever need it.  Next Medicare Annual Wellness Visit scheduled for next year: Yes

## 2022-12-08 ENCOUNTER — Other Ambulatory Visit (HOSPITAL_COMMUNITY): Payer: Self-pay

## 2022-12-10 ENCOUNTER — Other Ambulatory Visit: Payer: Self-pay

## 2022-12-10 ENCOUNTER — Other Ambulatory Visit (HOSPITAL_COMMUNITY): Payer: Self-pay

## 2023-01-06 ENCOUNTER — Encounter: Payer: Self-pay | Admitting: Orthopedic Surgery

## 2023-01-06 ENCOUNTER — Other Ambulatory Visit (HOSPITAL_COMMUNITY): Payer: Self-pay

## 2023-01-09 ENCOUNTER — Ambulatory Visit: Payer: PPO | Admitting: Orthopedic Surgery

## 2023-01-09 DIAGNOSIS — Z9889 Other specified postprocedural states: Secondary | ICD-10-CM

## 2023-01-09 NOTE — Progress Notes (Unsigned)
   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  He is doing well overall, pain remains well-controlled.  Numbness and tingling is slowly improving.  He does report some recent setback with weakness he feels particularly with thumb abduction and grip strength.  Pertinent ROS were reviewed with the patient and found to be negative unless otherwise specified above in HPI.   Assessment & Plan: Visit Diagnoses: No diagnosis found.  Plan: Extensive discussion was had the patient today regarding his left hand complaints.  He is unclear why he had a significant setback in his progress, particular from a strength standpoint.  I have recommended that he utilize his brace at night, to prevent any irritation on the carpal tunnel and median nerve region.  Continue with activities as tolerated.  Should he continue to experience weakness, we can consider establishing with occupational therapy for dedicated exercises for the thumb abductor musculature.  Follow-up: No follow-ups on file.   Meds & Orders: No orders of the defined types were placed in this encounter.  No orders of the defined types were placed in this encounter.    Procedures: No procedures performed       Objective:   Vital Signs: There were no vitals taken for this visit.  Ortho Exam Left ankle - Well-healed palmar incision, skin edges well-approximated, no erythema or drainage - Able to perform composite fist without significant restriction - Sensation is intact to light touch in all distributions, 2-point discrimination between 5 and 6 mm in the median nerve distribution - 4+/5 APB, 5/5 index finger FDP  Imaging: No results  found.   Adam Ware, M.D. [facility] 9:43 AM

## 2023-01-21 ENCOUNTER — Other Ambulatory Visit: Payer: Self-pay

## 2023-01-21 ENCOUNTER — Other Ambulatory Visit: Payer: Self-pay | Admitting: Internal Medicine

## 2023-01-21 ENCOUNTER — Other Ambulatory Visit (HOSPITAL_COMMUNITY): Payer: Self-pay

## 2023-01-21 DIAGNOSIS — F5104 Psychophysiologic insomnia: Secondary | ICD-10-CM

## 2023-01-21 DIAGNOSIS — E119 Type 2 diabetes mellitus without complications: Secondary | ICD-10-CM

## 2023-01-21 MED ORDER — MOUNJARO 15 MG/0.5ML ~~LOC~~ SOAJ
15.0000 mg | SUBCUTANEOUS | 0 refills | Status: DC
  Filled 2023-01-21: qty 6, 84d supply, fill #0

## 2023-01-21 MED ORDER — ZOLPIDEM TARTRATE ER 6.25 MG PO TBCR
6.2500 mg | EXTENDED_RELEASE_TABLET | Freq: Every evening | ORAL | 0 refills | Status: AC | PRN
Start: 2023-01-21 — End: ?
  Filled 2023-01-21 (×2): qty 90, 90d supply, fill #0

## 2023-01-22 ENCOUNTER — Other Ambulatory Visit: Payer: Self-pay

## 2023-01-23 ENCOUNTER — Other Ambulatory Visit: Payer: Self-pay | Admitting: Sports Medicine

## 2023-01-23 ENCOUNTER — Ambulatory Visit: Payer: PPO | Admitting: Physical Therapy

## 2023-01-23 ENCOUNTER — Other Ambulatory Visit: Payer: Self-pay

## 2023-01-23 ENCOUNTER — Encounter: Payer: Self-pay | Admitting: Physical Therapy

## 2023-01-23 DIAGNOSIS — G8929 Other chronic pain: Secondary | ICD-10-CM | POA: Diagnosis not present

## 2023-01-23 DIAGNOSIS — M25511 Pain in right shoulder: Secondary | ICD-10-CM

## 2023-01-23 DIAGNOSIS — M6281 Muscle weakness (generalized): Secondary | ICD-10-CM

## 2023-01-23 DIAGNOSIS — R293 Abnormal posture: Secondary | ICD-10-CM | POA: Diagnosis not present

## 2023-01-23 DIAGNOSIS — R29898 Other symptoms and signs involving the musculoskeletal system: Secondary | ICD-10-CM

## 2023-01-23 NOTE — Therapy (Signed)
OUTPATIENT PHYSICAL THERAPY SHOULDER EVALUATION   Patient Name: Adam Ware, Dr. MRN: [id number] DOB:10/09/1950, 72 y.o., male Today's Date: 01/23/2023  END OF SESSION:  PT End of Session - 01/23/23 1540     Visit Number 1    Number of Visits 1    Authorization Type HTA    Authorization Time Period 01/23/23 to 01/23/23    PT Start Time 1431    PT Stop Time 1515    PT Time Calculation (min) 44 min    Activity Tolerance Patient tolerated treatment well    Behavior During Therapy [facility] for tasks assessed/performed             Past Medical History:  Diagnosis Date   Arthritis    Diabetes mellitus without complication (HCC)    type 2; tx metformin and wkly ozempic on Sundays   Hair loss    History of kidney stones    surgery to remove   Hyperlipidemia    tx w/atorvastatin   Hypertension    tx w/lisinopril   Insomnia    tx w/ambien   Past Surgical History:  Procedure Laterality Date   APPENDECTOMY     COLONOSCOPY     I & D KNEE WITH POLY EXCHANGE Right 01/24/2022   Procedure: IRRIGATION AND DEBRIDEMENT RIGHT KNEE WITH POLY EXCHANGE;  Surgeon: Nadara Mustard, MD;  Location: [facility] OR;  Service: Orthopedics;  Laterality: Right;   LITHOTRIPSY     MINOR CARPAL TUNNEL Left 11/07/2022   Procedure: MINOR CARPAL TUNNEL;  Surgeon: Samuella Cota, MD;  Location: [facility];  Service: Orthopedics;  Laterality: Left;  NEEDS RNFA (OPEN)   REPLACEMENT TOTAL KNEE Right    TOTAL KNEE REVISION Right 04/22/2017   Procedure: REVISION RIGHT TOTAL KNEE ARTHROPLASTY VS. POLY EXCHANGE;  Surgeon: Nadara Mustard, MD;  Location: [facility] OR;  Service: Orthopedics;  Laterality: Right;   Patient Active Problem List   Diagnosis Date Noted   High serum lipoprotein(a) 09/05/2022   Encounter for general adult medical examination with abnormal findings 06/12/2021   Carpal tunnel syndrome, left upper limb 11/28/2020   Carpal tunnel syndrome, right upper limb 11/28/2020   Erectile  dysfunction due to arterial insufficiency 09/24/2020   Benign prostatic hyperplasia with weak urinary stream 05/05/2018   Hyperlipidemia LDL goal <100 05/05/2018   Pure hyperglyceridemia 05/05/2018   Psychophysiological insomnia 05/05/2018   Lumbar radiculopathy 07/15/2017   Hyperlipidemia associated with type 2 diabetes mellitus (HCC) 06/10/2017   Essential hypertension 06/10/2017   Type 2 diabetes mellitus without complication, without long-term current use of insulin (HCC) 06/10/2017   Spondylolisthesis, lumbar region 04/21/2017    PCP: Sanda Linger MD   REFERRING PROVIDER: Madelyn Brunner, DO  REFERRING DIAG: Diagnosis [phone] (ICD-10-CM) - Chronic left shoulder pain  THERAPY DIAG:  Chronic right shoulder pain  Abnormal posture  Muscle weakness (generalized)  Other symptoms and signs involving the musculoskeletal system  Rationale for Evaluation and Treatment: Rehabilitation  ONSET DATE: several weeks ago   SUBJECTIVE:  SUBJECTIVE STATEMENT:  I'm here for my shoulder, I thought it was some rotator cuff tendonitis. Feeling some clicking in my biceps tendon on the left but its not painful. Feeling a pressure in back of left shoulder especially at night. Noticed it several weeks ago.  Hand dominance: Right  PERTINENT HISTORY:  See above   PAIN:  Are you having pain? No  PRECAUTIONS: None  RED FLAGS: None   WEIGHT BEARING RESTRICTIONS: No  FALLS:  Has patient fallen in last 6 months? No  LIVING ENVIRONMENT: Lives with: lives alone Lives in: House/apartment Stairs: 2 STE  Has following equipment at home: None  OCCUPATION: MD   PLOF: Independent, Independent with basic ADLs, Independent with gait, and Independent with transfers  PATIENT GOALS:get rid of clicking   NEXT  MD VISIT:   OBJECTIVE:  Note: Objective measures were completed at Evaluation unless otherwise noted.  DIAGNOSTIC FINDINGS:   None available in chart   PATIENT SURVEYS:  FOTO not set up at eval   COGNITION: Overall cognitive status: Within functional limits for tasks assessed       POSTURE:  Rounded shoulders, forward head   UPPER EXTREMITY ROM:   Active ROM Right eval Left eval  Shoulder flexion  160*  Shoulder extension    Shoulder abduction  175*  Shoulder adduction    Shoulder internal rotation  L2  Shoulder external rotation  T3  Elbow flexion    Elbow extension    Wrist flexion    Wrist extension    Wrist ulnar deviation    Wrist radial deviation    Wrist pronation    Wrist supination    (Blank rows = not tested)  UPPER EXTREMITY MMT:  MMT Right eval Left eval  Shoulder flexion  4+  Shoulder extension    Shoulder abduction  4+  Shoulder adduction    Shoulder internal rotation  4+  Shoulder external rotation  4+  Middle trapezius    Lower trapezius    Elbow flexion    Elbow extension    Wrist flexion    Wrist extension    Wrist ulnar deviation    Wrist radial deviation    Wrist pronation    Wrist supination    Grip strength (lbs)    (Blank rows = not tested)    PALPATION:   Palpable clicking noted around biceps tendon with active ER at his side, not painful; large trigger point noted in L levator    TODAY'S TREATMENT:                                                                                                                                         DATE:   01/23/23  Eval, extensive HEP for shoulder and wrist    [place] 6 Biceps/pec stretch Eccentric biceps Shoulder flexion with dumb-bell Shoulder ABD with dumb-bell  Levator stretch Discussed basic exercises for  wrist- extensor stretch, scar massage, lumbrical ROM and strength, thumb/finger opposition    PATIENT EDUCATION: Education details: advanced HEP  Person  educated: Patient Education method: Explanation Education comprehension: verbalized understanding  HOME EXERCISE PROGRAM:  Access Code: [email or web address] URL: [email or web address] Date: 01/23/2023 Prepared by: Nedra Hai  Exercises - Standing Bicep Stretch at Wall  - 1 x daily - 7 x weekly - 1 sets - 3 reps - 30 seconds  hold - Standing Eccentric Bicep Curl Pronated then Supinated  - 1 x daily - 7 x weekly - 2 sets - 10 reps - Seated Single Arm Shoulder Flexion with Dumbbell  - 1 x daily - 7 x weekly - 2 sets - 10 reps - Seated Shoulder Abduction with Dumbbells - Thumbs Up  - 1 x daily - 7 x weekly - 2 sets - 10 reps - Gentle Levator Scapulae Stretch  - 1 x daily - 7 x weekly - 1 sets - 3 reps - 30 seconds  hold     Access Code: [email or web address] URL: [email or web address] Date: 01/23/2023 Prepared by: Nedra Hai  Exercises - Seated Wrist Extension Stretch  - 1 x daily - 7 x weekly - 2 sets - 10 reps - Hand AROM Lumbrical  - 1 x daily - 7 x weekly - 2 sets - 10 reps - Lumbrical Strengthening  - 1 x daily - 7 x weekly - 2 sets - 10 reps - Thumb Opposition  - 1 x daily - 7 x weekly - 2 sets - 10 reps  ASSESSMENT:  CLINICAL IMPRESSION: Patient is a 72 y.o. M who was seen today for physical therapy evaluation and treatment for Diagnosis M25.512,G89.29 (ICD-10-CM) - Chronic left shoulder pain. He also endorses some difficulty with L hand/wrist function after carpal tunnel surgery 11/07/22. Focused on providing appropriate advanced HEP, also discussed basic HEP for hand/wrist and advised him to make an appointment with hand specialist OT in clinic if he continues to have trouble with this, also encouraged scar massage to surgical site. Left session with all questions addressed and no acute distress this afternoon, one time visit per his request.   OBJECTIVE IMPAIRMENTS: decreased ROM, decreased strength, increased fascial restrictions, and postural dysfunction.    ACTIVITY LIMITATIONS: carrying, lifting, sleeping, and reach over head  PARTICIPATION LIMITATIONS: community activity and occupation  PERSONAL FACTORS: Education, Past/current experiences, Profession, and Time since onset of injury/illness/exacerbation are also affecting patient's functional outcome.   REHAB POTENTIAL: Excellent  CLINICAL DECISION MAKING: Stable/uncomplicated  EVALUATION COMPLEXITY: Low   GOALS: Goals reviewed with patient? No  SHORT TERM GOALS: Target date: 01/23/23   Will be independent in appropriate HEP for shoulder and wrist concerns  Baseline: Goal status: INITIAL   LONG TERM GOALS: Target date: no LTGs, one time visit only     PLAN:  PT FREQUENCY: one time visit  PT DURATION:  1 sessions  PLANNED INTERVENTIONS: 97110-Therapeutic exercises and 95621- Self Care  PLAN FOR NEXT SESSION: one time visit only   Nedra Hai, PT, DPT 01/23/23 3:41 PM

## 2023-02-09 ENCOUNTER — Encounter: Payer: PPO | Admitting: Sports Medicine

## 2023-02-12 ENCOUNTER — Encounter: Payer: Self-pay | Admitting: Internal Medicine

## 2023-02-13 ENCOUNTER — Other Ambulatory Visit (HOSPITAL_COMMUNITY): Payer: Self-pay

## 2023-02-13 ENCOUNTER — Encounter: Payer: Self-pay | Admitting: Internal Medicine

## 2023-02-13 ENCOUNTER — Ambulatory Visit (INDEPENDENT_AMBULATORY_CARE_PROVIDER_SITE_OTHER): Payer: PPO | Admitting: Internal Medicine

## 2023-02-13 ENCOUNTER — Ambulatory Visit
Admission: RE | Admit: 2023-02-13 | Discharge: 2023-02-13 | Disposition: A | Discharge status: Home / Self Care | Payer: PPO | Source: Ambulatory Visit | Attending: Internal Medicine | Admitting: Internal Medicine

## 2023-02-13 ENCOUNTER — Other Ambulatory Visit: Payer: Self-pay | Admitting: Internal Medicine

## 2023-02-13 VITALS — BP 130/74 | HR 94 | Temp 97.6°F | Resp 16 | Ht 73.0 in | Wt 194.0 lb

## 2023-02-13 DIAGNOSIS — R300 Dysuria: Secondary | ICD-10-CM | POA: Diagnosis not present

## 2023-02-13 DIAGNOSIS — N401 Enlarged prostate with lower urinary tract symptoms: Secondary | ICD-10-CM

## 2023-02-13 DIAGNOSIS — R3912 Poor urinary stream: Secondary | ICD-10-CM

## 2023-02-13 DIAGNOSIS — E278 Other specified disorders of adrenal gland: Secondary | ICD-10-CM | POA: Insufficient documentation

## 2023-02-13 DIAGNOSIS — R310 Gross hematuria: Secondary | ICD-10-CM | POA: Insufficient documentation

## 2023-02-13 DIAGNOSIS — K8020 Calculus of gallbladder without cholecystitis without obstruction: Secondary | ICD-10-CM | POA: Diagnosis not present

## 2023-02-13 DIAGNOSIS — N410 Acute prostatitis: Secondary | ICD-10-CM | POA: Diagnosis not present

## 2023-02-13 DIAGNOSIS — N200 Calculus of kidney: Secondary | ICD-10-CM | POA: Diagnosis not present

## 2023-02-13 DIAGNOSIS — I10 Essential (primary) hypertension: Secondary | ICD-10-CM | POA: Diagnosis not present

## 2023-02-13 DIAGNOSIS — E119 Type 2 diabetes mellitus without complications: Secondary | ICD-10-CM

## 2023-02-13 DIAGNOSIS — R319 Hematuria, unspecified: Secondary | ICD-10-CM | POA: Diagnosis not present

## 2023-02-13 LAB — HEMOGLOBIN A1C: Hgb A1c MFr Bld: 6.3 % (ref 4.6–6.5)

## 2023-02-13 LAB — CBC WITH DIFFERENTIAL/PLATELET
Basophils Absolute: 0 10*3/uL (ref 0.0–0.1)
Basophils Relative: 0.3 % (ref 0.0–3.0)
Eosinophils Absolute: 0.1 10*3/uL (ref 0.0–0.7)
Eosinophils Relative: 1.1 % (ref 0.0–5.0)
HCT: 41.6 % (ref 39.0–52.0)
Hemoglobin: 13.6 g/dL (ref 13.0–17.0)
Lymphocytes Relative: 37.6 % (ref 12.0–46.0)
Lymphs Abs: 2.3 10*3/uL (ref 0.7–4.0)
MCHC: 32.8 g/dL (ref 30.0–36.0)
MCV: 89.1 fL (ref 78.0–100.0)
Monocytes Absolute: 0.3 10*3/uL (ref 0.1–1.0)
Monocytes Relative: 5.5 % (ref 3.0–12.0)
Neutro Abs: 3.4 10*3/uL (ref 1.4–7.7)
Neutrophils Relative %: 55.5 % (ref 43.0–77.0)
Platelets: 275 10*3/uL (ref 150.0–400.0)
RBC: 4.67 Mil/uL (ref 4.22–5.81)
RDW: 13.7 % (ref 11.5–15.5)
WBC: 6.2 10*3/uL (ref 4.0–10.5)

## 2023-02-13 LAB — BASIC METABOLIC PANEL
BUN: 13 mg/dL (ref 6–23)
CO2: 30 meq/L (ref 19–32)
Calcium: 9.1 mg/dL (ref 8.4–10.5)
Chloride: 102 meq/L (ref 96–112)
Creatinine, Ser: 0.73 mg/dL (ref 0.40–1.50)
GFR: 89.5 mL/min (ref >60.00)
Glucose, Bld: 156 mg/dL — ABNORMAL HIGH (ref 70–99)
Potassium: 4.2 meq/L (ref 3.5–5.1)
Sodium: 140 meq/L (ref 135–145)

## 2023-02-13 LAB — URINALYSIS, ROUTINE W REFLEX MICROSCOPIC
Bilirubin Urine: NEGATIVE
Ketones, ur: NEGATIVE
Leukocytes,Ua: NEGATIVE
Nitrite: NEGATIVE
Specific Gravity, Urine: >=1.03 — AB (ref 1.000–1.030)
Total Protein, Urine: 100 — AB
Urine Glucose: NEGATIVE
Urobilinogen, UA: 1 (ref 0.0–1.0)
pH: 5 (ref 5.0–8.0)

## 2023-02-13 LAB — MICROALBUMIN / CREATININE URINE RATIO
Creatinine,U: 167.3 mg/dL
Microalb Creat Ratio: 9.7 mg/g (ref 0.0–30.0)
Microalb, Ur: 16.2 mg/dL — ABNORMAL HIGH (ref 0.0–1.9)

## 2023-02-13 MED ORDER — CIPROFLOXACIN HCL 500 MG PO TABS
500.0000 mg | ORAL_TABLET | Freq: Two times a day (BID) | ORAL | 0 refills | Status: AC
Start: 2023-02-13 — End: 2023-02-23
  Filled 2023-02-13: qty 20, 10d supply, fill #0

## 2023-02-13 NOTE — Progress Notes (Signed)
Subjective:  Patient ID: Adam Ware, Dr., male    DOB: 1950-09-26  Age: 72 y.o. MRN: [id number]  CC: Hypertension and Diabetes   HPI Adam Ware, Dr. presents for f/up ----  Discussed the use of AI scribe software for clinical note transcription with the patient, who gave verbal consent to proceed.  History of Present Illness   The patient, with a history of urinary stone disease, presents with intermittent hematuria over the past two days. He first noticed the change in urine color two days ago, which became more pronounced by the end of the day. After rehydration, the urine color returned to normal, but the hematuria recurred today. The patient denies any associated symptoms such as fever, chills, dysuria, abdominal pain, back pain, urinary hesitancy, decreased stream, or increased frequency. He reports a similar episode several years ago, which was attributed to a urinary stone. At that time, a cystoscopy was performed to rule out bladder tumors, and a 1 cm stone was identified and treated with lithotripsy. The patient did not experience significant pain during that episode, rating it as a 1 or 2 on the pain scale. He recalls passing fragments of the stone after the procedure. The patient is otherwise active and denies any cardiovascular symptoms. He has no history of smoking or exposure to potential carcinogens.       Outpatient Medications Prior to Visit  Medication Sig Dispense Refill   atorvastatin (LIPITOR) 10 MG tablet Take 1 tablet (10 mg total) by mouth 3 (three) times a week. (Patient taking differently: Take 10 mg by mouth every Monday, Wednesday, and Friday.) 90 tablet 1   finasteride (PROSCAR) 5 MG tablet Take 1 tablet (5 mg total) by mouth daily. (Patient taking differently: Take 1.25 mg by mouth daily.) 90 tablet 1   lisinopril (ZESTRIL) 10 MG tablet Take 1 tablet by mouth daily. 90 tablet 1   metFORMIN (GLUCOPHAGE-XR) 500 MG 24 hr tablet Take 1 tablet (500 mg total) by  mouth daily with breakfast. 90 tablet 1   tadalafil (CIALIS) 20 MG tablet Take 1 tablet (20 mg total) by mouth daily as needed for erectile dysfunction. 30 tablet 1   tirzepatide (MOUNJARO) 15 MG/0.5ML Pen Inject 15 mg into the skin once a week. 6 mL 0   zolpidem (AMBIEN CR) 6.25 MG CR tablet Take 1 tablet (6.25 mg total) by mouth at bedtime as needed for sleep. 90 tablet 0   No facility-administered medications prior to visit.    ROS Review of Systems  Constitutional:  Negative for chills, diaphoresis, fatigue and fever.  HENT: Negative.    Eyes: Negative.   Respiratory: Negative.  Negative for cough, chest tightness, shortness of breath and wheezing.   Cardiovascular:  Negative for chest pain, palpitations and leg swelling.  Gastrointestinal:  Negative for abdominal pain, nausea and vomiting.  Genitourinary:  Positive for hematuria. Negative for difficulty urinating, dysuria, flank pain, frequency, testicular pain and urgency.  Musculoskeletal: Negative.  Negative for arthralgias and myalgias.  Skin: Negative.  Negative for rash.  Neurological: Negative.  Negative for dizziness and weakness.  Hematological:  Negative for adenopathy. Does not bruise/bleed easily.  Psychiatric/Behavioral: Negative.      Objective:  BP 130/74 (BP Location: Left Arm, Patient Position: Sitting, Cuff Size: Large)   Pulse 94   Temp 97.6 F (36.4 C) (Temporal)   Resp 16   Ht 6\' 1"  (1.854 m)   Wt 194 lb (88 kg)   SpO2 98%   BMI  25.60 kg/m   BP Readings from Last 3 Encounters:  02/13/23 130/74  11/07/22 134/82  09/01/22 128/78    Wt Readings from Last 3 Encounters:  02/13/23 194 lb (88 kg)  12/05/22 195 lb (88.5 kg)  10/31/22 195 lb (88.5 kg)    Physical Exam Vitals reviewed.  Constitutional:      General: He is not in acute distress.    Appearance: He is not ill-appearing, toxic-appearing or diaphoretic.  HENT:     Nose: Nose normal.     Mouth/Throat:     Mouth: Mucous membranes are  moist.  Eyes:     General: No scleral icterus.    Conjunctiva/sclera: Conjunctivae normal.  Cardiovascular:     Rate and Rhythm: Normal rate and regular rhythm.     Pulses: Normal pulses.     Heart sounds: No murmur heard.    No friction rub. No gallop.  Pulmonary:     Effort: Pulmonary effort is normal.     Breath sounds: No stridor. No wheezing, rhonchi or rales.  Abdominal:     General: Abdomen is flat. Bowel sounds are normal.     Palpations: There is no mass.     Tenderness: There is no abdominal tenderness. There is no right CVA tenderness, left CVA tenderness or guarding.     Hernia: No hernia is present.  Musculoskeletal:        General: Normal range of motion.     Cervical back: Neck supple.     Right lower leg: No edema.     Left lower leg: No edema.  Lymphadenopathy:     Cervical: No cervical adenopathy.  Skin:    General: Skin is warm and dry.  Neurological:     General: No focal deficit present.     Mental Status: He is alert. Mental status is at baseline.     Lab Results  Component Value Date   WBC 6.2 02/13/2023   HGB 13.6 02/13/2023   HCT 41.6 02/13/2023   PLT 275.0 02/13/2023   GLUCOSE 156 (H) 02/13/2023   CHOL 126 09/01/2022   TRIG 185.0 (H) 09/01/2022   HDL 31.90 (L) 09/01/2022   LDLDIRECT 59.0 05/05/2018   LDLCALC 58 09/01/2022   ALT 25 09/01/2022   AST 17 09/01/2022   NA 140 02/13/2023   K 4.2 02/13/2023   CL 102 02/13/2023   CREATININE 0.73 02/13/2023   BUN 13 02/13/2023   CO2 30 02/13/2023   TSH 1.70 05/21/2021   PSA 0.31 12/11/2021   INR 1.05 04/20/2017   HGBA1C 6.3 02/13/2023   MICROALBUR 16.2 (H) 02/13/2023   CT RENAL STONE STUDY Result Date: 02/13/2023 CLINICAL DATA:  Hematuria and dysuria for 1 day EXAM: CT ABDOMEN AND PELVIS WITHOUT CONTRAST TECHNIQUE: Multidetector CT imaging of the abdomen and pelvis was performed following the standard protocol without IV contrast. RADIATION DOSE REDUCTION: This exam was performed according  to the departmental dose-optimization program which includes automated exposure control, adjustment of the mA and/or kV according to patient size and/or use of iterative reconstruction technique. COMPARISON:  03/25/2014 FINDINGS: Lower chest: No acute pleural or parenchymal lung disease. Hepatobiliary: Unremarkable unenhanced appearance of the liver. Calcified gallstones without evidence of acute cholecystitis. No biliary duct dilation or choledocholithiasis. Pancreas: Unremarkable unenhanced appearance. Spleen: Unremarkable unenhanced appearance. 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 Areas of macroscopic fat and coarse calcification has developed within the mass since prior  study. Definitive characterization with MRI is recommended if not previously performed. Left adrenal is unremarkable. There is a punctate 3 mm nonobstructing calculus lower pole right kidney. No evidence of right-sided obstruction. Numerous left renal calculi are identified, largest in the left renal pelvis measuring 11 x 19 x 15 mm. There is mild distension of the proximal left ureter with minimal Peri ureteral fat stranding, which may reflect irritation from the renal pelvis calculus. No evidence of high-grade obstruction. Simple appearing right renal cyst does not require imaging follow-up. The bladder is unremarkable. Stomach/Bowel: No bowel obstruction or ileus. Scattered colonic diverticulosis without evidence of acute diverticulitis. No bowel wall thickening or inflammatory change. Vascular/Lymphatic: Aortic atherosclerosis. No enlarged abdominal or pelvic lymph nodes. Reproductive: Prostate is unremarkable. Other: No free fluid or free intraperitoneal gas. No abdominal wall hernia. Musculoskeletal: No acute or destructive bony abnormalities. 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 Progressive spondylosis at the  L5-S1 level. Reconstructed images demonstrate no additional findings. IMPRESSION: 1. Multiple large nonobstructing left renal calculi, largest in the left renal pelvis measuring up to 19 mm. Peri ureteral fat stranding and mild dilatation of the proximal left ureter likely reflect irritation from stone movement, though there is no high-grade obstruction at this time. 2. Nonobstructing 3 mm right renal calculus. 3. Cholelithiasis without cholecystitis. 4. Indeterminate right adrenal mass again noted, with interval increase in size and development of macroscopic fat and coarse calcifications. If not previously performed, definitive characterization with dedicated adrenal MRI or CT recommended. 5.  Aortic Atherosclerosis (ICD10-I70.0). Electronically Signed   By: Sharlet Salina M.D.   On: 02/13/2023 15:14     Assessment & Plan:   Essential hypertension -     Urinalysis, Routine w reflex microscopic; Future -     CBC with Differential/Platelet; Future -     Basic metabolic panel; Future  Type 2 diabetes mellitus without complication, without long-term current use of insulin (HCC) -     Microalbumin / creatinine urine ratio; Future -     Urinalysis, Routine w reflex microscopic; Future -     Basic metabolic panel; Future -     Hemoglobin A1c; Future  Gross hematuria- RBC's are TNTC and there are a few WBC's. Will evaluate for stone and renal/bladder pathology with a Renal CT. Will start a FQ for the infection. -     Urinalysis, Routine w reflex microscopic; Future -     CULTURE, URINE COMPREHENSIVE; Future -     CT RENAL STONE STUDY; Future  Acute prostatitis -     Ciprofloxacin HCl; Take 1 tablet (500 mg total) by mouth 2 (two) times daily for 10 days.  Dispense: 20 tablet; Refill: 0     Follow-up: No follow-ups on file.  Sanda Linger, MD

## 2023-02-15 LAB — CULTURE, URINE COMPREHENSIVE: RESULT:: NO GROWTH

## 2023-02-16 ENCOUNTER — Ambulatory Visit: Payer: PPO | Admitting: Internal Medicine

## 2023-02-17 DIAGNOSIS — D4411 Neoplasm of uncertain behavior of right adrenal gland: Secondary | ICD-10-CM | POA: Diagnosis not present

## 2023-02-17 DIAGNOSIS — N202 Calculus of kidney with calculus of ureter: Secondary | ICD-10-CM | POA: Diagnosis not present

## 2023-02-18 ENCOUNTER — Other Ambulatory Visit (HOSPITAL_COMMUNITY): Payer: Self-pay

## 2023-02-18 ENCOUNTER — Other Ambulatory Visit: Payer: Self-pay | Admitting: Internal Medicine

## 2023-02-18 DIAGNOSIS — E785 Hyperlipidemia, unspecified: Secondary | ICD-10-CM

## 2023-02-19 ENCOUNTER — Other Ambulatory Visit (HOSPITAL_COMMUNITY): Payer: Self-pay

## 2023-02-19 ENCOUNTER — Other Ambulatory Visit: Payer: Self-pay

## 2023-02-19 MED ORDER — ATORVASTATIN CALCIUM 10 MG PO TABS
10.0000 mg | ORAL_TABLET | ORAL | 1 refills | Status: DC
  Filled 2023-02-19: qty 42, 98d supply, fill #0
  Filled 2023-06-10: qty 42, 98d supply, fill #1

## 2023-02-23 ENCOUNTER — Encounter: Payer: Self-pay | Admitting: Sports Medicine

## 2023-02-23 ENCOUNTER — Ambulatory Visit: Payer: PPO | Admitting: Sports Medicine

## 2023-02-23 ENCOUNTER — Other Ambulatory Visit (INDEPENDENT_AMBULATORY_CARE_PROVIDER_SITE_OTHER): Payer: PPO

## 2023-02-23 ENCOUNTER — Other Ambulatory Visit: Payer: Self-pay

## 2023-02-23 ENCOUNTER — Ambulatory Visit: Payer: PPO | Admitting: Internal Medicine

## 2023-02-23 DIAGNOSIS — H02834 Dermatochalasis of left upper eyelid: Secondary | ICD-10-CM | POA: Diagnosis not present

## 2023-02-23 DIAGNOSIS — M25412 Effusion, left shoulder: Secondary | ICD-10-CM | POA: Diagnosis not present

## 2023-02-23 DIAGNOSIS — H2512 Age-related nuclear cataract, left eye: Secondary | ICD-10-CM | POA: Diagnosis not present

## 2023-02-23 DIAGNOSIS — M7542 Impingement syndrome of left shoulder: Secondary | ICD-10-CM

## 2023-02-23 DIAGNOSIS — M7552 Bursitis of left shoulder: Secondary | ICD-10-CM | POA: Diagnosis not present

## 2023-02-23 DIAGNOSIS — G8929 Other chronic pain: Secondary | ICD-10-CM | POA: Diagnosis not present

## 2023-02-23 DIAGNOSIS — H40013 Open angle with borderline findings, low risk, bilateral: Secondary | ICD-10-CM | POA: Diagnosis not present

## 2023-02-23 DIAGNOSIS — M25512 Pain in left shoulder: Secondary | ICD-10-CM

## 2023-02-23 DIAGNOSIS — E119 Type 2 diabetes mellitus without complications: Secondary | ICD-10-CM | POA: Diagnosis not present

## 2023-02-23 DIAGNOSIS — H04123 Dry eye syndrome of bilateral lacrimal glands: Secondary | ICD-10-CM | POA: Diagnosis not present

## 2023-02-23 DIAGNOSIS — H25811 Combined forms of age-related cataract, right eye: Secondary | ICD-10-CM | POA: Diagnosis not present

## 2023-02-23 DIAGNOSIS — H02831 Dermatochalasis of right upper eyelid: Secondary | ICD-10-CM | POA: Diagnosis not present

## 2023-02-23 DIAGNOSIS — H5703 Miosis: Secondary | ICD-10-CM | POA: Diagnosis not present

## 2023-02-23 LAB — HM DIABETES EYE EXAM

## 2023-02-23 MED ORDER — BUPIVACAINE HCL 0.25 % IJ SOLN
2.0000 mL | INTRAMUSCULAR | Status: AC | PRN
Start: 2023-02-23 — End: 2023-02-23
  Administered 2023-02-23: 2 mL via INTRA_ARTICULAR

## 2023-02-23 MED ORDER — METHYLPREDNISOLONE ACETATE 40 MG/ML IJ SUSP
40.0000 mg | INTRAMUSCULAR | Status: AC | PRN
Start: 2023-02-23 — End: 2023-02-23
  Administered 2023-02-23: 40 mg via INTRA_ARTICULAR

## 2023-02-23 MED ORDER — LIDOCAINE HCL 1 % IJ SOLN
1.0000 mL | INTRAMUSCULAR | Status: AC | PRN
Start: 2023-02-23 — End: 2023-02-23
  Administered 2023-02-23: 1 mL

## 2023-02-23 NOTE — Progress Notes (Signed)
 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  Castin initially started noticing clicking in the shoulder which he thought was over the bicep area, this has stopped more so over the anterior aspect of the shoulder and has gone laterally.  He notices this clicking sensation with abduction.  Has gone on for a while now, he did have 1 session of physical therapy and did have Thera-Band's but he has stopped on this until he found out the exact diagnosis. Takes over-the-counter anti-inflammatories only as needed but nothing consistently.  Does have the clicking/popping sensation but does not always cause pain.  Pertinent ROS were reviewed with the patient and found to be negative unless otherwise specified above in HPI.   Assessment & Plan: Visit Diagnoses:  1. Impingement syndrome of left shoulder   2. Subacromial bursitis of left shoulder joint   3. Chronic left shoulder pain   4. Shoulder effusion, left    Plan: Arjun does have some x-ray and physical exam findings of subacromial shoulder impingement.  His ultrasound shows a moderate subacromial bursitis/effusion which I think is predisposing him to impingement as well as a degree of bicipital subluxation which was seen under dynamic views. It is also possible he has ligamental disruption that is causing the bicep tendon to sublux in and out of the groove.  Through shared decision making, we did proceed with US -guided subacromial joint aspiration which yielded about 13 cc of fluid, followed by subacromial joint corticosteroid injection.  I did advise him on rest and activity modification for the next 4-5 days, following  this I do think it would be smart to get him into formalized physical therapy to work on more isometric and stabilization/impingement exercises.  I want him to avoid excessive range of motion for the next few weeks.  He may use ice/heat or over-the-counter anti-inflammatories only as needed for postinjection pain.  He will keep me updated here over the next few weeks how he is doing.  Follow-up: Return in about 3 weeks (around 03/16/2023), or if symptoms worsen or fail to improve, for will update/message me.   Meds & Orders: No orders of the defined types were placed in this encounter.   Orders Placed This Encounter  Procedures   Large Joint Inj: L subacromial bursa   US  Extrem Up Left Ltd   XR Shoulder Left   Ambulatory referral to Physical Therapy     Procedures: Large Joint Inj: L subacromial bursa on 02/23/2023 1:15 PM Indications: pain and joint swelling Details: 18 G 1.5 in needle, ultrasound-guided anterolateral approach Medications: 1 mL lidocaine  1 %; 2 mL bupivacaine  0.25 %; 40 mg methylPREDNISolone  acetate 40 MG/ML Aspirate: 13 mL yellow, blood-tinged and clear Outcome: tolerated well, no immediate complications  *Procedurally-sound ultrasound-guided subacromial joint aspiration and subsequent injection. Procedure, treatment alternatives, risks and benefits explained, specific risks discussed. Consent was given by the patient. Immediately prior to procedure a time out was called to verify the correct patient, procedure, equipment, support staff and site/side marked as required. Patient was prepped and draped in the usual sterile fashion.  Clinical History:   He reports that he has never smoked. He has never been exposed to tobacco smoke. He has never used smokeless tobacco.  Recent Labs    08/12/22 1715 02/13/23 0927  HGBA1C 6.6* 6.3    Objective:    Physical Exam  Gen: Well-appearing, in no acute distress; non-toxic CV: Well-perfused. Warm.  Resp:  Breathing unlabored on room air; no wheezing. Psych: Fluid speech in conversation; appropriate affect; normal thought process  Ortho Exam - Left shoulder: There is a small to moderate subacromial joint effusion.  No specific bony tenderness.  There is a reproducible clicking with lateral abduction.  There is pain with resisted abduction, empty can testing although strength is relatively well-preserved.  About 5 degrees less of forward flexion and external rotation of the left compared to the right, otherwise equivocal range of motion.  Imaging: US  Extrem Up Left Ltd Result Date: 02/23/2023 Limited MSK ultrasound of the left upper extremity, left shoulder was performed today.  Evaluation of the bicep tendon and short axis shows there is actually superior migration of the bicep tendon out of the bicipital groove.  With dynamic scanning this does appear to sublux in and out.  There is tenosynovitis around the bicep tendon that does extend and is contiguous with the subacromial bursa.  There is notable subacromial bursitis with some hyperemia around this location.  The subscapularis tendon does show some moderate tendinosis without full-thickness tearing.  The supraspinatus has limited visualization secondary to the subacromial bursitis.  The infraspinatus has a calcification at the insertion near the humerus with tendinopathy, likely indicative of chronic tearing without tendon retraction however.   Dynamic biceps subluxation with moderate subacromial bursitis  XR Shoulder Left Result Date: 02/23/2023 3 views of the left shoulder including AP, scapular Y, axial view were ordered and reviewed by myself.  X-rays show a humeral head well located within the glenohumeral joint.  There are mild age-appropriate arthritic changes about the shoulder with mild to moderate AC joint arthropathy.  There is early spurring off the greater tuberosity, indicative of likely impingement syndrome.  No acute fracture or  otherwise bony abnormality noted.      Past Medical/Family/Surgical/Social History: Medications & Allergies reviewed per EMR, new medications updated. Patient Active Problem List   Diagnosis Date Noted   Gross hematuria 02/13/2023   Acute prostatitis 02/13/2023   Right adrenal mass (HCC) 02/13/2023   Left renal stone 02/13/2023   High serum lipoprotein(a) 09/05/2022   Encounter for general adult medical examination with abnormal findings 06/12/2021   Carpal tunnel syndrome, left upper limb 11/28/2020   Carpal tunnel syndrome, right upper limb 11/28/2020   Erectile dysfunction due to arterial insufficiency 09/24/2020   Benign prostatic hyperplasia with weak urinary stream 05/05/2018   Hyperlipidemia LDL goal <100 05/05/2018   Pure hyperglyceridemia 05/05/2018   Psychophysiological insomnia 05/05/2018   Lumbar radiculopathy 07/15/2017   Hyperlipidemia associated with type 2 diabetes mellitus (HCC) 06/10/2017   Essential hypertension 06/10/2017   Type 2 diabetes mellitus without complication, without long-term current use of insulin  (HCC) 06/10/2017   Spondylolisthesis, lumbar region 04/21/2017   Past Medical History:  Diagnosis Date   Arthritis    Diabetes mellitus without complication (HCC)    type 2; tx metformin  and wkly ozempic  on Sundays   Hair loss    History of kidney stones    surgery to remove   Hyperlipidemia    tx w/atorvastatin    Hypertension    tx w/lisinopril    Insomnia  tx w/ambien    Family History  Problem Relation Age of Onset   Cancer Mother    Diabetes Mother    Mental illness Mother    Heart disease Father    Arthritis Brother    Past Surgical History:  Procedure Laterality Date   APPENDECTOMY     COLONOSCOPY     I & D KNEE WITH POLY EXCHANGE Right 01/24/2022   Procedure: IRRIGATION AND DEBRIDEMENT RIGHT KNEE WITH POLY EXCHANGE;  Surgeon: Harden Jerona GAILS, MD;  Location: [facility];  Service: Orthopedics;  Laterality: Right;   LITHOTRIPSY      MINOR CARPAL TUNNEL Left 11/07/2022   Procedure: MINOR CARPAL TUNNEL;  Surgeon: Arlinda Buster, MD;  Location: [facility];  Service: Orthopedics;  Laterality: Left;  NEEDS RNFA (OPEN)   REPLACEMENT TOTAL KNEE Right    TOTAL KNEE REVISION Right 04/22/2017   Procedure: REVISION RIGHT TOTAL KNEE ARTHROPLASTY VS. POLY EXCHANGE;  Surgeon: Harden Jerona GAILS, MD;  Location: [facility];  Service: Orthopedics;  Laterality: Right;   Social History   Occupational History   Occupation: Part time  Tobacco Use   Smoking status: Never    Passive exposure: Never   Smokeless tobacco: Never  Vaping Use   Vaping status: Never Used  Substance and Sexual Activity   Alcohol use: Yes    Alcohol/week: 7.0 standard drinks of alcohol    Types: 7 Glasses of wine per week    Comment: wine   Drug use: No   Sexual activity: Not Currently    Partners: Male

## 2023-02-23 NOTE — Progress Notes (Signed)
 Patient says that he was having clicking over the biceps tendon, but this clicking has stopped. He now feels clicking in the shoulder itself with lateral abduction. He says that it has been going on for awhile, and he has been holding off on physical therapy or exercises until getting the clicking figured out. He has had pain in the shoulder, but the clicking itself does not always produce pain.

## 2023-03-02 ENCOUNTER — Other Ambulatory Visit (HOSPITAL_COMMUNITY): Payer: Self-pay

## 2023-03-02 ENCOUNTER — Ambulatory Visit: Payer: PPO | Admitting: Physical Therapy

## 2023-03-02 ENCOUNTER — Other Ambulatory Visit: Payer: Self-pay | Admitting: Internal Medicine

## 2023-03-02 ENCOUNTER — Other Ambulatory Visit: Payer: Self-pay

## 2023-03-02 ENCOUNTER — Other Ambulatory Visit: Payer: Self-pay | Admitting: Sports Medicine

## 2023-03-02 ENCOUNTER — Encounter: Payer: Self-pay | Admitting: Physical Therapy

## 2023-03-02 DIAGNOSIS — M25512 Pain in left shoulder: Secondary | ICD-10-CM

## 2023-03-02 DIAGNOSIS — M6281 Muscle weakness (generalized): Secondary | ICD-10-CM

## 2023-03-02 DIAGNOSIS — G5601 Carpal tunnel syndrome, right upper limb: Secondary | ICD-10-CM

## 2023-03-02 DIAGNOSIS — R293 Abnormal posture: Secondary | ICD-10-CM

## 2023-03-02 DIAGNOSIS — E119 Type 2 diabetes mellitus without complications: Secondary | ICD-10-CM

## 2023-03-02 DIAGNOSIS — G5602 Carpal tunnel syndrome, left upper limb: Secondary | ICD-10-CM

## 2023-03-02 MED ORDER — METFORMIN HCL ER 500 MG PO TB24
500.0000 mg | ORAL_TABLET | Freq: Every day | ORAL | 0 refills | Status: DC
  Filled 2023-03-02: qty 90, 90d supply, fill #0

## 2023-03-02 NOTE — Therapy (Signed)
 OUTPATIENT PHYSICAL THERAPY UPPER EXTREMITY EVALUATION   Patient Name: Adam Ware, Dr. 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 DOB:1950-03-10, 73 y.o., male Today's Date: 03/02/2023  END OF SESSION:  PT End of Session - 03/02/23 0900     Visit Number 1    Number of Visits 5    Date for PT Re-Evaluation 04/16/23    Authorization Type [employer]    PT Start Time 0800    PT Stop Time 0848    PT Time Calculation (min) 48 min    Activity Tolerance Patient tolerated treatment well    Behavior During Therapy WFL for tasks assessed/performed             Past Medical History:  Diagnosis Date   Arthritis    Diabetes mellitus without complication (HCC)    type 2; tx metformin  and wkly ozempic  on Sundays   Hair loss    History of kidney stones    surgery to remove   Hyperlipidemia    tx w/atorvastatin    Hypertension    tx w/lisinopril    Insomnia    tx w/ambien    Past Surgical History:  Procedure Laterality Date   APPENDECTOMY     COLONOSCOPY     I & D KNEE WITH POLY EXCHANGE Right 01/24/2022   Procedure: IRRIGATION AND DEBRIDEMENT RIGHT KNEE WITH POLY EXCHANGE;  Surgeon: Harden Jerona GAILS, MD;  Location: [facility];  Service: Orthopedics;  Laterality: Right;   LITHOTRIPSY     MINOR CARPAL TUNNEL Left 11/07/2022   Procedure: MINOR CARPAL TUNNEL;  Surgeon: Arlinda Buster, MD;  Location: [facility];  Service: Orthopedics;  Laterality: Left;  NEEDS RNFA (OPEN)   REPLACEMENT TOTAL KNEE Right    TOTAL KNEE REVISION Right 04/22/2017   Procedure: REVISION RIGHT TOTAL KNEE ARTHROPLASTY VS. POLY EXCHANGE;  Surgeon: Harden Jerona GAILS, MD;  Location: [facility];  Service: Orthopedics;  Laterality: Right;   Patient Active Problem List   Diagnosis Date Noted   Gross hematuria 02/13/2023   Acute prostatitis 02/13/2023   Right adrenal mass (HCC) 02/13/2023   Left renal stone 02/13/2023   High serum lipoprotein(a) 09/05/2022   Encounter for general adult medical examination with abnormal  findings 06/12/2021   Carpal tunnel syndrome, left upper limb 11/28/2020   Carpal tunnel syndrome, right upper limb 11/28/2020   Erectile dysfunction due to arterial insufficiency 09/24/2020   Benign prostatic hyperplasia with weak urinary stream 05/05/2018   Hyperlipidemia LDL goal <100 05/05/2018   Pure hyperglyceridemia 05/05/2018   Psychophysiological insomnia 05/05/2018   Lumbar radiculopathy 07/15/2017   Hyperlipidemia associated with type 2 diabetes mellitus (HCC) 06/10/2017   Essential hypertension 06/10/2017   Type 2 diabetes mellitus without complication, without long-term current use of insulin  (HCC) 06/10/2017   Spondylolisthesis, lumbar region 04/21/2017    PCP: Joshua Debby CROME, MD  REFERRING PROVIDER: Burnetta Brunet, DO  REFERRING DIAG:  [phone] (ICD-10-CM) - Subacromial bursitis of left shoulder joint  M75.42 (ICD-10-CM) - Impingement syndrome of left shoulder  M25.512,G89.29 (ICD-10-CM) - Chronic left shoulder pain    THERAPY DIAG:  No diagnosis found.  Rationale for Evaluation and Treatment: Rehabilitation  ONSET DATE: 02/23/2023  MD referral to PT  SUBJECTIVE:  SUBJECTIVE STATEMENT: Patient reports left shoulder clicking.  Dr. Bobie office visit on 02/23/2023 diagnosed subacromial shoulder impingement with bursitis & bicipital subluxation. Dr. Burnetta referred to PT for isometric and stabilization / impingement exercises (avoid excessive ROM for next few weeks). Dr. Burnetta removed 13 cc of fluid from bursa & injection which has quieted shoulder down.  Hand dominance: Left  PERTINENT HISTORY: LUE minor carpal tunnel surgery 11/07/2022, right TKA with revision 2019 & ID 01/24/22, arthritis, DM2, HTN, HLD, Spondylolisthesis lumbar  PAIN:  Are you having pain? Yes: NPRS scale: today 0/10 and in  last week up to 1-2/10  Pain location: left shoulder in joint Pain description: quick sharp Aggravating factors: end range IR & ER like putting coat on Relieving factors: got out of stretch  PRECAUTIONS: None  RED FLAGS: None   WEIGHT BEARING RESTRICTIONS: No  FALLS:  Has patient fallen in last 6 months? No  LIVING ENVIRONMENT: Lives with: lives with their family Lives in: House Stairs: Yes: External: 2 steps; none  OCCUPATION: Doctor   PLOF: Independent  PATIENT GOALS: back to normal full range pain free  NEXT MD VISIT: prn  OBJECTIVE:  Note: Objective measures were completed at Evaluation unless otherwise noted.  DIAGNOSTIC FINDINGS:  02/23/23  Xray 3 views of the left shoulder including AP, scapular Y, axial view were ordered and reviewed by myself.  X-rays show a humeral head well located within the glenohumeral joint.  There are mild age-appropriate arthritic changes about the shoulder with mild to moderate AC joint arthropathy.  There is early spurring off the greater tuberosity, indicative of likely impingement syndrome.  No acute fracture or otherwise bony abnormality noted. US  shows - Limited MSK ultrasound of the left upper extremity, left shoulder was performed today.  Evaluation of the bicep tendon and short axis shows  there is actually superior migration of the bicep tendon out of the  bicipital groove.  With dynamic scanning this does appear to sublux in and  out.  There is tenosynovitis around the bicep tendon that does extend and  is contiguous with the subacromial bursa.  There is notable subacromial  bursitis with some hyperemia around this location.  The subscapularis  tendon does show some moderate tendinosis without full-thickness tearing.   The supraspinatus has limited visualization secondary to the subacromial  bursitis.  The infraspinatus has a calcification at the insertion near the  humerus with tendinopathy, likely indicative of chronic  tearing without  tendon retraction however.  PATIENT SURVEYS :  Patient declined FOTO  COGNITION: Overall cognitive status: Within functional limits for tasks assessed     SENSATION: WFL  POSTURE: 1in forward head posture  UPPER EXTREMITY ROM:    ROM Right eval Left eval  Shoulder flexion  A:171 with clicking with quick catch at end range initiating eccentric  Shoulder extension    Shoulder abduction  A:85  Shoulder adduction    Shoulder internal rotation  Supine A:60  Shoulder external rotation  Supine A:68  Elbow flexion    Elbow extension    Wrist flexion    Wrist extension    Wrist ulnar deviation    Wrist radial deviation    Wrist pronation    Wrist supination    (Blank rows = not tested)  UPPER EXTREMITY MMT:  MMT Right eval Left eval  Shoulder flexion    Shoulder extension    Shoulder abduction    Shoulder adduction    Shoulder internal rotation    Shoulder external  rotation    Middle trapezius    Lower trapezius    Elbow flexion    Elbow extension    Wrist flexion    Wrist extension    Wrist ulnar deviation    Wrist radial deviation    Wrist pronation    Wrist supination    Grip strength (lbs)    (Blank rows = not tested)  PALPATION:  Eval:  PT noted click left shoulder with start of eccentric especially end ranges.  Tenderness / quick pain with external rotation with elbow extended (biceps on stretch) which was not present when elbow was flexed.     TODAY'S TREATMENT:                                                                                                       DATE: 03/02/2023: Therex: HEP instruction/performance c cues for techniques, handout provided.  Trial set performed of each for comprehension and symptom assessment.  PT made patient aware of upper body posture especially head position prior to each exercise.  See below for exercise list   PATIENT EDUCATION: Education details: HEP, POC Person educated: Patient Education  method: Explanation, Demonstration, Verbal cues, and Handouts Education comprehension: verbalized understanding, returned demonstration, and verbal cues required  HOME EXERCISE PROGRAM: Access Code: [email or web address] URL: [email or web address] Date: 03/02/2023 Prepared by: Grayce Spatz  Exercises - Supine Cervical Retraction with Towel  - 2 x daily - 7 x weekly - 2 sets - 10 reps - 5 seconds hold - Isometric Shoulder Flexion at Wall  - 1 x daily - 7 x weekly - 2-3 sets - 10 reps - 5 seconds hold - Isometric Shoulder Abduction at Wall  - 1 x daily - 7 x weekly - 2-3 sets - 10 reps - 5 seconds hold - Isometric Shoulder Adduction  - 1 x daily - 7 x weekly - 2-3 sets - 10 reps - 5 seconds hold - Standing Isometric Shoulder Internal Rotation at Doorway  - 1 x daily - 7 x weekly - 2-3 sets - 10 reps - 5 seconds hold - Standing Isometric Shoulder External Rotation with Doorway  - 1 x daily - 7 x weekly - 2-3 sets - 10 reps - 5 seconds hold - Standing Isometric Shoulder Extension with Doorway - Arm Bent  - 1 x daily - 7 x weekly - 2-3 sets - 10 reps - 5 seconds hold  ASSESSMENT:  CLINICAL IMPRESSION: Patient is a 73 y.o. who comes to clinic with complaints of left shoulder pain with mobility, strength and movement coordination deficits that impair their ability to perform usual daily and recreational functional activities without increase difficulty/symptoms at this time.  Patient to benefit from skilled PT services to address impairments and limitations to improve to previous level of function without restriction secondary to condition.   OBJECTIVE IMPAIRMENTS: decreased activity tolerance, decreased coordination, decreased knowledge of condition, decreased strength, impaired UE functional use, and pain.   ACTIVITY LIMITATIONS: carrying, lifting, and reach over head  PARTICIPATION LIMITATIONS: occupation and recreation (golf)  PERSONAL FACTORS: 1-2 comorbidities: see  PMH  are also  affecting patient's functional outcome.   REHAB POTENTIAL: Good  CLINICAL DECISION MAKING: Stable/uncomplicated  EVALUATION COMPLEXITY: Low   GOALS: Goals reviewed with patient? Yes  SHORT TERM GOALS:  same as LTGs  LONG TERM GOALS: (target dates for all long term goals are 4 visits after eval by 2/272/25 )   1. Patient will demonstrate/report pain at worst less than or equal to 2/10 to facilitate minimal limitation in daily activity secondary to pain symptoms. Goal status: New   2. Patient will demonstrate independent use of home exercise program to facilitate ability to maintain/progress functional gains from skilled physical therapy services. Goal status: New   3. Patient reports no issues with dominant left shoulder use with ADLs and golf Goal status: New   4.  Patient will demonstrate ;left shoulder UE MMT 5/5 throughout to facilitate lifting, reaching, carrying at [facility] in daily activity.   Goal status: New   5.  Patient will demonstrate left GH joint AROM WFL s symptoms to facilitate usual overhead reaching, self care, dressing at PLOF.    Goal status: New     PLAN: PT FREQUENCY: 1x/week  PT DURATION: 4 weeks  PLANNED INTERVENTIONS: [id number]- PT Re-evaluation, 97110-Therapeutic exercises, 97530- Therapeutic activity, [id number]- Neuromuscular re-education, 97535- Self Care, [id number]- Manual therapy, [id number]- Aquatic Therapy, [phone]- Electrical stimulation (manual), Patient/Family education, Taping, Dry Needling, Joint mobilization, Cryotherapy, and Moist heat  PLAN FOR NEXT SESSION: check and progress HEP.  Include prone scapula strengthening.    Patient would benefit from OT evaluation as his left hand weakness onset 2 months after carpal tunnel surgery.    Grayce Spatz, PT, DPT 03/02/2023, 10:23 AM

## 2023-03-03 ENCOUNTER — Other Ambulatory Visit: Payer: Self-pay | Admitting: Internal Medicine

## 2023-03-03 ENCOUNTER — Other Ambulatory Visit: Payer: Self-pay

## 2023-03-03 ENCOUNTER — Other Ambulatory Visit (HOSPITAL_COMMUNITY): Payer: Self-pay | Admitting: Urology

## 2023-03-03 DIAGNOSIS — N5201 Erectile dysfunction due to arterial insufficiency: Secondary | ICD-10-CM

## 2023-03-03 DIAGNOSIS — N200 Calculus of kidney: Secondary | ICD-10-CM

## 2023-03-03 MED ORDER — TADALAFIL 20 MG PO TABS: 20.0000 mg | ORAL_TABLET | Freq: Every day | ORAL | 1 refills | Status: DC | PRN

## 2023-03-06 ENCOUNTER — Other Ambulatory Visit: Payer: Self-pay

## 2023-03-06 ENCOUNTER — Other Ambulatory Visit (HOSPITAL_COMMUNITY): Payer: Self-pay

## 2023-03-06 DIAGNOSIS — D4411 Neoplasm of uncertain behavior of right adrenal gland: Secondary | ICD-10-CM | POA: Diagnosis not present

## 2023-03-06 MED ORDER — DEXAMETHASONE 1 MG PO TABS
ORAL_TABLET | ORAL | 0 refills | Status: DC
  Filled 2023-03-06: qty 1, 1d supply, fill #0

## 2023-03-09 ENCOUNTER — Ambulatory Visit: Payer: PPO | Admitting: Physical Therapy

## 2023-03-09 ENCOUNTER — Encounter: Payer: Self-pay | Admitting: Physical Therapy

## 2023-03-09 ENCOUNTER — Telehealth: Payer: Self-pay | Admitting: Family Medicine

## 2023-03-09 DIAGNOSIS — R293 Abnormal posture: Secondary | ICD-10-CM | POA: Diagnosis not present

## 2023-03-09 DIAGNOSIS — M25511 Pain in right shoulder: Secondary | ICD-10-CM | POA: Diagnosis not present

## 2023-03-09 DIAGNOSIS — M6281 Muscle weakness (generalized): Secondary | ICD-10-CM | POA: Diagnosis not present

## 2023-03-09 DIAGNOSIS — G8929 Other chronic pain: Secondary | ICD-10-CM

## 2023-03-09 DIAGNOSIS — M25512 Pain in left shoulder: Secondary | ICD-10-CM

## 2023-03-09 NOTE — Telephone Encounter (Signed)
Pt requested labs for upcoming surgery.  None posted yet.  No DST labs drawn.  Pt advised.

## 2023-03-09 NOTE — Therapy (Signed)
OUTPATIENT PHYSICAL THERAPY UPPER EXTREMITY  Patient Name: Adam Ware, Dr. MRN: [id number] DOB:07-13-1950, 73 y.o., male Today's Date: 03/09/2023  END OF SESSION:  PT End of Session - 03/09/23 1511     Visit Number 2    Number of Visits 5    Date for PT Re-Evaluation 04/16/23    Authorization Type [employer]    Authorization Time Period 01/23/23 to 01/23/23    PT Start Time 1515    PT Stop Time 1603    PT Time Calculation (min) 48 min    Activity Tolerance Patient tolerated treatment well    Behavior During Therapy [facility] for tasks assessed/performed             Past Medical History:  Diagnosis Date   Arthritis    Diabetes mellitus without complication (HCC)    type 2; tx metformin and wkly ozempic on Sundays   Hair loss    History of kidney stones    surgery to remove   Hyperlipidemia    tx w/atorvastatin   Hypertension    tx w/lisinopril   Insomnia    tx w/ambien   Past Surgical History:  Procedure Laterality Date   APPENDECTOMY     COLONOSCOPY     I & D KNEE WITH POLY EXCHANGE Right 01/24/2022   Procedure: IRRIGATION AND DEBRIDEMENT RIGHT KNEE WITH POLY EXCHANGE;  Surgeon: Nadara Mustard, MD;  Location: [facility] OR;  Service: Orthopedics;  Laterality: Right;   LITHOTRIPSY     MINOR CARPAL TUNNEL Left 11/07/2022   Procedure: MINOR CARPAL TUNNEL;  Surgeon: Samuella Cota, MD;  Location: [facility];  Service: Orthopedics;  Laterality: Left;  NEEDS RNFA (OPEN)   REPLACEMENT TOTAL KNEE Right    TOTAL KNEE REVISION Right 04/22/2017   Procedure: REVISION RIGHT TOTAL KNEE ARTHROPLASTY VS. POLY EXCHANGE;  Surgeon: Nadara Mustard, MD;  Location: [facility] OR;  Service: Orthopedics;  Laterality: Right;   Patient Active Problem List   Diagnosis Date Noted   Gross hematuria 02/13/2023   Acute prostatitis 02/13/2023   Right adrenal mass (HCC) 02/13/2023   Left renal stone 02/13/2023   High serum lipoprotein(a) 09/05/2022   Encounter for general adult  medical examination with abnormal findings 06/12/2021   Carpal tunnel syndrome, left upper limb 11/28/2020   Carpal tunnel syndrome, right upper limb 11/28/2020   Erectile dysfunction due to arterial insufficiency 09/24/2020   Benign prostatic hyperplasia with weak urinary stream 05/05/2018   Hyperlipidemia LDL goal <100 05/05/2018   Pure hyperglyceridemia 05/05/2018   Psychophysiological insomnia 05/05/2018   Lumbar radiculopathy 07/15/2017   Hyperlipidemia associated with type 2 diabetes mellitus (HCC) 06/10/2017   Essential hypertension 06/10/2017   Type 2 diabetes mellitus without complication, without long-term current use of insulin (HCC) 06/10/2017   Spondylolisthesis, lumbar region 04/21/2017    PCP: Etta Grandchild, MD  REFERRING PROVIDER: Madelyn Brunner, DO  REFERRING DIAG:  [phone] (ICD-10-CM) - Subacromial bursitis of left shoulder joint  M75.42 (ICD-10-CM) - Impingement syndrome of left shoulder  M25.512,G89.29 (ICD-10-CM) - Chronic left shoulder pain    THERAPY DIAG:  No diagnosis found.  Rationale for Evaluation and Treatment: Rehabilitation  ONSET DATE: 02/23/2023  MD referral to PT  SUBJECTIVE:  SUBJECTIVE STATEMENT: Pt arriving today reporting 1/10 pain in his left shoulder. Pt stating compliance in his HEP and wanting to progress his strengthening program.   Eval: Patient reports left shoulder clicking.  Dr. Debbe Bales office visit on 02/23/2023 diagnosed subacromial shoulder impingement with bursitis & bicipital subluxation. Dr. Shon Baton referred to PT for isometric and stabilization / impingement exercises (avoid excessive ROM for next few weeks). Dr. Shon Baton removed 13 cc of fluid from bursa & injection which has quieted shoulder down.  Hand dominance: Left  PERTINENT HISTORY: LUE minor  carpal tunnel surgery 11/07/2022, right TKA with revision 2019 & ID 01/24/22, arthritis, DM2, HTN, HLD, Spondylolisthesis lumbar  PAIN:  Are you having pain? 1/10  Pain location: left shoulder in joint Pain description: quick sharp Aggravating factors: end range IR & ER like putting coat on Relieving factors: got out of stretch  PRECAUTIONS: None  RED FLAGS: None   WEIGHT BEARING RESTRICTIONS: No  FALLS:  Has patient fallen in last 6 months? No  LIVING ENVIRONMENT: Lives with: lives with their family Lives in: House Stairs: Yes: External: 2 steps; none  OCCUPATION: Doctor   PLOF: Independent  PATIENT GOALS: back to normal full range pain free  NEXT MD VISIT: prn  OBJECTIVE:  Note: Objective measures were completed at Evaluation unless otherwise noted.  DIAGNOSTIC FINDINGS:  02/23/23  Xray "3 views of the left shoulder including AP, scapular Y, axial view were ordered and reviewed by myself.  X-rays show a humeral head well located within the glenohumeral joint.  There are mild age-appropriate arthritic changes about the shoulder with mild to moderate AC joint arthropathy.  There is early spurring off the greater tuberosity, indicative of likely impingement syndrome.  No acute fracture or otherwise bony abnormality noted." US shows - "Limited MSK ultrasound of the left upper extremity, left shoulder was performed today.  Evaluation of the bicep tendon and short axis shows  there is actually superior migration of the bicep tendon out of the  bicipital groove.  With dynamic scanning this does appear to sublux in and  out.  There is tenosynovitis around the bicep tendon that does extend and  is contiguous with the subacromial bursa.  There is notable subacromial  bursitis with some hyperemia around this location.  The subscapularis  tendon does show some moderate tendinosis without full-thickness tearing.   The supraspinatus has limited visualization secondary to the subacromial   bursitis.  The infraspinatus has a calcification at the insertion near the  humerus with tendinopathy, likely indicative of chronic tearing without  tendon retraction however."  PATIENT SURVEYS :  Patient declined FOTO  COGNITION: Overall cognitive status: Within functional limits for tasks assessed     SENSATION: WFL  POSTURE: 1in forward head posture  UPPER EXTREMITY ROM:    ROM Right eval Left eval  Shoulder flexion  A:171 with clicking with quick catch at end range initiating eccentric  Shoulder extension    Shoulder abduction  A:85  Shoulder adduction    Shoulder internal rotation  Supine A:60  Shoulder external rotation  Supine A:68  Elbow flexion    Elbow extension    Wrist flexion    Wrist extension    Wrist ulnar deviation    Wrist radial deviation    Wrist pronation    Wrist supination    (Blank rows = not tested)  UPPER EXTREMITY MMT:  MMT Right eval Left eval  Shoulder flexion    Shoulder extension    Shoulder abduction  Shoulder adduction    Shoulder internal rotation    Shoulder external rotation    Middle trapezius    Lower trapezius    Elbow flexion    Elbow extension    Wrist flexion    Wrist extension    Wrist ulnar deviation    Wrist radial deviation    Wrist pronation    Wrist supination    Grip strength (lbs)    (Blank rows = not tested)  PALPATION:  Eval:  PT noted click left shoulder with start of eccentric especially end ranges.  Tenderness / quick pain with external rotation with elbow extended (biceps on stretch) which was not present when elbow was flexed.     TODAY'S TREATMENT:                                                                                                       DATE: 03/09/23: TherEx:  UBE: level 4 x 3 minutes each direction ER walk outs red TB x 10  ER walk outs green TB x 15  Scapular stabilization using 2# ball against wall circles both directions x 30 Supine shoulder at 90 deg flexion: 3 #  weight circles bil directions x 30 Prone: I's, T's, Y's and W's to pt's tolerance each x 15 holding 5 sec Seated bil ER using green TB x 20 holding 3 sec  Pt's HEP was updated and pt edu in progression     03/02/2023: Therex: HEP instruction/performance c cues for techniques, handout provided.  Trial set performed of each for comprehension and symptom assessment.  PT made patient aware of upper body posture especially head position prior to each exercise.  See below for exercise list   PATIENT EDUCATION: Education details: HEP, POC Person educated: Patient Education method: Explanation, Demonstration, Verbal cues, and Handouts Education comprehension: verbalized understanding, returned demonstration, and verbal cues required  HOME EXERCISE PROGRAM: Access Code: [email or web address] URL: [email or web address] Date: 03/02/2023 Prepared by: Vladimir Faster  Exercises - Supine Cervical Retraction with Towel  - 2 x daily - 7 x weekly - 2 sets - 10 reps - 5 seconds hold - Isometric Shoulder Flexion at Wall  - 1 x daily - 7 x weekly - 2-3 sets - 10 reps - 5 seconds hold - Isometric Shoulder Abduction at Wall  - 1 x daily - 7 x weekly - 2-3 sets - 10 reps - 5 seconds hold - Isometric Shoulder Adduction  - 1 x daily - 7 x weekly - 2-3 sets - 10 reps - 5 seconds hold - Standing Isometric Shoulder Internal Rotation at Doorway  - 1 x daily - 7 x weekly - 2-3 sets - 10 reps - 5 seconds hold - Standing Isometric Shoulder External Rotation with Doorway  - 1 x daily - 7 x weekly - 2-3 sets - 10 reps - 5 seconds hold - Standing Isometric Shoulder Extension with Doorway - Arm Bent  - 1 x daily - 7 x weekly - 2-3 sets - 10 reps - 5 seconds hold  ASSESSMENT:  CLINICAL IMPRESSION: Pt tolerating exercises  well with no reports of increased pain Pt did report catching sensation with abduction at mid range. Pt's posture was corrected and no more catching present. Continue skilled PT interventions per  pt's tolerance.     OBJECTIVE IMPAIRMENTS: decreased activity tolerance, decreased coordination, decreased knowledge of condition, decreased strength, impaired UE functional use, and pain.   ACTIVITY LIMITATIONS: carrying, lifting, and reach over head  PARTICIPATION LIMITATIONS: occupation and recreation (golf)  PERSONAL FACTORS: 1-2 comorbidities: see PMH  are also affecting patient's functional outcome.   REHAB POTENTIAL: Good  CLINICAL DECISION MAKING: Stable/uncomplicated  EVALUATION COMPLEXITY: Low   GOALS: Goals reviewed with patient? Yes  SHORT TERM GOALS:  same as LTGs  LONG TERM GOALS: (target dates for all long term goals are 4 visits after eval by 2/272/25 )   1. Patient will demonstrate/report pain at worst less than or equal to 2/10 to facilitate minimal limitation in daily activity secondary to pain symptoms. Goal status: on-going: 03/09/23   2. Patient will demonstrate independent use of home exercise program to facilitate ability to maintain/progress functional gains from skilled physical therapy services. Goal status: New   3. Patient reports no issues with dominant left shoulder use with ADLs and golf Goal status: New   4.  Patient will demonstrate ;left shoulder UE MMT 5/5 throughout to facilitate lifting, reaching, carrying at [facility] in daily activity.   Goal status: New   5.  Patient will demonstrate left GH joint AROM WFL s symptoms to facilitate usual overhead reaching, self care, dressing at PLOF.    Goal status: New     PLAN: PT FREQUENCY: 1x/week  PT DURATION: 4 weeks  PLANNED INTERVENTIONS: 97164- PT Re-evaluation, 97110-Therapeutic exercises, 97530- Therapeutic activity, [id number]- Neuromuscular re-education, 97535- Self Care, [id number]- Manual therapy, [id number]- Aquatic Therapy, [phone]- Electrical stimulation (manual), Patient/Family education, Taping, Dry Needling, Joint mobilization, Cryotherapy, and Moist heat  PLAN FOR NEXT SESSION: scapula  strengthening   Patient would benefit from OT evaluation as his left hand weakness onset 2 months after carpal tunnel surgery.    Sharmon Leyden, PT, MPT 03/09/2023, 4:22 PM

## 2023-03-10 ENCOUNTER — Other Ambulatory Visit: Payer: Self-pay | Admitting: Nurse Practitioner

## 2023-03-10 DIAGNOSIS — Z92241 Personal history of systemic steroid therapy: Secondary | ICD-10-CM

## 2023-03-10 NOTE — Progress Notes (Signed)
Dexamethasone suppression test required. Order placed for provider. Dexamethasone taken last night per patient.

## 2023-03-11 ENCOUNTER — Encounter: Payer: Self-pay | Admitting: Nurse Practitioner

## 2023-03-11 LAB — CORTISOL: Cortisol: 2.5 ug/dL — ABNORMAL LOW (ref 6.2–19.4)

## 2023-03-11 NOTE — Therapy (Signed)
OUTPATIENT OCCUPATIONAL THERAPY ORTHO EVALUATION  Patient Name: Adam Ware, Dr. MRN: [id number] DOB:1950/07/29, 73 y.o., male Today's Date: 03/13/2023  PCP: Sanda Linger, MD REFERRING PROVIDER:  Madelyn Brunner, DO    END OF SESSION:  OT End of Session - 03/13/23 0854     Visit Number 1    Number of Visits 10    Date for OT Re-Evaluation 05/08/23    Authorization Type [employer]    OT Start Time 0854    OT Stop Time 0945    OT Time Calculation (min) 51 min    Equipment Utilized During Treatment Pink putty    Activity Tolerance Patient tolerated treatment well;No increased pain;Patient limited by fatigue    Behavior During Therapy [facility] for tasks assessed/performed             Past Medical History:  Diagnosis Date   Arthritis    Diabetes mellitus without complication (HCC)    type 2; tx metformin and wkly ozempic on Sundays   Hair loss    History of kidney stones    surgery to remove   Hyperlipidemia    tx w/atorvastatin   Hypertension    tx w/lisinopril   Insomnia    tx w/ambien   Past Surgical History:  Procedure Laterality Date   APPENDECTOMY     COLONOSCOPY     I & D KNEE WITH POLY EXCHANGE Right 01/24/2022   Procedure: IRRIGATION AND DEBRIDEMENT RIGHT KNEE WITH POLY EXCHANGE;  Surgeon: Nadara Mustard, MD;  Location: [facility] OR;  Service: Orthopedics;  Laterality: Right;   LITHOTRIPSY     MINOR CARPAL TUNNEL Left 11/07/2022   Procedure: MINOR CARPAL TUNNEL;  Surgeon: Samuella Cota, MD;  Location: [facility];  Service: Orthopedics;  Laterality: Left;  NEEDS RNFA (OPEN)   REPLACEMENT TOTAL KNEE Right    TOTAL KNEE REVISION Right 04/22/2017   Procedure: REVISION RIGHT TOTAL KNEE ARTHROPLASTY VS. POLY EXCHANGE;  Surgeon: Nadara Mustard, MD;  Location: [facility] OR;  Service: Orthopedics;  Laterality: Right;   Patient Active Problem List   Diagnosis Date Noted   Gross hematuria 02/13/2023   Acute prostatitis 02/13/2023   Right adrenal mass (HCC)  02/13/2023   Left renal stone 02/13/2023   High serum lipoprotein(a) 09/05/2022   Encounter for general adult medical examination with abnormal findings 06/12/2021   Carpal tunnel syndrome, left upper limb 11/28/2020   Carpal tunnel syndrome, right upper limb 11/28/2020   Erectile dysfunction due to arterial insufficiency 09/24/2020   Benign prostatic hyperplasia with weak urinary stream 05/05/2018   Hyperlipidemia LDL goal <100 05/05/2018   Pure hyperglyceridemia 05/05/2018   Psychophysiological insomnia 05/05/2018   Lumbar radiculopathy 07/15/2017   Hyperlipidemia associated with type 2 diabetes mellitus (HCC) 06/10/2017   Essential hypertension 06/10/2017   Type 2 diabetes mellitus without complication, without long-term current use of insulin (HCC) 06/10/2017   Spondylolisthesis, lumbar region 04/21/2017    ONSET DATE: DOS 11/07/22  REFERRING DIAG:  G56.02 (ICD-10-CM) - Carpal tunnel syndrome, left upper limb  G56.01 (ICD-10-CM) - Carpal tunnel syndrome, right upper limb    THERAPY DIAG:  Muscle weakness (generalized)  Other lack of coordination  Paresthesia of skin  Rationale for Evaluation and Treatment: Rehabilitation  SUBJECTIVE:   SUBJECTIVE STATEMENT: He is now over 3 months post left CTR. He states that he has not been able to write and that it feels like he "has Parkinson's" after his carpal tunnel release.  He has had some shaking and  poor coordination though he states paresthesia is 3/10, states difficulty writing his name, and he's avoiding tasks with Lt dom hand.  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  Now having motor skill problems and still mild sensitivity deficits in the left dominant hand and arm.  PRECAUTIONS: None;  RED FLAGS: None ;  WEIGHT BEARING RESTRICTIONS: No  PAIN:  Are you having pain? No pain, just paresthesia  rates 3/10 (MF is the  worst)  FALLS: Has patient fallen in last 6 months? No  LIVING ENVIRONMENT: Lives with: lives alone Lives in: House/apartment Has following equipment at home: None  PLOF: Independent  PATIENT GOALS: Improve writing skills and use of left dominant hand  NEXT MD VISIT: As needed    OBJECTIVE: (All objective assessments below are from initial evaluation on: 03/13/23 unless otherwise specified.)   HAND DOMINANCE: Left   ADLs: Overall ADLs: States decreased ability to grab, hold household objects, pain and difficulty to open containers, perform FMS tasks (manipulate fasteners on clothing)   FUNCTIONAL OUTCOME MEASURES: Eval: Quick DASH 34% impairment today  (Higher % Score  =  More Impairment)     UPPER EXTREMITY ROM     Shoulder to Wrist AROM Left eval  Shoulder flexion   Shoulder abduction   Shoulder extension   Shoulder internal rotation   Shoulder external rotation   Elbow flexion   Elbow extension   Forearm supination 87 (80 Rt)  Forearm pronation  70 (75 Rt)   Wrist flexion 67 ( 51 Rt)   Wrist extension 59 ( 57 Rt)  Wrist ulnar deviation   Wrist radial deviation   Functional dart thrower's motion (F-DTM) in ulnar flexion   F-DTM in radial extension    (Blank rows = not tested)   Hand AROM Left eval  Full Fist Ability (or Gap to Distal Palmar Crease) Full loose fist (if sticks out)   Thumb Opposition  (Kapandji Scale)  6/10  Thumb MCP (0-60)   Thumb IP (0-80)   (Blank rows = not tested)   UPPER EXTREMITY MMT:     MMT Left 03/13/2023  Thumb IP flexion 3 -/5  Elbow flexion   Elbow extension   Forearm supination   Forearm pronation   Wrist flexion   Wrist extension   (Blank rows = not tested)  HAND FUNCTION: Eval: Observed weakness in affected Lt dom hand.  Grip strength Right: 100 lbs, Left: 68 lbs  Tip pinch Rt: 18#, Lt: 2#  COORDINATION: Eval: Observed coordination impairments with affected Lt hand. 9 Hole Peg Test Left: 30 sec (30 sec is  WFL, 22sec is AVG)   SENSATION: Eval:  Light touch intact today, though states diminished in Lt hand median nerve distribution S2PDT Rt IF: 5mm, Rt SF 5mm;   Lt IF 5mm, Lt SF 4mm  EDEMA:   Eval: None significant now though he states a swollen addendum is feeling in the left hand median nerve distribution  COGNITION: Eval: Overall cognitive status: WFL for evaluation today   OBSERVATIONS:   Eval:  scratch/collapse positive in Lt arm at pronator, lig of struthers and mildly at carpal tunnel; Rt side only positive at pronator; FPL and left hand very very weak  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  May have some shoulder or neck involvement as well. (C7 in the middle finger seems to be the worst of it).  Also has nonoperative carpal tunnel syndrome in the right arm that may benefit from OT.   TODAY'S TREATMENT:  Post-evaluation treatment:   OT educates on neuromuscular reeducation-the median nerve pathway, "double crush" phenomenon and how entrapment may be at the neck, ligament of Struthers, pronator group, and at the wrist.  OT educates to try to "not kink the hose" by applying pressure at the carpal tunnel or the forearm.  OT educates to try to avoid repetitive or straining motions as well.  Next, OT provides the following home exercise program to include vidian nerve glides for the arm and the carpal tunnel specifically, also stretches where tight especially the pronator group, as well as some stretches for the stiff thumb and strengthening that can be performed for the weak FPL and grasp.  These are talk to them quickly today as he is going out of town for vacation and OT was in a rush to show him everything.  They would likely need reviewed in upcoming sessions.  He is aware that nerves heal slowly and that this could take time to  improve.  Additionally, as his pronator group is tight and a site of entrapment, OT recommends manual therapy dry needling to the left pronator to help relieve tightness there and hopefully some of his paresthesia.  He gives consent for this, treatment was performed with no significant ill effects, but he also does not state a significant change in paresthesia immediately.  He leaves with no increase in tingling, no pain, no further questions for now.   Exercises - Median Nerve Flossing  - 2-3 x daily - 5-10 reps - Seated Median Nerve Glide  - 3-4 x daily - 5 reps - Forearm Supination Stretch  - 3-4 x daily - 3-5 reps - 15 sec hold - Seated Wrist Extension Stretch  - 3-6 x daily - 3-5 reps - 15 hold - Seated Composite Thumb Flexion PROM  - 2-3 x daily - 3-5 reps - 15 sec hold - HOOK Stretch  - 4 x daily - 3-5 reps - 15-20 sec hold - Thumb Press  - 2-3 x daily - 5 reps - Full Fist  - 2-3 x daily - 5 reps  Trigger Point Dry Needling  Initial Treatment: Pt instructed on Dry Needling rational, procedures, and possible side effects. Pt instructed to expect mild to moderate muscle soreness later in the day and/or into the next day.  Pt instructed to continue prescribed HEP. Patient verbalized understanding of these instructions and education.   Patient Verbal Consent Given: Yes Education Handout Provided: Yes Muscles Treated: Left arm pronator Electrical Stimulation Performed: No Treatment Response/Outcome: No bleeding slight muscle soreness, no immediate changes in sensation.    PATIENT EDUCATION: Education details: See tx section above for details  Person educated: Patient Education method: Verbal Instruction, Teach back, Handouts  Education comprehension: States and demonstrates understanding, Additional Education required    HOME EXERCISE PROGRAM: Access Code: [email or web address] URL: [email or web address] Date: 03/13/2023 Prepared by: Fannie Knee   GOALS: Goals  reviewed with patient? Yes   SHORT TERM GOALS: (STG required if POC>30 days) Target Date: 04/03/2023  Pt will obtain protective, custom orthotic. Goal status: TBD/PRN  2.  Pt will demo/state understanding of initial HEP to improve pain levels and prerequisite motion. Goal status: INITIAL   LONG TERM GOALS: Target Date: 05/08/2023  Pt  will improve functional ability by decreased impairment per Quick DASH assessment from 34% to 15% or better, for better quality of life. Goal status: INITIAL  2.  Pt will improve grip strength in left dominant hand from 68 lbs to at least 75 lbs for functional use at home and in IADLs. Goal status: INITIAL  3.  Pt will improve A/ROM in left thumb opposition from 6/10 to at least 8/10, to have functional motion for tasks like reach and grasp.  Goal status: INITIAL  4.  Pt will improve strength in left thumb FPL from 3 -/5 MMT to at least 4/5 MMT to have increased functional ability to carry out selfcare and higher-level homecare tasks with less difficulty. Goal status: INITIAL  5.  Pt will improve coordination skills in left dominant hand, as seen by improved score on nine-hole peg testing from 30 seconds to 25 seconds or better to have increased functional ability to carry out fine motor tasks (fasteners, etc.) and more complex, coordinated IADLs (meal prep, sports, etc.).  Goal status: INITIAL  6.  Pt will decrease paresthesia at rest from 3/10 to 1/10 or better to have better sleep and occupational participation in daily roles. Goal status: INITIAL   ASSESSMENT:  CLINICAL IMPRESSION: Patient is a 73 y.o. male who was seen today for occupational therapy evaluation for left dominant hand paresthesia, weakness in median nerve distribution now about 3 to 4 months postop left carpal tunnel release.  He will benefit from outpatient occupational therapy to decrease symptoms and increase strength and functional ability.   PERFORMANCE DEFICITS: in  functional skills including ADLs, IADLs, coordination, dexterity, proprioception, sensation, ROM, strength, flexibility, Fine motor control, body mechanics, endurance, and UE functional use, cognitive skills including perception, and psychosocial skills including coping strategies and habits.   IMPAIRMENTS: are limiting patient from ADLs, IADLs, work, and leisure.   COMORBIDITIES: may have co-morbidities  that affects occupational performance. Patient will benefit from skilled OT to address above impairments and improve overall function.  MODIFICATION OR ASSISTANCE TO COMPLETE EVALUATION: No modification of tasks or assist necessary to complete an evaluation.  OT OCCUPATIONAL PROFILE AND HISTORY: Problem focused assessment: Including review of records relating to presenting problem.  CLINICAL DECISION MAKING: Moderate - several treatment options, min-mod task modification necessary  REHAB POTENTIAL: Good  EVALUATION COMPLEXITY: Low      PLAN:  OT FREQUENCY: 1-2x/week  OT DURATION: 8 weeks up to 05/08/2023 and up to 10 total visits as needed  PLANNED INTERVENTIONS: 97168 OT Re-evaluation, 97535 self care/ADL training, 16109 therapeutic exercise, 97530 therapeutic activity, 97112 neuromuscular re-education, 97140 manual therapy, 97035 ultrasound, 97039 fluidotherapy, 97010 moist heat, 97010 cryotherapy, 97760 Orthotics management and training, 60454 Splinting (initial encounter), [id number] Subsequent splinting/medication, scar mobilization, Dry needling, energy conservation, coping strategies training, and patient/family education  RECOMMENDED OTHER SERVICES: None now-though he is currently in PT for his left shoulder  CONSULTED AND AGREED WITH PLAN OF CARE: Patient  PLAN FOR NEXT SESSION:   Review initial recommendations and home exercise program, continue manual therapy if helpful, try scar mobility and cupping.   Fannie Knee, OTR/L, CHT 03/13/2023, 10:51 AM

## 2023-03-13 ENCOUNTER — Ambulatory Visit: Payer: PPO | Admitting: Rehabilitative and Restorative Service Providers"

## 2023-03-13 ENCOUNTER — Encounter: Payer: Self-pay | Admitting: Rehabilitative and Restorative Service Providers"

## 2023-03-13 DIAGNOSIS — R202 Paresthesia of skin: Secondary | ICD-10-CM | POA: Diagnosis not present

## 2023-03-13 DIAGNOSIS — R278 Other lack of coordination: Secondary | ICD-10-CM

## 2023-03-13 DIAGNOSIS — M6281 Muscle weakness (generalized): Secondary | ICD-10-CM

## 2023-03-13 NOTE — Patient Instructions (Signed)

## 2023-03-16 ENCOUNTER — Other Ambulatory Visit: Payer: PPO

## 2023-03-16 ENCOUNTER — Ambulatory Visit: Payer: PPO | Admitting: Physical Therapy

## 2023-03-16 ENCOUNTER — Encounter: Payer: Self-pay | Admitting: Physical Therapy

## 2023-03-16 DIAGNOSIS — M6281 Muscle weakness (generalized): Secondary | ICD-10-CM | POA: Diagnosis not present

## 2023-03-16 DIAGNOSIS — M25512 Pain in left shoulder: Secondary | ICD-10-CM | POA: Diagnosis not present

## 2023-03-16 DIAGNOSIS — R29898 Other symptoms and signs involving the musculoskeletal system: Secondary | ICD-10-CM | POA: Diagnosis not present

## 2023-03-16 DIAGNOSIS — R293 Abnormal posture: Secondary | ICD-10-CM

## 2023-03-16 NOTE — Therapy (Signed)
OUTPATIENT PHYSICAL THERAPY UPPER EXTREMITY  Patient Name: Adam Ware, Dr. MRN: [id number] DOB:1950-08-03, 74 y.o., male Today's Date: 03/16/2023  END OF SESSION:  PT End of Session - 03/16/23 1144     Visit Number 3    Number of Visits 5    Date for PT Re-Evaluation 04/16/23    Authorization Type [employer]    Authorization Time Period 01/23/23 to 01/23/23    PT Start Time 1144    PT Stop Time 1228    PT Time Calculation (min) 44 min    Activity Tolerance Patient tolerated treatment well    Behavior During Therapy [facility] for tasks assessed/performed              Past Medical History:  Diagnosis Date   Arthritis    Diabetes mellitus without complication (HCC)    type 2; tx metformin and wkly ozempic on Sundays   Hair loss    History of kidney stones    surgery to remove   Hyperlipidemia    tx w/atorvastatin   Hypertension    tx w/lisinopril   Insomnia    tx w/ambien   Past Surgical History:  Procedure Laterality Date   APPENDECTOMY     COLONOSCOPY     I & D KNEE WITH POLY EXCHANGE Right 01/24/2022   Procedure: IRRIGATION AND DEBRIDEMENT RIGHT KNEE WITH POLY EXCHANGE;  Surgeon: Nadara Mustard, MD;  Location: [facility] OR;  Service: Orthopedics;  Laterality: Right;   LITHOTRIPSY     MINOR CARPAL TUNNEL Left 11/07/2022   Procedure: MINOR CARPAL TUNNEL;  Surgeon: Samuella Cota, MD;  Location: [facility];  Service: Orthopedics;  Laterality: Left;  NEEDS RNFA (OPEN)   REPLACEMENT TOTAL KNEE Right    TOTAL KNEE REVISION Right 04/22/2017   Procedure: REVISION RIGHT TOTAL KNEE ARTHROPLASTY VS. POLY EXCHANGE;  Surgeon: Nadara Mustard, MD;  Location: [facility] OR;  Service: Orthopedics;  Laterality: Right;   Patient Active Problem List   Diagnosis Date Noted   Gross hematuria 02/13/2023   Acute prostatitis 02/13/2023   Right adrenal mass (HCC) 02/13/2023   Left renal stone 02/13/2023   High serum lipoprotein(a) 09/05/2022   Encounter for general  adult medical examination with abnormal findings 06/12/2021   Carpal tunnel syndrome, left upper limb 11/28/2020   Carpal tunnel syndrome, right upper limb 11/28/2020   Erectile dysfunction due to arterial insufficiency 09/24/2020   Benign prostatic hyperplasia with weak urinary stream 05/05/2018   Hyperlipidemia LDL goal <100 05/05/2018   Pure hyperglyceridemia 05/05/2018   Psychophysiological insomnia 05/05/2018   Lumbar radiculopathy 07/15/2017   Hyperlipidemia associated with type 2 diabetes mellitus (HCC) 06/10/2017   Essential hypertension 06/10/2017   Type 2 diabetes mellitus without complication, without long-term current use of insulin (HCC) 06/10/2017   Spondylolisthesis, lumbar region 04/21/2017    PCP: Etta Grandchild, MD  REFERRING PROVIDER: Madelyn Brunner, DO  REFERRING DIAG:  [phone] (ICD-10-CM) - Subacromial bursitis of left shoulder joint  M75.42 (ICD-10-CM) - Impingement syndrome of left shoulder  M25.512,G89.29 (ICD-10-CM) - Chronic left shoulder pain    THERAPY DIAG:  No diagnosis found.  Rationale for Evaluation and Treatment: Rehabilitation  ONSET DATE: 02/23/2023  MD referral to PT  SUBJECTIVE:  SUBJECTIVE STATEMENT: His shoulder are better.  He can feel abduction the most still.  He went to driving range and hit balls for 30 min.    Eval: Patient reports left shoulder clicking.  Dr. Debbe Bales office visit on 02/23/2023 diagnosed subacromial shoulder impingement with bursitis & bicipital subluxation. Dr. Shon Baton referred to PT for isometric and stabilization / impingement exercises (avoid excessive ROM for next few weeks). Dr. Shon Baton removed 13 cc of fluid from bursa & injection which has quieted shoulder down.  Hand dominance: Left  PERTINENT HISTORY: LUE minor carpal tunnel surgery  11/07/2022, right TKA with revision 2019 & ID 01/24/22, arthritis, DM2, HTN, HLD, Spondylolisthesis lumbar  PAIN:  Are you having pain?  0/10 since last PT 1/10 Pain location: left shoulder in joint Pain description: quick sharp Aggravating factors: end range IR & ER like putting coat on Relieving factors: got out of stretch  PRECAUTIONS: None  RED FLAGS: None   WEIGHT BEARING RESTRICTIONS: No  FALLS:  Has patient fallen in last 6 months? No  LIVING ENVIRONMENT: Lives with: lives with their family Lives in: House Stairs: Yes: External: 2 steps; none  OCCUPATION: Doctor   PLOF: Independent  PATIENT GOALS: back to normal full range pain free  NEXT MD VISIT: prn  OBJECTIVE:  Note: Objective measures were completed at Evaluation unless otherwise noted.  DIAGNOSTIC FINDINGS:  02/23/23  Xray "3 views of the left shoulder including AP, scapular Y, axial view were ordered and reviewed by myself.  X-rays show a humeral head well located within the glenohumeral joint.  There are mild age-appropriate arthritic changes about the shoulder with mild to moderate AC joint arthropathy.  There is early spurring off the greater tuberosity, indicative of likely impingement syndrome.  No acute fracture or otherwise bony abnormality noted." US shows - "Limited MSK ultrasound of the left upper extremity, left shoulder was performed today.  Evaluation of the bicep tendon and short axis shows  there is actually superior migration of the bicep tendon out of the  bicipital groove.  With dynamic scanning this does appear to sublux in and  out.  There is tenosynovitis around the bicep tendon that does extend and  is contiguous with the subacromial bursa.  There is notable subacromial  bursitis with some hyperemia around this location.  The subscapularis  tendon does show some moderate tendinosis without full-thickness tearing.   The supraspinatus has limited visualization secondary to the subacromial   bursitis.  The infraspinatus has a calcification at the insertion near the  humerus with tendinopathy, likely indicative of chronic tearing without  tendon retraction however."  PATIENT SURVEYS :  Patient declined FOTO  COGNITION: Overall cognitive status: Within functional limits for tasks assessed     SENSATION: WFL  POSTURE: 1in forward head posture  UPPER EXTREMITY ROM:    ROM Right eval Left eval  Shoulder flexion  A:171 with clicking with quick catch at end range initiating eccentric  Shoulder extension    Shoulder abduction  A:85  Shoulder adduction    Shoulder internal rotation  Supine A:60  Shoulder external rotation  Supine A:68  Elbow flexion    Elbow extension    Wrist flexion    Wrist extension    Wrist ulnar deviation    Wrist radial deviation    Wrist pronation    Wrist supination    (Blank rows = not tested)  UPPER EXTREMITY MMT:  MMT Right eval Left eval  Shoulder flexion    Shoulder extension  Shoulder abduction    Shoulder adduction    Shoulder internal rotation    Shoulder external rotation    Middle trapezius    Lower trapezius    Elbow flexion    Elbow extension    Wrist flexion    Wrist extension    Wrist ulnar deviation    Wrist radial deviation    Wrist pronation    Wrist supination    Grip strength (lbs)    (Blank rows = not tested)  PALPATION:  Eval:  PT noted click left shoulder with start of eccentric especially end ranges.  Tenderness / quick pain with external rotation with elbow extended (biceps on stretch) which was not present when elbow was flexed.     TODAY'S TREATMENT:                                                                                                       DATE: 03/16/2023: TherEx:  UBE: seat 10 level 4 X 3 minutes each direction. Wall slides at door frame LLE with PT cues on form / technique for shoulder movement pattern.  Flexion 10 reps.  Scaption 10 reps. Reactive isometric stabilization  green theraband Left shoulder internal rotation, external rotation, flexion & extension 10 reps ea.  Verbal cues on upper body posture & towel roll under arm to minimize substitution.  PT added above to HEP with HO, demo & verbal cues. Pt verbalized and return demo understanding.   Therapeutic Activities:  PT demo & verbally cued patient on checking and setting posture between patients when working.  Pt verbalized understanding.  PT recommended setting up desk top computer with screen at eye level and keyboard so hand is ~in front of elbow.  Pt verbalized understanding.     TREATMENT:                                                                                                       DATE: 03/09/23: TherEx:  UBE: level 4 x 3 minutes each direction ER walk outs red TB x 10  ER walk outs green TB x 15  Scapular stabilization using 2# ball against wall circles both directions x 30 Supine shoulder at 90 deg flexion: 3 # weight circles bil directions x 30 Prone: I's, T's, Y's and W's to pt's tolerance each x 15 holding 5 sec Seated bil ER using green TB x 20 holding 3 sec  Pt's HEP was updated and pt edu in progression     03/02/2023: Therex: HEP instruction/performance c cues for techniques, handout provided.  Trial set performed of each for comprehension and symptom assessment.  PT made patient aware of  upper body posture especially head position prior to each exercise.  See below for exercise list   PATIENT EDUCATION: Education details: HEP, POC Person educated: Patient Education method: Explanation, Demonstration, Verbal cues, and Handouts Education comprehension: verbalized understanding, returned demonstration, and verbal cues required  HOME EXERCISE PROGRAM: Access Code: [email or web address] URL: [email or web address] Date: 03/16/2023 Prepared by: Vladimir Faster  Exercises - Supine Cervical Retraction with Towel  - 2 x daily - 7 x weekly - 2 sets - 10 reps - 5 seconds  hold - Isometric Shoulder Flexion at Wall  - 1 x daily - 7 x weekly - 2-3 sets - 10 reps - 5 seconds hold - Isometric Shoulder Abduction at Wall  - 1 x daily - 7 x weekly - 2-3 sets - 10 reps - 5 seconds hold - Standing Isometric Shoulder Extension with Doorway - Arm Bent  - 1 x daily - 7 x weekly - 2-3 sets - 10 reps - 5 seconds hold - Isometric Shoulder Adduction  - 1 x daily - 7 x weekly - 2-3 sets - 10 reps - 5 seconds hold - Standing Isometric Shoulder Internal Rotation at Doorway  - 1 x daily - 7 x weekly - 2-3 sets - 10 reps - 5 seconds hold - Standing Isometric Shoulder External Rotation with Doorway  - 1 x daily - 7 x weekly - 2-3 sets - 10 reps - 5 seconds hold - Shoulder External Rotation and Scapular Retraction with Resistance  - 1 x daily - 7 x weekly - 3 sets - 10 reps - Standing Shoulder Internal Rotation Stretch with Towel  - 1 x daily - 7 x weekly - 3 reps - 20 seconds hold - Standing Wall Ball Circles in Scaption with Plyo Ball  - 1 x daily - 7 x weekly - 3 sets - 10 reps - Prone Scapular Retraction Arms at Side  - 1 x daily - 7 x weekly - 2 sets - 10 reps - 10 seconds hold - Prone W Scapular Retraction  - 1 x daily - 7 x weekly - 2 sets - 10 reps - 10 seconds hold - Prone Single Arm Shoulder Y  - 1 x daily - 7 x weekly - 2 sets - 10 reps - 10 seconds hold - Standing Shoulder Flexion Wall Walk  - 1 x daily - 5-7 x weekly - 1-2 sets - 10 reps - 5 seconds hold - Standing Shoulder Scaption Wall Walk  - 1 x daily - 5-7 x weekly - 1-2 sets - 10 reps - 5 seconds hold - External Rotation Reactive Isometrics with Flex Bar  - 1 x daily - 5-7 x weekly - 1-2 sets - 10 reps - 5 seconds hold - walking balance with band to your left  - 1 x daily - 5-7 x weekly - 1-2 sets - 10 reps - 5 seconds hold - walking balance with band behind you  - 1 x daily - 5-7 x weekly - 1-2 sets - 10 reps - 5 seconds hold - Standing Shoulder Row Reactive Isometric  - 1 x daily - 5-7 x weekly - 1-2 sets - 10 reps - 5  seconds hold  ASSESSMENT:  CLINICAL IMPRESSION: Patient is reporting less shoulder pain with activities.  His posture appears improved and he has more awareness including relationship to shoulder.  PT began AROM with door frame for assisted control and reactive stabilization exercises which he appears to understand.    OBJECTIVE IMPAIRMENTS: decreased  activity tolerance, decreased coordination, decreased knowledge of condition, decreased strength, impaired UE functional use, and pain.   ACTIVITY LIMITATIONS: carrying, lifting, and reach over head  PARTICIPATION LIMITATIONS: occupation and recreation (golf)  PERSONAL FACTORS: 1-2 comorbidities: see PMH  are also affecting patient's functional outcome.   REHAB POTENTIAL: Good  CLINICAL DECISION MAKING: Stable/uncomplicated  EVALUATION COMPLEXITY: Low   GOALS: Goals reviewed with patient? Yes  SHORT TERM GOALS:  same as LTGs  LONG TERM GOALS: (target dates for all long term goals are 4 visits after eval by 2/272/25 )   1. Patient will demonstrate/report pain at worst less than or equal to 2/10 to facilitate minimal limitation in daily activity secondary to pain symptoms. Goal status: Ongoing 03/16/2023   2. Patient will demonstrate independent use of home exercise program to facilitate ability to maintain/progress functional gains from skilled physical therapy services. Goal status: Ongoing 03/16/2023   3. Patient reports no issues with dominant left shoulder use with ADLs and golf Goal status: Ongoing 03/16/2023   4.  Patient will demonstrate ;left shoulder UE MMT 5/5 throughout to facilitate lifting, reaching, carrying at [facility] in daily activity.   Goal status: Ongoing 03/16/2023   5.  Patient will demonstrate left GH joint AROM WFL s symptoms to facilitate usual overhead reaching, self care, dressing at PLOF.    Goal status: Ongoing 03/16/2023     PLAN: PT FREQUENCY: 1x/week  PT DURATION: 4 weeks  PLANNED INTERVENTIONS:  [id number]- PT Re-evaluation, 97110-Therapeutic exercises, 97530- Therapeutic activity, 97112- Neuromuscular re-education, 97535- Self Care, [id number]- Manual therapy, [id number]- Aquatic Therapy, [phone]- Electrical stimulation (manual), Patient/Family education, Taping, Dry Needling, Joint mobilization, Cryotherapy, and Moist heat  PLAN FOR NEXT SESSION: check HEP and reduce frequency as needed.     Vladimir Faster, PT, DPT 03/16/2023, 12:39 PM

## 2023-03-27 NOTE — Therapy (Signed)
 OUTPATIENT OCCUPATIONAL THERAPY TREATMENT NOTE  Patient Name: Adam Ware, Dr. MRN: [id number] DOB:[birth year], 73 y.o., male Today's Date: 03/30/2023  PCP: Sandra Crouch, MD REFERRING PROVIDER:  Shauna Del, DO    END OF SESSION:  OT End of Session - 03/30/23 1101     Visit Number 2    Number of Visits 10    Date for OT Re-Evaluation 05/08/23    Authorization Type [employer]    OT Start Time 1100    OT Stop Time 1147    OT Time Calculation (min) 47 min    Activity Tolerance Patient tolerated treatment well;No increased pain;Patient limited by fatigue    Behavior During Therapy [facility] for tasks assessed/performed              Past Medical History:  Diagnosis Date   Arthritis    Diabetes mellitus without complication (HCC)    type 2; tx metformin  and wkly ozempic  on Sundays   Hair loss    History of kidney stones    surgery to remove   Hyperlipidemia    tx w/atorvastatin    Hypertension    tx w/lisinopril    Insomnia    tx w/ambien    Past Surgical History:  Procedure Laterality Date   APPENDECTOMY     COLONOSCOPY     I & D KNEE WITH POLY EXCHANGE Right 01/24/2022   Procedure: IRRIGATION AND DEBRIDEMENT RIGHT KNEE WITH POLY EXCHANGE;  Surgeon: Timothy Ford, MD;  Location: [facility] OR;  Service: Orthopedics;  Laterality: Right;   LITHOTRIPSY     MINOR CARPAL TUNNEL Left 11/07/2022   Procedure: MINOR CARPAL TUNNEL;  Surgeon: Merrill Abide, MD;  Location: [facility];  Service: Orthopedics;  Laterality: Left;  NEEDS RNFA (OPEN)   REPLACEMENT TOTAL KNEE Right    TOTAL KNEE REVISION Right 04/22/2017   Procedure: REVISION RIGHT TOTAL KNEE ARTHROPLASTY VS. POLY EXCHANGE;  Surgeon: Timothy Ford, MD;  Location: [facility] OR;  Service: Orthopedics;  Laterality: Right;   Patient Active Problem List   Diagnosis Date Noted   Gross hematuria 02/13/2023   Acute prostatitis 02/13/2023   Right adrenal mass (HCC) 02/13/2023   Left renal stone 02/13/2023   High  serum lipoprotein(a) 09/05/2022   Encounter for general adult medical examination with abnormal findings 06/12/2021   Carpal tunnel syndrome, left upper limb 11/28/2020   Carpal tunnel syndrome, right upper limb 11/28/2020   Erectile dysfunction due to arterial insufficiency 09/24/2020   Benign prostatic hyperplasia with weak urinary stream 05/05/2018   Hyperlipidemia LDL goal <100 05/05/2018   Pure hyperglyceridemia 05/05/2018   Psychophysiological insomnia 05/05/2018   Lumbar radiculopathy 07/15/2017   Hyperlipidemia associated with type 2 diabetes mellitus (HCC) 06/10/2017   Essential hypertension 06/10/2017   Type 2 diabetes mellitus without complication, without long-term current use of insulin  (HCC) 06/10/2017   Spondylolisthesis, lumbar region 04/21/2017    ONSET DATE: DOS 11/07/22  REFERRING DIAG:  G56.02 (ICD-10-CM) - Carpal tunnel syndrome, left upper limb  G56.01 (ICD-10-CM) - Carpal tunnel syndrome, right upper limb    THERAPY DIAG:  Muscle weakness (generalized)  Other lack of coordination  Paresthesia of skin  Rationale for Evaluation and Treatment: Rehabilitation  PERTINENT HISTORY: Carpal tunnel release over 3 months ago and 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  Now having motor skill problems and still mild sensitivity deficits in the left dominant hand and arm. He states that he has not been able to write and that it feels like he "has  Parkinson's" after his carpal tunnel release.  He has had some shaking and poor coordination though he states paresthesia is 3/10, states difficulty writing his name, and he's avoiding tasks with Lt dom hand.  He is a sports med doctor  PRECAUTIONS: None;  RED FLAGS: None ;  WEIGHT BEARING RESTRICTIONS: No   SUBJECTIVE:   SUBJECTIVE STATEMENT: He returns from a vacation to [place].  He states his handwriting is better, tingling is only in finger tips now.   Still states buttons are hard and having paresthesia, though  better now.       PAIN:  Are you having pain? No pain, just paresthesia rates 1/10 (MF is the worst)   PATIENT GOALS: Improve writing skills and use of left dominant hand  NEXT MD VISIT: As needed    OBJECTIVE: (All objective assessments below are from initial evaluation on: 03/13/23 unless otherwise specified.)   HAND DOMINANCE: Left   ADLs: Overall ADLs: States decreased ability to grab, hold household objects, pain and difficulty to open containers, perform FMS tasks (manipulate fasteners on clothing)   FUNCTIONAL OUTCOME MEASURES: Eval: Quick DASH 34% impairment today  (Higher % Score  =  More Impairment)     UPPER EXTREMITY ROM     Shoulder to Wrist AROM Left eval LT 03/30/23  Forearm supination 87 (80 Rt) 90  Forearm pronation  70 (75 Rt)  74  Wrist flexion 67 ( 51 Rt)  60  Wrist extension 59 ( 57 Rt) 53  (Blank rows = not tested)   Hand AROM Left eval Lt 03/30/23  Full Fist Ability (or Gap to Distal Palmar Crease) Full loose fist (if sticks out)    Thumb Opposition  (Kapandji Scale)  6/10 7/10  Thumb MCP (0-60)    Thumb IP (0-80)    (Blank rows = not tested)   UPPER EXTREMITY MMT:     MMT Left 03/13/2023  Thumb IP flexion 3 -/5  Elbow flexion   Elbow extension   Forearm supination   Forearm pronation   Wrist flexion   Wrist extension   (Blank rows = not tested)  HAND FUNCTION: 03/30/23: Lt grip: 65#  (103# Rt) Tip pinch: 2#   Eval: Observed weakness in affected Lt dom hand.  Grip strength Right: 100 lbs, Left: 68 lbs  Tip pinch Rt: 18#, Lt: 2#  COORDINATION: 03/30/23: 9HPT: 35 sec today (he drops 1 peg and fumbles with it)  Eval: Observed coordination impairments with affected Lt hand. 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  May have some shoulder or neck involvement as well. (C7 in the middle finger seems to be the worst of it).  Also has nonoperative carpal tunnel syndrome in the right arm that may benefit  from OT.   TODAY'S TREATMENT:  03/30/23: OT reviews his full home exercise program (including nerve gliding and stretches) and adds a thumb IP joint blocking active range of motion activity with isometric squeeze for 5 to 10 seconds.  His wrist motion was somewhat tight today and he was highly recommended to continue wrist stretches each day.  Additionally, OT educates on in hand manipulation activities with a pen to improve handwriting and fine motor skills as listed below.  OT also performs manual therapy cupping modality over his scar and thenar eminence which he states he can feel "deeply" in his palm somewhat but with no immediate aftereffects reported.   In-Hand Manipulation Skills Rotation:  Hold pen, try to "twirl" like a baton, keeping parallel (or flat) with surface of table. Try going BOTH directions 10x  Flip:  Hold pen in writing position,  flip in an arch to "erase" position, then back to "write" position. Do not lift hand off table.  10x  Translation:  Open hand palm up,  put an object in your palm and then use your fingers and thumb to move it to the tips of your fingers, pinched against your thumb. (bigger is easier (fat marker), smaller is harder (penny)) 10x  Shift:  Hold pen like a dart, start "shifting" it forward & backwards from tip to base  (like putting a key in a key hole) 10x     Exercises reviewed/performed today  Exercises - Median Nerve Flossing  - 2-3 x daily - 5-10 reps - Seated Median Nerve Glide  - 3-4 x daily - 5 reps - Forearm Supination Stretch  - 3-4 x daily - 3-5 reps - 15 sec hold - Seated Wrist Extension Stretch  - 3-6 x daily - 3-5 reps - 15 hold - Seated Composite Thumb Flexion PROM  - 2-3 x daily - 3-5 reps - 15 sec hold - HOOK Stretch  - 4 x daily - 3-5 reps - 15-20 sec hold - Thumb Press  - 2-3 x daily - 5 reps - Full Fist  - 2-3 x daily - 5 reps - Thumb AROM IP Blocking  - 2-3 x daily - 10 reps   PATIENT EDUCATION: Education details: See tx section above for details  Person educated: Patient Education method: Verbal Instruction, Teach back, Handouts  Education comprehension: States and demonstrates understanding, Additional Education required    HOME EXERCISE PROGRAM: Access Code: [email or web address] URL: [email or web address] Date: 03/13/2023 Prepared by: Leartis Proud   GOALS: Goals reviewed with patient? Yes   SHORT TERM GOALS: (STG required if POC>30 days) Target Date: 04/03/2023  Pt will obtain protective, custom orthotic. Goal status: TBD/PRN  2.  Pt will demo/state understanding of initial HEP to improve pain levels and prerequisite motion. Goal status: INITIAL   LONG TERM GOALS: Target Date: 05/08/2023  Pt will improve functional ability by decreased impairment per Quick DASH assessment from 34% to 15% or better, for better quality of life. Goal status: INITIAL  2.  Pt will improve grip strength in left dominant hand from 68 lbs to at least 75 lbs for functional use at home and in IADLs. Goal status: INITIAL  3.  Pt will improve A/ROM in left thumb opposition from 6/10 to at least 8/10, to have functional motion for tasks like reach and grasp.  Goal status: INITIAL  4.  Pt will improve strength in left thumb FPL from 3 -/5 MMT to at least 4/5 MMT to have increased  functional ability to  carry out selfcare and higher-level homecare tasks with less difficulty. Goal status: INITIAL  5.  Pt will improve coordination skills in left dominant hand, as seen by improved score on nine-hole peg testing from 30 seconds to 25 seconds or better to have increased functional ability to carry out fine motor tasks (fasteners, etc.) and more complex, coordinated IADLs (meal prep, sports, etc.).  Goal status: INITIAL  6.  Pt will decrease paresthesia at rest from 3/10 to 1/10 or better to have better sleep and occupational participation in daily roles. Goal status: INITIAL   ASSESSMENT:  CLINICAL IMPRESSION: 03/30/23: It is great that he is reporting less paresthesia and now localized to the fingertips rather than the whole digits.  Unfortunately, he did not test better with gripping, pinching or coordination today.  Thumb remains fairly weak at the flexor pollicis longus, so we upgraded to isometric FPL squeezes with blocking.  He states understanding his HEP and all recommendations very well and understanding that this is a process that could take months potentially to improve.  He states that he will come back as needed for checks, but it might not be for a month or 2.  OT is in agreement with this as long as he stays consistent with his home exercise program.    PLAN:  OT FREQUENCY: 1-2x/week  OT DURATION: 8 weeks up to 05/08/2023 and up to 10 total visits as needed  PLANNED INTERVENTIONS: 97168 OT Re-evaluation, 97535 self care/ADL training, 16109 therapeutic exercise, 97530 therapeutic activity, 97112 neuromuscular re-education, 97140 manual therapy, 97035 ultrasound, 97039 fluidotherapy, 97010 moist heat, 97010 cryotherapy, 97760 Orthotics management and training, 60454 Splinting (initial encounter), [id number] Subsequent splinting/medication, scar mobilization, Dry needling, energy conservation, coping strategies training, and patient/family education  RECOMMENDED OTHER  SERVICES: None now-though he is currently in PT for his left shoulder  CONSULTED AND AGREED WITH PLAN OF CARE: Patient  PLAN FOR NEXT SESSION:   See back as needed with new ideas and exercises and manual therapy for him also checking status towards goals.   Leartis Proud, OTR/L, CHT 03/30/2023, 1:56 PM

## 2023-03-30 ENCOUNTER — Ambulatory Visit: Payer: PPO | Admitting: Rehabilitative and Restorative Service Providers"

## 2023-03-30 ENCOUNTER — Encounter: Payer: Self-pay | Admitting: Rehabilitative and Restorative Service Providers"

## 2023-03-30 ENCOUNTER — Other Ambulatory Visit: Payer: Self-pay | Admitting: Urology

## 2023-03-30 ENCOUNTER — Ambulatory Visit (INDEPENDENT_AMBULATORY_CARE_PROVIDER_SITE_OTHER): Payer: PPO | Admitting: Rehabilitative and Restorative Service Providers"

## 2023-03-30 DIAGNOSIS — M6281 Muscle weakness (generalized): Secondary | ICD-10-CM

## 2023-03-30 DIAGNOSIS — R202 Paresthesia of skin: Secondary | ICD-10-CM | POA: Diagnosis not present

## 2023-03-30 DIAGNOSIS — N200 Calculus of kidney: Secondary | ICD-10-CM | POA: Diagnosis not present

## 2023-03-30 DIAGNOSIS — D4411 Neoplasm of uncertain behavior of right adrenal gland: Secondary | ICD-10-CM | POA: Diagnosis not present

## 2023-03-30 DIAGNOSIS — R278 Other lack of coordination: Secondary | ICD-10-CM

## 2023-03-30 DIAGNOSIS — M25512 Pain in left shoulder: Secondary | ICD-10-CM

## 2023-03-30 DIAGNOSIS — N202 Calculus of kidney with calculus of ureter: Secondary | ICD-10-CM | POA: Diagnosis not present

## 2023-03-30 DIAGNOSIS — R293 Abnormal posture: Secondary | ICD-10-CM

## 2023-03-30 DIAGNOSIS — K8020 Calculus of gallbladder without cholecystitis without obstruction: Secondary | ICD-10-CM | POA: Diagnosis not present

## 2023-03-30 NOTE — Therapy (Signed)
 OUTPATIENT PHYSICAL THERAPY TREATMENT  Patient Name: Adam Ware, Dr. MRN: [id number] DOB:January 19, 1951, 73 y.o., male Today's Date: 03/30/2023  END OF SESSION:  PT End of Session - 03/30/23 1011     Visit Number 4    Number of Visits 5    Date for PT Re-Evaluation 04/16/23    Authorization Type [employer]    Authorization Time Period 01/23/23 to 01/23/23    PT Start Time 1005    PT Stop Time 1045    PT Time Calculation (min) 40 min    Activity Tolerance Patient tolerated treatment well    Behavior During Therapy [facility] for tasks assessed/performed               Past Medical History:  Diagnosis Date   Arthritis    Diabetes mellitus without complication (HCC)    type 2; tx metformin  and wkly ozempic  on Sundays   Hair loss    History of kidney stones    surgery to remove   Hyperlipidemia    tx w/atorvastatin    Hypertension    tx w/lisinopril    Insomnia    tx w/ambien    Past Surgical History:  Procedure Laterality Date   APPENDECTOMY     COLONOSCOPY     I & D KNEE WITH POLY EXCHANGE Right 01/24/2022   Procedure: IRRIGATION AND DEBRIDEMENT RIGHT KNEE WITH POLY EXCHANGE;  Surgeon: Timothy Ford, MD;  Location: [facility] OR;  Service: Orthopedics;  Laterality: Right;   LITHOTRIPSY     MINOR CARPAL TUNNEL Left 11/07/2022   Procedure: MINOR CARPAL TUNNEL;  Surgeon: Merrill Abide, MD;  Location: [facility];  Service: Orthopedics;  Laterality: Left;  NEEDS RNFA (OPEN)   REPLACEMENT TOTAL KNEE Right    TOTAL KNEE REVISION Right 04/22/2017   Procedure: REVISION RIGHT TOTAL KNEE ARTHROPLASTY VS. POLY EXCHANGE;  Surgeon: Timothy Ford, MD;  Location: [facility] OR;  Service: Orthopedics;  Laterality: Right;   Patient Active Problem List   Diagnosis Date Noted   Gross hematuria 02/13/2023   Acute prostatitis 02/13/2023   Right adrenal mass (HCC) 02/13/2023   Left renal stone 02/13/2023   High serum lipoprotein(a) 09/05/2022   Encounter for general adult  medical examination with abnormal findings 06/12/2021   Carpal tunnel syndrome, left upper limb 11/28/2020   Carpal tunnel syndrome, right upper limb 11/28/2020   Erectile dysfunction due to arterial insufficiency 09/24/2020   Benign prostatic hyperplasia with weak urinary stream 05/05/2018   Hyperlipidemia LDL goal <100 05/05/2018   Pure hyperglyceridemia 05/05/2018   Psychophysiological insomnia 05/05/2018   Lumbar radiculopathy 07/15/2017   Hyperlipidemia associated with type 2 diabetes mellitus (HCC) 06/10/2017   Essential hypertension 06/10/2017   Type 2 diabetes mellitus without complication, without long-term current use of insulin  (HCC) 06/10/2017   Spondylolisthesis, lumbar region 04/21/2017    PCP: Arcadio Knuckles, MD  REFERRING PROVIDER: Shauna Del, DO  REFERRING DIAG:  [id number] (ICD-10-CM) - Subacromial bursitis of left shoulder joint  M75.42 (ICD-10-CM) - Impingement syndrome of left shoulder  M25.512,G89.29 (ICD-10-CM) - Chronic left shoulder pain    THERAPY DIAG:  No diagnosis found.  Rationale for Evaluation and Treatment: Rehabilitation  ONSET DATE: 02/23/2023  MD referral to PT  SUBJECTIVE:  SUBJECTIVE STATEMENT: Pt indicated having a good trip.  Felt some irritation in front of shoulder from luggage manipulation.  Otherwise felt good.    PERTINENT HISTORY: LUE minor carpal tunnel surgery 11/07/2022, right TKA with revision 2019 & ID 01/24/22, arthritis, DM2, HTN, HLD, Spondylolisthesis lumbar  PAIN:  Are you having pain? Up to 2/10.  Pain location: left shoulder in joint Pain description: "just there" Aggravating factors: end range IR & ER like putting coat on Relieving factors: got out of stretch  PRECAUTIONS: None  RED FLAGS: None   WEIGHT BEARING RESTRICTIONS:  No  FALLS:  Has patient fallen in last 6 months? No  LIVING ENVIRONMENT: Lives with: lives with their family Lives in: House Stairs: Yes: External: 2 steps; none  OCCUPATION: Doctor   PLOF: Independent  PATIENT GOALS: back to normal full range pain free  NEXT MD VISIT: prn  OBJECTIVE:  Note: Objective measures were completed at Evaluation unless otherwise noted.  DIAGNOSTIC FINDINGS:  02/23/23  Xray "3 views of the left shoulder including AP, scapular Y, axial view were ordered and reviewed by myself.  X-rays show a humeral head well located within the glenohumeral joint.  There are mild age-appropriate arthritic changes about the shoulder with mild to moderate AC joint arthropathy.  There is early spurring off the greater tuberosity, indicative of likely impingement syndrome.  No acute fracture or otherwise bony abnormality noted." US  shows - "Limited MSK ultrasound of the left upper extremity, left shoulder was performed today.  Evaluation of the bicep tendon and short axis shows  there is actually superior migration of the bicep tendon out of the  bicipital groove.  With dynamic scanning this does appear to sublux in and  out.  There is tenosynovitis around the bicep tendon that does extend and  is contiguous with the subacromial bursa.  There is notable subacromial  bursitis with some hyperemia around this location.  The subscapularis  tendon does show some moderate tendinosis without full-thickness tearing.   The supraspinatus has limited visualization secondary to the subacromial  bursitis.  The infraspinatus has a calcification at the insertion near the  humerus with tendinopathy, likely indicative of chronic tearing without  tendon retraction however."  PATIENT SURVEYS :  Patient declined FOTO  COGNITION: Overall cognitive status: Within functional limits for tasks assessed     SENSATION: WFL  POSTURE: 1in forward head posture  UPPER EXTREMITY ROM:    ROM  Right eval Left eval  Shoulder flexion  A:171 with clicking with quick catch at end range initiating eccentric  Shoulder extension    Shoulder abduction  A:85  Shoulder adduction    Shoulder internal rotation  Supine A:60  Shoulder external rotation  Supine A:68  Elbow flexion    Elbow extension    Wrist flexion    Wrist extension    Wrist ulnar deviation    Wrist radial deviation    Wrist pronation    Wrist supination    (Blank rows = not tested)  UPPER EXTREMITY MMT:  MMT Right 03/30/2023 Left 03/30/2023  Shoulder flexion    Shoulder extension    Shoulder abduction    Shoulder adduction    Shoulder internal rotation    Shoulder external rotation    Middle trapezius    Lower trapezius    Elbow flexion    Elbow extension    Wrist flexion    Wrist extension    Wrist ulnar deviation    Wrist radial deviation  Wrist pronation    Wrist supination    Grip strength (lbs)    (Blank rows = not tested)  PALPATION:  Eval:  PT noted click left shoulder with start of eccentric especially end ranges.  Tenderness / quick pain with external rotation with elbow extended (biceps on stretch) which was not present when elbow was flexed.                        TODAY'S TREATMENT:                                                                                                       DATE: 03/30/2023: TherEx:  UBE: seat 10 level 4 X 3 minutes each direction. Standing blue  band ER isometric walk outs 5 sec hold x 10 bilateral   Review verbally of HEP and continued use going forward.    Neuro Re-ed (to improve postural awareness/strength Standing blue band rows bilateral 2 x 15  Standing blue band GH ext bilateral 2 x 15  Standing 90 deg flexion 2 lb ball at wall 30 x 2 bilateral cw, ccw each Prone horizontal abduction hold off table 5 sec hold x 10 Prone gh ext c scap retraction off table 5 sec hold x 10      TODAY'S TREATMENT:                                                                                                        DATE: 03/16/2023: TherEx:  UBE: seat 10 level 4 X 3 minutes each direction. Wall slides at door frame LLE with PT cues on form / technique for shoulder movement pattern.  Flexion 10 reps.  Scaption 10 reps. Reactive isometric stabilization green theraband Left shoulder internal rotation, external rotation, flexion & extension 10 reps ea.  Verbal cues on upper body posture & towel roll under arm to minimize substitution.  PT added above to HEP with HO, demo & verbal cues. Pt verbalized and return demo understanding.   Therapeutic Activities:  PT demo & verbally cued patient on checking and setting posture between patients when working.  Pt verbalized understanding.  PT recommended setting up desk top computer with screen at eye level and keyboard so hand is ~in front of elbow.  Pt verbalized understanding.     TREATMENT:  DATE: 03/09/23: TherEx:  UBE: level 4 x 3 minutes each direction ER walk outs red TB x 10  ER walk outs green TB x 15  Scapular stabilization using 2# ball against wall circles both directions x 30 Supine shoulder at 90 deg flexion: 3 # weight circles bil directions x 30 Prone: I's, T's, Y's and W's to pt's tolerance each x 15 holding 5 sec Seated bil ER using green TB x 20 holding 3 sec  Pt's HEP was updated and pt edu in progression    PATIENT EDUCATION: Education details: HEP, POC Person educated: Patient Education method: Programmer, multimedia, Demonstration, Verbal cues, and Handouts Education comprehension: verbalized understanding, returned demonstration, and verbal cues required  HOME EXERCISE PROGRAM: Access Code: [email or web address] URL: [email or web address] Date: 03/30/2023 Prepared by: Bonna Bustard  Exercises - Supine Cervical Retraction with Towel  - 2 x daily - 7 x weekly - 2 sets - 10 reps - 5  seconds hold - Standing Isometric Shoulder External Rotation with Doorway  - 1 x daily - 7 x weekly - 2-3 sets - 10 reps - 5 seconds hold - Shoulder External Rotation and Scapular Retraction with Resistance  - 1 x daily - 7 x weekly - 3 sets - 10 reps - Standing Shoulder Internal Rotation Stretch with Towel  - 1 x daily - 7 x weekly - 3 reps - 20 seconds hold - Standing Wall Ball Circles in Scaption with Plyo Ball  - 1 x daily - 7 x weekly - 3 sets - 10 reps - Prone Scapular Retraction Arms at Side  - 1 x daily - 7 x weekly - 2 sets - 10 reps - 10 seconds hold - Prone W Scapular Retraction  - 1 x daily - 7 x weekly - 2 sets - 10 reps - 10 seconds hold - Prone Single Arm Shoulder Y  - 1 x daily - 7 x weekly - 2 sets - 10 reps - 10 seconds hold - Standing Shoulder Flexion Wall Walk  - 1 x daily - 5-7 x weekly - 1-2 sets - 10 reps - 5 seconds hold - Standing Shoulder Scaption Wall Walk  - 1 x daily - 5-7 x weekly - 1-2 sets - 10 reps - 5 seconds hold - walking balance with band to your left  - 1 x daily - 5-7 x weekly - 1-2 sets - 10 reps - 5 seconds hold - walking balance with band behind you  - 1 x daily - 5-7 x weekly - 1-2 sets - 10 reps - 5 seconds hold - Standing Shoulder Row Reactive Isometric  - 1 x daily - 5-7 x weekly - 1-2 sets - 10 reps - 5 seconds hold - Standing Bilateral Low Shoulder Row with Anchored Resistance  - 1-2 x daily - 7 x weekly - 2-3 sets - 10-15 reps - Shoulder Extension with Resistance  - 1-2 x daily - 7 x weekly - 1-2 sets - 10-15 reps - Shoulder External Rotation Reactive Isometrics  - 1 x daily - 7 x weekly - 1 sets - 10 reps - 5-15 hold  ASSESSMENT:  CLINICAL IMPRESSION: General presentation indicated progression in strength and symptoms previously reported.  Pt was knowledgeable in HEP to this point.  Some adaptations per Pt request due to improvements noted.  ER mildly limit in strength compared to Lt still.   OBJECTIVE IMPAIRMENTS: decreased activity tolerance,  decreased coordination, decreased knowledge of condition, decreased strength, impaired UE functional use, and pain.  ACTIVITY LIMITATIONS: carrying, lifting, and reach over head  PARTICIPATION LIMITATIONS: occupation and recreation (golf)  PERSONAL FACTORS: 1-2 comorbidities: see PMH  are also affecting patient's functional outcome.   REHAB POTENTIAL: Good  CLINICAL DECISION MAKING: Stable/uncomplicated  EVALUATION COMPLEXITY: Low   GOALS: Goals reviewed with patient? Yes  SHORT TERM GOALS:  same as LTGs  LONG TERM GOALS: (target dates for all long term goals are 4 visits after eval by 2/272/25 )   1. Patient will demonstrate/report pain at worst less than or equal to 2/10 to facilitate minimal limitation in daily activity secondary to pain symptoms. Goal status:met 03/30/2023   2. Patient will demonstrate independent use of home exercise program to facilitate ability to maintain/progress functional gains from skilled physical therapy services. Goal status: met 03/30/2023   3. Patient reports no issues with dominant left shoulder use with ADLs and golf Goal status: mostly met 03/30/2023   4.  Patient will demonstrate ;left shoulder UE MMT 5/5 throughout to facilitate lifting, reaching, carrying at [facility] in daily activity.   Goal status: mostly met 03/30/2023   5.  Patient will demonstrate left GH joint AROM WFL s symptoms to facilitate usual overhead reaching, self care, dressing at PLOF.    Goal status: Met     PLAN: PT FREQUENCY: 1x/week  PT DURATION: 4 weeks  PLANNED INTERVENTIONS: 97164- PT Re-evaluation, 97110-Therapeutic exercises, 97530- Therapeutic activity, [id number]- Neuromuscular re-education, 97535- Self Care, 04540- Manual therapy, [id number]- Aquatic Therapy, [phone]- Electrical stimulation (manual), Patient/Family education, Taping, Dry Needling, Joint mobilization, Cryotherapy, and Moist heat  PLAN FOR NEXT SESSION: Return PRN.    Bonna Bustard, PT, DPT, OCS,  ATC 03/30/23  10:45 AM

## 2023-03-31 ENCOUNTER — Telehealth: Payer: Self-pay | Admitting: Family Medicine

## 2023-03-31 NOTE — Telephone Encounter (Signed)
Pt need pre op instructions/labs, etc.  Called urology for same.

## 2023-04-01 ENCOUNTER — Other Ambulatory Visit: Payer: Self-pay

## 2023-04-01 ENCOUNTER — Other Ambulatory Visit: Payer: Self-pay | Admitting: *Deleted

## 2023-04-01 ENCOUNTER — Other Ambulatory Visit: Payer: PPO

## 2023-04-01 DIAGNOSIS — Z01818 Encounter for other preprocedural examination: Secondary | ICD-10-CM | POA: Diagnosis not present

## 2023-04-02 LAB — CMP14+EGFR
ALT: 20 [IU]/L (ref 0–44)
AST: 21 [IU]/L (ref 0–40)
Albumin: 4.1 g/dL (ref 3.8–4.8)
Alkaline Phosphatase: 111 [IU]/L (ref 44–121)
BUN/Creatinine Ratio: 18 (ref 10–24)
BUN: 15 mg/dL (ref 8–27)
Bilirubin Total: 0.3 mg/dL (ref 0.0–1.2)
CO2: 22 mmol/L (ref 20–29)
Calcium: 9.4 mg/dL (ref 8.6–10.2)
Chloride: 104 mmol/L (ref 96–106)
Creatinine, Ser: 0.82 mg/dL (ref 0.76–1.27)
Globulin, Total: 2.1 g/dL (ref 1.5–4.5)
Glucose: 109 mg/dL — ABNORMAL HIGH (ref 70–99)
Potassium: 4.3 mmol/L (ref 3.5–5.2)
Sodium: 142 mmol/L (ref 134–144)
Total Protein: 6.2 g/dL (ref 6.0–8.5)
eGFR: 92 mL/min/{1.73_m2} (ref >59)

## 2023-04-02 LAB — CBC WITH DIFFERENTIAL/PLATELET
Basophils Absolute: 0 10*3/uL (ref 0.0–0.2)
Basos: 0 %
EOS (ABSOLUTE): 0.1 10*3/uL (ref 0.0–0.4)
Eos: 1 %
Hematocrit: 41.5 % (ref 37.5–51.0)
Hemoglobin: 14.2 g/dL (ref 13.0–17.7)
Immature Grans (Abs): 0 10*3/uL (ref 0.0–0.1)
Immature Granulocytes: 0 %
Lymphocytes Absolute: 3.1 10*3/uL (ref 0.7–3.1)
Lymphs: 39 %
MCH: 29.8 pg (ref 26.6–33.0)
MCHC: 34.2 g/dL (ref 31.5–35.7)
MCV: 87 fL (ref 79–97)
Monocytes Absolute: 0.5 10*3/uL (ref 0.1–0.9)
Monocytes: 6 %
Neutrophils Absolute: 4.1 10*3/uL (ref 1.4–7.0)
Neutrophils: 54 %
Platelets: 316 10*3/uL (ref 150–450)
RBC: 4.76 x10E6/uL (ref 4.14–5.80)
RDW: 12.6 % (ref 11.6–15.4)
WBC: 7.8 10*3/uL (ref 3.4–10.8)

## 2023-04-02 LAB — HEMOGLOBIN A1C
Est. average glucose Bld gHb Est-mCnc: 146 mg/dL
Hgb A1c MFr Bld: 6.7 % — ABNORMAL HIGH (ref 4.8–5.6)

## 2023-04-02 NOTE — Progress Notes (Signed)
Please print his labs and give them to him

## 2023-04-02 NOTE — Patient Instructions (Addendum)
DUE TO COVID-19 ONLY TWO VISITORS  (aged 75 and older)  ARE ALLOWED TO COME WITH YOU AND STAY IN THE WAITING ROOM ONLY DURING PRE OP AND PROCEDURE.   **NO VISITORS ARE ALLOWED IN THE SHORT STAY AREA OR RECOVERY ROOM!!**  IF YOU WILL BE ADMITTED INTO THE HOSPITAL YOU ARE ALLOWED ONLY FOUR SUPPORT PEOPLE DURING VISITATION HOURS ONLY (7 AM -8PM)   The support person(s) must pass our screening, gel in and out, and wear a mask at all times, including in the patient's room. Patients must also wear a mask when staff or their support person are in the room. Visitors GUEST BADGE MUST BE WORN VISIBLY  One adult visitor may remain with you overnight and MUST be in the room by 8 P.M.     Your procedure is scheduled on: 04/13/23   Report to [facility] Main Entrance    Report to admitting at : 7:30 AM   Call this number if you have problems the morning of surgery [phone]   Do not eat food or drink: After Midnight.  FOLLOW BOWEL PREP AND ANY ADDITIONAL PRE OP INSTRUCTIONS YOU RECEIVED FROM YOUR SURGEON'S OFFICE!!!   Oral Hygiene is also important to reduce your risk of infection.                                    Remember - BRUSH YOUR TEETH THE MORNING OF SURGERY WITH YOUR REGULAR TOOTHPASTE  DENTURES WILL BE REMOVED PRIOR TO SURGERY PLEASE DO NOT APPLY "Poly grip" OR ADHESIVES!!!   Do NOT smoke after Midnight   Take these medicines the morning of surgery with A SIP OF WATER: finasteride.  How to Manage Your Diabetes Before and After Surgery  Why is it important to control my blood sugar before and after surgery? Improving blood sugar levels before and after surgery helps healing and can limit problems. A way of improving blood sugar control is eating a healthy diet by:  Eating less sugar and carbohydrates  Increasing activity/exercise  Talking with your doctor about reaching your blood sugar goals High blood sugars (greater than 180 mg/dL) can raise your risk of infections  and slow your recovery, so you will need to focus on controlling your diabetes during the weeks before surgery. Make sure that the doctor who takes care of your diabetes knows about your planned surgery including the date and location.  How do I manage my blood sugar before surgery? Check your blood sugar at least 4 times a day, starting 2 days before surgery, to make sure that the level is not too high or low. Check your blood sugar the morning of your surgery when you wake up and every 2 hours until you get to the Short Stay unit. If your blood sugar is less than 70 mg/dL, you will need to treat for low blood sugar: Do not take insulin. Treat a low blood sugar (less than 70 mg/dL) with  cup of clear juice (cranberry or apple), 4 glucose tablets, OR glucose gel. Recheck blood sugar in 15 minutes after treatment (to make sure it is greater than 70 mg/dL). If your blood sugar is not greater than 70 mg/dL on recheck, call [phone] for further instructions. Report your blood sugar to the short stay nurse when you get to Short Stay.  If you are admitted to the hospital after surgery: Your blood sugar will be checked by  the staff and you will probably be given insulin after surgery (instead of oral diabetes medicines) to make sure you have good blood sugar levels. The goal for blood sugar control after surgery is 80-180 mg/dL.   WHAT DO I DO ABOUT MY DIABETES MEDICATION?    THE MORNING OF SURGERY,DO NOT TAKE ANY ORAL DIABETIC MEDICATIONS DAY OF YOUR SURGERY  DO NOT TAKE THE FOLLOWING 7 DAYS PRIOR TO SURGERY: Ozempic, Wegovy, Rybelsus (Semaglutide), Byetta (exenatide), Bydureon (exenatide ER), Victoza, Saxenda (liraglutide), or Trulicity (dulaglutide) Mounjaro (Tirzepatide) Adlyxin (Lixisenatide), Polyethylene Glycol Loxenatide.HOLD Mounjaro after 04/05/23 dose.  Bring CPAP mask and tubing day of surgery.                              You may not have any metal on your body including hair pins,  jewelry, and body piercing             Do not wear lotions, powders, perfumes/cologne, or deodorant              Men may shave face and neck.   Do not bring valuables to the hospital. [facility] IS NOT             RESPONSIBLE   FOR VALUABLES.   Contacts, glasses, or bridgework may not be worn into surgery.   Bring small overnight bag day of surgery.   DO NOT BRING YOUR HOME MEDICATIONS TO THE HOSPITAL. PHARMACY WILL DISPENSE MEDICATIONS LISTED ON YOUR MEDICATION LIST TO YOU DURING YOUR ADMISSION IN THE HOSPITAL!    Patients discharged on the day of surgery will not be allowed to drive home.  Someone NEEDS to stay with you for the first 24 hours after anesthesia.   Special Instructions: Bring a copy of your healthcare power of attorney and living will documents         the day of surgery if you haven't scanned them before.              Please read over the following fact sheets you were given: IF YOU HAVE QUESTIONS ABOUT YOUR PRE-OP INSTRUCTIONS PLEASE CALL [phone]    [facility] - Preparing for Surgery Before surgery, you can play an important role.  Because skin is not sterile, your skin needs to be as free of germs as possible.  You can reduce the number of germs on your skin by washing with CHG (chlorahexidine gluconate) soap before surgery.  CHG is an antiseptic cleaner which kills germs and bonds with the skin to continue killing germs even after washing. Please DO NOT use if you have an allergy to CHG or antibacterial soaps.  If your skin becomes reddened/irritated stop using the CHG and inform your nurse when you arrive at Short Stay. Do not shave (including legs and underarms) for at least 48 hours prior to the first CHG shower.  You may shave your face/neck. Please follow these instructions carefully:  1.  Shower with CHG Soap the night before surgery and the  morning of Surgery.  2.  If you choose to wash your hair, wash your hair first as usual with your  normal   shampoo.  3.  After you shampoo, rinse your hair and body thoroughly to remove the  shampoo.                           4.  Use CHG as you  would any other liquid soap.  You can apply chg directly  to the skin and wash                       Gently with a scrungie or clean washcloth.  5.  Apply the CHG Soap to your body ONLY FROM THE NECK DOWN.   Do not use on face/ open                           Wound or open sores. Avoid contact with eyes, ears mouth and genitals (private parts).                       Wash face,  Genitals (private parts) with your normal soap.             6.  Wash thoroughly, paying special attention to the area where your surgery  will be performed.  7.  Thoroughly rinse your body with warm water from the neck down.  8.  DO NOT shower/wash with your normal soap after using and rinsing off  the CHG Soap.                9.  Pat yourself dry with a clean towel.            10.  Wear clean pajamas.            11.  Place clean sheets on your bed the night of your first shower and do not  sleep with pets. Day of Surgery : Do not apply any lotions/deodorants the morning of surgery.  Please wear clean clothes to the hospital/surgery center.  FAILURE TO FOLLOW THESE INSTRUCTIONS MAY RESULT IN THE CANCELLATION OF YOUR SURGERY PATIENT SIGNATURE_________________________________  NURSE SIGNATURE__________________________________  ________________________________________________________________________

## 2023-04-06 ENCOUNTER — Other Ambulatory Visit: Payer: Self-pay

## 2023-04-06 ENCOUNTER — Encounter (HOSPITAL_COMMUNITY): Payer: Self-pay

## 2023-04-06 ENCOUNTER — Encounter (HOSPITAL_COMMUNITY)
Admission: RE | Admit: 2023-04-06 | Discharge: 2023-04-06 | Disposition: A | Discharge status: Home / Self Care | Payer: PPO | Source: Ambulatory Visit | Attending: Urology | Admitting: Urology

## 2023-04-06 VITALS — Ht 72.0 in | Wt 195.0 lb

## 2023-04-06 DIAGNOSIS — E119 Type 2 diabetes mellitus without complications: Secondary | ICD-10-CM

## 2023-04-06 HISTORY — DX: Malignant (primary) neoplasm, unspecified: C80.1

## 2023-04-06 HISTORY — DX: Neoplasm of unspecified behavior of endocrine glands and other parts of nervous system: D49.7

## 2023-04-06 NOTE — Progress Notes (Signed)
For Anesthesia: PCP - Etta Grandchild, MD  Cardiologist -   Bowel Prep reminder:  Chest x-ray -  EKG - 04/01/23: EPIC Stress Test -  ECHO -  Cardiac Cath -  Pacemaker/ICD device last checked: Pacemaker orders received: Device Rep notified:  Spinal Cord Stimulator:N/A  Sleep Study - N/A CPAP -   Fasting Blood Sugar - N/A Checks Blood Sugar __0___ times a day Date and result of last Hgb A1c-6/7: 04/01/23  Last dose of GLP1 agonist- Mounjaro GLP1 instructions: To hold it after: 04/05/23  Last dose of SGLT-2 inhibitors-  SGLT-2 instructions:   Blood Thinner Instructions:N/A Aspirin Instructions: Last Dose:  Activity level: Can go up a flight of stairs and activities of daily living without stopping and without chest pain and/or shortness of breath   Able to exercise without chest pain and/or shortness of breath  Anesthesia review: Hx: HTN,DIA  Patient denies shortness of breath, fever, cough and chest pain at PAT appointment   Patient verbalized understanding of instructions that were given to them at the PAT appointment. Patient was also instructed that they will need to review over the PAT instructions again at home before surgery.

## 2023-04-06 NOTE — Pre-Procedure Instructions (Signed)
Interview and instructions were done over the phone, as per pt. request.Pt. verbalized his understanding of the instructions.

## 2023-04-08 ENCOUNTER — Encounter: Payer: Self-pay | Admitting: Urology

## 2023-04-10 ENCOUNTER — Other Ambulatory Visit: Payer: Self-pay | Admitting: Radiology

## 2023-04-10 DIAGNOSIS — N200 Calculus of kidney: Secondary | ICD-10-CM

## 2023-04-10 NOTE — H&P (Addendum)
 Chief Complaint: left renal stone - image guided left percutaneous nephrostomy tube   Operating room to follow - Modena Slater   Referring Provider(s): Modena Slater   Supervising Physician: Roanna Banning  Patient Status: 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  Pt presented to his primary care Dr. Yetta Barre with [facility] on 03/16/23 with intermittent hematuria.  In the past he had a similar hematuria event when passing renal stones.  A urinalysis was obtained revealing RBC TNTC and few WBC, he was started on ciprofloxacin and a CT renal stone study was obtained revealing multiple large, nonobstructive renal caliculi largest measuring 19 mm.  Pt was referred to Dr. Alvester Morin of Urology who desires to perform lithotripsy - he has consulted interventional radiology for image guided left percutaneous nephrostomy tube that will be followed up by himself (Dr. Alvester Morin) in the operating room on the same day.     Patient is Full Code  Past Medical History:  Diagnosis Date   Adrenal tumor    rigth   Arthritis    Cancer (HCC)    basal cell   Diabetes mellitus without complication (HCC)    type 2; tx metformin and wkly ozempic on Sundays   Hair loss    History of kidney stones    surgery to remove   Hyperlipidemia    tx w/atorvastatin   Hypertension    tx w/lisinopril   Insomnia    tx w/ambien    Past Surgical History:  Procedure Laterality Date   APPENDECTOMY     COLONOSCOPY     I & D KNEE WITH POLY EXCHANGE Right 01/24/2022   Procedure: IRRIGATION AND DEBRIDEMENT RIGHT KNEE WITH POLY EXCHANGE;  Surgeon: Nadara Mustard, MD;  Location: [facility] OR;  Service: Orthopedics;  Laterality: Right;   LITHOTRIPSY     MINOR CARPAL TUNNEL Left 11/07/2022   Procedure: MINOR CARPAL TUNNEL;  Surgeon: Samuella Cota, MD;  Location: [facility];  Service: Orthopedics;  Laterality:  Left;  NEEDS RNFA (OPEN)   REPLACEMENT TOTAL KNEE Right    TOTAL KNEE REVISION Right 04/22/2017   Procedure: REVISION RIGHT TOTAL KNEE ARTHROPLASTY VS. POLY EXCHANGE;  Surgeon: Nadara Mustard, MD;  Location: [facility] OR;  Service: Orthopedics;  Laterality: Right;    Allergies: Sulfa antibiotics  Medications: Prior to Admission medications   Medication Sig Start Date End Date Taking? Authorizing Provider  atorvastatin (LIPITOR) 10 MG tablet Take 1 tablet (10 mg total) by mouth 3 (three) times a week. Patient taking differently: Take 10 mg by mouth every Monday, Wednesday, and Friday. In the morning 02/20/23   Etta Grandchild, MD  dexamethasone (DECADRON) 1 MG tablet Take 1 tablet by mouth As Directed. Take at 11 pm the day prior to having your cortisol level at 8AM the next day Patient not taking: Reported on 04/02/2023 03/06/23     finasteride (PROSCAR) 5 MG tablet Take 1 tablet (5 mg total) by mouth daily. Patient taking differently: Take 1.25 mg by mouth daily. 06/12/21   Etta Grandchild, MD  lisinopril (ZESTRIL) 10 MG tablet Take 1 tablet by mouth daily. 09/07/22   Etta Grandchild, MD  metFORMIN (GLUCOPHAGE-XR) 500 MG 24 hr tablet Take 1 tablet (500 mg total) by mouth daily with breakfast. 03/02/23   Etta Grandchild, MD  tadalafil (CIALIS) 20 MG tablet Take 1  tablet (20 mg total) by mouth daily as needed for erectile dysfunction. 03/03/23   Etta Grandchild, MD  tirzepatide [facility]) 15 MG/0.5ML Pen Inject 15 mg into the skin once a week. Patient taking differently: Inject 15 mg into the skin every Sunday. 01/21/23   Etta Grandchild, MD  zolpidem (AMBIEN CR) 6.25 MG CR tablet Take 1 tablet (6.25 mg total) by mouth at bedtime as needed for sleep. 01/21/23   Etta Grandchild, MD     Family History  Problem Relation Age of Onset   Cancer Mother    Diabetes Mother    Mental illness Mother    Heart disease Father    Arthritis Brother     Social History   Socioeconomic History   Marital status:  Single    Spouse name: Not on file   Number of children: Not on file   Years of education: Not on file   Highest education level: Not on file  Occupational History   Occupation: Part time  Tobacco Use   Smoking status: Never    Passive exposure: Never   Smokeless tobacco: Never  Vaping Use   Vaping status: Never Used  Substance and Sexual Activity   Alcohol use: Yes    Alcohol/week: 1.0 standard drink of alcohol    Types: 1 Glasses of wine per week    Comment: wine daily   Drug use: No   Sexual activity: Not Currently    Partners: Male  Other Topics Concern   Not on file  Social History Narrative   Lives alone with dog   Social Drivers of Health   Financial Resource Strain: Low Risk  (12/05/2022)   Overall Financial Resource Strain (CARDIA)    Difficulty of Paying Living Expenses: Not hard at all  Food Insecurity: No Food Insecurity (12/05/2022)   Hunger Vital Sign    Worried About Running Out of Food in the Last Year: Never true    Ran Out of Food in the Last Year: Never true  Transportation Needs: No Transportation Needs (12/05/2022)   PRAPARE - Administrator, Civil Service (Medical): No    Lack of Transportation (Non-Medical): No  Physical Activity: Sufficiently Active (12/05/2022)   Exercise Vital Sign    Days of Exercise per Week: 4 days    Minutes of Exercise per Session: 50 min  Stress: No Stress Concern Present (12/05/2022)   [employer] of Occupational Health - Occupational Stress Questionnaire    Feeling of Stress : Not at all  Social Connections: Socially Isolated (12/05/2022)   Social Connection and Isolation Panel [NHANES]    Frequency of Communication with Friends and Family: More than three times a week    Frequency of Social Gatherings with Friends and Family: Three times a week    Attends Religious Services: Never    Active Member of Clubs or Organizations: No    Attends Banker Meetings: Never    Marital Status:  Divorced     Review of Systems: A 12 point ROS discussed and pertinent positives are indicated in the HPI above.  All other systems are negative.  Review of Systems  Constitutional:  Negative for fatigue and fever.  HENT:  Negative for congestion and dental problem.   Respiratory:  Negative for cough, chest tightness, shortness of breath and wheezing.   Cardiovascular:  Negative for chest pain.  Gastrointestinal:  Negative for diarrhea, nausea and vomiting.  Neurological:  Negative for light-headedness and headaches.  Psychiatric/Behavioral:  Negative for agitation, behavioral problems and confusion.     Vital Signs: There were no vitals taken for this visit.  Advance Care Plan: The advanced care place/surrogate decision maker was discussed at the time of visit and the patient did not wish to discuss or was not able to name a surrogate decision maker or provide an advance care plan.  Physical Exam Constitutional:      Appearance: Normal appearance.  HENT:     Head: Normocephalic and atraumatic.     Mouth/Throat:     Mouth: Mucous membranes are moist.  Cardiovascular:     Rate and Rhythm: Normal rate and regular rhythm.  Pulmonary:     Effort: Pulmonary effort is normal.     Breath sounds: Normal breath sounds.  Abdominal:     General: Bowel sounds are normal.     Palpations: Abdomen is soft.  Musculoskeletal:        General: Normal range of motion.     Cervical back: Normal range of motion.  Skin:    General: Skin is warm and dry.  Neurological:     Mental Status: He is alert and oriented to person, place, and time.  Psychiatric:        Mood and Affect: Mood normal.        Behavior: Behavior normal.     Imaging: No results found.  Labs:  CBC: Recent Labs    09/01/22 0904 02/13/23 0927 04/01/23 1633  WBC 5.9 6.2 7.8  HGB 13.5 13.6 14.2  HCT 41.4 41.6 41.5  PLT 263.0 275.0 316    COAGS: No results for input(s): "INR", "APTT" in the last 8760  hours.  BMP: Recent Labs    09/01/22 0904 02/13/23 0927 04/01/23 1633  NA 139 140 142  K 4.4 4.2 4.3  CL 105 102 104  CO2 28 30 22   GLUCOSE 189* 156* 109*  BUN 15 13 15   CALCIUM 9.1 9.1 9.4  CREATININE 0.77 0.73 0.82    LIVER FUNCTION TESTS: Recent Labs    09/01/22 0904 04/01/23 1633  BILITOT 0.9 0.3  AST 17 21  ALT 25 20  ALKPHOS 87 111  PROT 6.4 6.2  ALBUMIN 4.1 4.1    TUMOR MARKERS: No results for input(s): "AFPTM", "CEA", "CA199", "CHROMGRNA" in the last 8760 hours.  Assessment and Plan:  Pt is with left renal stones scheduled for image guided left percutaneous nephrostomy tube 04/13/23.  OR to follow with Dr. Alvester Morin.    Risks and benefits of image guided left PCN placement was discussed with the patient including, but not limited to, infection, bleeding, significant bleeding causing loss or decrease in renal function or damage to adjacent structures.   All of the patient's questions were answered, patient is agreeable to proceed.  Consent signed and in chart.  Thank you for allowing our service to participate in [name], Dr. 's care.  Electronically Signed: Loman Brooklyn, PA-C   04/13/2023, 8:15 AM    I spent a total of  30 Minutes   in face to face in clinical consultation, greater than 50% of which was counseling/coordinating care for image guided left percutaneous nephrostomy tube placement.

## 2023-04-11 ENCOUNTER — Other Ambulatory Visit: Payer: Self-pay | Admitting: Student

## 2023-04-11 NOTE — Anesthesia Preprocedure Evaluation (Signed)
 Anesthesia Evaluation  Patient identified by MRN, date of birth, ID band Patient awake    Reviewed: Allergy & Precautions, NPO status , Patient's Chart, lab work & pertinent test results  Airway Mallampati: II  TM Distance: >3 FB Neck ROM: Full    Dental  (+) Teeth Intact, Dental Advisory Given   Pulmonary neg pulmonary ROS   Pulmonary exam normal breath sounds clear to auscultation       Cardiovascular hypertension (145/76 preop, per pt normally 120s SBP), Pt. on medications Normal cardiovascular exam Rhythm:Regular Rate:Normal     Neuro/Psych negative neurological ROS  negative psych ROS   GI/Hepatic negative GI ROS, Neg liver ROS,,,  Endo/Other  diabetes, Well Controlled, Type 2, Oral Hypoglycemic Agents    Renal/GU negative Renal ROS  negative genitourinary   Musculoskeletal  (+) Arthritis , Osteoarthritis,    Abdominal   Peds  Hematology negative hematology ROS (+)   Anesthesia Other Findings Mounjaro LD: 8d ago  Reproductive/Obstetrics negative OB ROS                             Anesthesia Physical Anesthesia Plan  ASA: 2  Anesthesia Plan: General   Post-op Pain Management:    Induction: Intravenous  PONV Risk Score and Plan: 3 and Dexamethasone, Ondansetron, Midazolam and Treatment may vary due to age or medical condition  Airway Management Planned: Oral ETT  Additional Equipment: None  Intra-op Plan:   Post-operative Plan: Extubation in OR  Informed Consent: I have reviewed the patients History and Physical, chart, labs and discussed the procedure including the risks, benefits and alternatives for the proposed anesthesia with the patient or authorized representative who has indicated his/her understanding and acceptance.     Dental advisory given  Plan Discussed with: CRNA  Anesthesia Plan Comments:         Anesthesia Quick Evaluation

## 2023-04-13 ENCOUNTER — Ambulatory Visit (HOSPITAL_COMMUNITY): Payer: Self-pay | Admitting: Anesthesiology

## 2023-04-13 ENCOUNTER — Observation Stay (HOSPITAL_COMMUNITY)
Admission: RE | Admit: 2023-04-13 | Discharge: 2023-04-14 | Disposition: A | Discharge status: Home / Self Care | Payer: PPO | Attending: Urology | Admitting: Urology

## 2023-04-13 ENCOUNTER — Encounter (HOSPITAL_COMMUNITY)
Admission: RE | Disposition: A | Discharge status: Home / Self Care | Payer: Self-pay | Source: Home / Self Care | Attending: Urology

## 2023-04-13 ENCOUNTER — Ambulatory Visit (HOSPITAL_COMMUNITY): Payer: PPO

## 2023-04-13 ENCOUNTER — Ambulatory Visit (HOSPITAL_COMMUNITY)
Admission: RE | Admit: 2023-04-13 | Discharge: 2023-04-13 | Disposition: A | Discharge status: Home / Self Care | Payer: PPO | Source: Ambulatory Visit | Attending: Urology | Admitting: Urology

## 2023-04-13 ENCOUNTER — Ambulatory Visit (HOSPITAL_BASED_OUTPATIENT_CLINIC_OR_DEPARTMENT_OTHER): Payer: Self-pay | Admitting: Anesthesiology

## 2023-04-13 ENCOUNTER — Other Ambulatory Visit (HOSPITAL_COMMUNITY): Payer: Self-pay

## 2023-04-13 ENCOUNTER — Other Ambulatory Visit: Payer: Self-pay

## 2023-04-13 ENCOUNTER — Encounter (HOSPITAL_COMMUNITY): Payer: Self-pay | Admitting: Urology

## 2023-04-13 ENCOUNTER — Encounter (HOSPITAL_COMMUNITY): Payer: Self-pay

## 2023-04-13 DIAGNOSIS — N201 Calculus of ureter: Secondary | ICD-10-CM | POA: Diagnosis not present

## 2023-04-13 DIAGNOSIS — N200 Calculus of kidney: Secondary | ICD-10-CM | POA: Diagnosis not present

## 2023-04-13 DIAGNOSIS — Z85828 Personal history of other malignant neoplasm of skin: Secondary | ICD-10-CM | POA: Diagnosis not present

## 2023-04-13 DIAGNOSIS — E119 Type 2 diabetes mellitus without complications: Secondary | ICD-10-CM | POA: Diagnosis not present

## 2023-04-13 DIAGNOSIS — Z79899 Other long term (current) drug therapy: Secondary | ICD-10-CM | POA: Diagnosis not present

## 2023-04-13 DIAGNOSIS — Z9889 Other specified postprocedural states: Secondary | ICD-10-CM | POA: Diagnosis not present

## 2023-04-13 DIAGNOSIS — I10 Essential (primary) hypertension: Secondary | ICD-10-CM | POA: Diagnosis not present

## 2023-04-13 DIAGNOSIS — Z96651 Presence of right artificial knee joint: Secondary | ICD-10-CM | POA: Diagnosis not present

## 2023-04-13 HISTORY — PX: NEPHROLITHOTOMY: SHX5134

## 2023-04-13 HISTORY — PX: IR URETERAL STENT LEFT NEW ACCESS W/O SEP NEPHROSTOMY CATH: IMG6075

## 2023-04-13 LAB — CBC WITH DIFFERENTIAL/PLATELET
Abs Immature Granulocytes: 0.02 10*3/uL (ref 0.00–0.07)
Basophils Absolute: 0 10*3/uL (ref 0.0–0.1)
Basophils Relative: 1 %
Eosinophils Absolute: 0.1 10*3/uL (ref 0.0–0.5)
Eosinophils Relative: 1 %
HCT: 40.8 % (ref 39.0–52.0)
Hemoglobin: 13.2 g/dL (ref 13.0–17.0)
Immature Granulocytes: 0 %
Lymphocytes Relative: 42 %
Lymphs Abs: 2.5 10*3/uL (ref 0.7–4.0)
MCH: 28.9 pg (ref 26.0–34.0)
MCHC: 32.4 g/dL (ref 30.0–36.0)
MCV: 89.5 fL (ref 80.0–100.0)
Monocytes Absolute: 0.4 10*3/uL (ref 0.1–1.0)
Monocytes Relative: 7 %
Neutro Abs: 3 10*3/uL (ref 1.7–7.7)
Neutrophils Relative %: 49 %
Platelets: 241 10*3/uL (ref 150–400)
RBC: 4.56 MIL/uL (ref 4.22–5.81)
RDW: 12.8 % (ref 11.5–15.5)
WBC: 6 10*3/uL (ref 4.0–10.5)
nRBC: 0 % (ref 0.0–0.2)

## 2023-04-13 LAB — BASIC METABOLIC PANEL
Anion gap: 8 (ref 5–15)
Anion gap: 10 (ref 5–15)
BUN: 14 mg/dL (ref 8–23)
BUN: 11 mg/dL (ref 8–23)
CO2: 27 mmol/L (ref 22–32)
CO2: 25 mmol/L (ref 22–32)
Calcium: 8.7 mg/dL — ABNORMAL LOW (ref 8.9–10.3)
Calcium: 8.3 mg/dL — ABNORMAL LOW (ref 8.9–10.3)
Chloride: 104 mmol/L (ref 98–111)
Chloride: 101 mmol/L (ref 98–111)
Creatinine, Ser: 0.58 mg/dL — ABNORMAL LOW (ref 0.61–1.24)
Creatinine, Ser: 0.65 mg/dL (ref 0.61–1.24)
GFR, Estimated: >60 mL/min (ref >60)
GFR, Estimated: >60 mL/min (ref >60)
Glucose, Bld: 148 mg/dL — ABNORMAL HIGH (ref 70–99)
Glucose, Bld: 104 mg/dL — ABNORMAL HIGH (ref 70–99)
Potassium: 4 mmol/L (ref 3.5–5.1)
Potassium: 3.7 mmol/L (ref 3.5–5.1)
Sodium: 139 mmol/L (ref 135–145)
Sodium: 136 mmol/L (ref 135–145)

## 2023-04-13 LAB — HEMOGLOBIN AND HEMATOCRIT, BLOOD
HCT: 41.4 % (ref 39.0–52.0)
Hemoglobin: 13.1 g/dL (ref 13.0–17.0)

## 2023-04-13 LAB — GLUCOSE, CAPILLARY
Glucose-Capillary: 140 mg/dL — ABNORMAL HIGH (ref 70–99)
Glucose-Capillary: 129 mg/dL — ABNORMAL HIGH (ref 70–99)
Glucose-Capillary: 101 mg/dL — ABNORMAL HIGH (ref 70–99)
Glucose-Capillary: 128 mg/dL — ABNORMAL HIGH (ref 70–99)

## 2023-04-13 LAB — PROTIME-INR
INR: 1.1 (ref 0.8–1.2)
Prothrombin Time: 14.6 s (ref 11.4–15.2)

## 2023-04-13 SURGERY — NEPHROLITHOTOMY PERCUTANEOUS
Anesthesia: General | Laterality: Left

## 2023-04-13 MED ORDER — PROPOFOL 10 MG/ML IV BOLUS
INTRAVENOUS | Status: AC
  Filled 2023-04-13: qty 20

## 2023-04-13 MED ORDER — SUGAMMADEX SODIUM 200 MG/2ML IV SOLN
INTRAVENOUS | Status: DC | PRN
  Administered 2023-04-13: 200 mg via INTRAVENOUS

## 2023-04-13 MED ORDER — PHENYLEPHRINE HCL-NACL 20-0.9 MG/250ML-% IV SOLN
INTRAVENOUS | Status: DC | PRN
  Administered 2023-04-13: 20 ug/min via INTRAVENOUS

## 2023-04-13 MED ORDER — HYDROCODONE-ACETAMINOPHEN 5-325 MG PO TABS: 1.0000 | ORAL_TABLET | ORAL | Status: DC | PRN

## 2023-04-13 MED ORDER — IOHEXOL 300 MG/ML  SOLN
50.0000 mL | Freq: Once | INTRAMUSCULAR | Status: AC | PRN
  Administered 2023-04-13: 50 mL via INTRAVENOUS

## 2023-04-13 MED ORDER — LIDOCAINE HCL (PF) 2 % IJ SOLN
INTRAMUSCULAR | Status: AC
  Filled 2023-04-13: qty 5

## 2023-04-13 MED ORDER — ONDANSETRON HCL 4 MG/2ML IJ SOLN: 4.0000 mg | Freq: Once | INTRAMUSCULAR | Status: DC | PRN

## 2023-04-13 MED ORDER — SODIUM CHLORIDE 0.9 % IV SOLN: INTRAVENOUS | Status: DC

## 2023-04-13 MED ORDER — INSULIN ASPART 100 UNIT/ML IJ SOLN
0.0000 [IU] | Freq: Three times a day (TID) | INTRAMUSCULAR | Status: DC
Start: 2023-04-14 — End: 2023-04-14

## 2023-04-13 MED ORDER — HYDROMORPHONE HCL 1 MG/ML IJ SOLN: 0.2500 mg | INTRAMUSCULAR | Status: DC | PRN

## 2023-04-13 MED ORDER — TRANEXAMIC ACID-NACL 1000-0.7 MG/100ML-% IV SOLN
INTRAVENOUS | Status: DC | PRN
  Administered 2023-04-13: 1000 mg via INTRAVENOUS

## 2023-04-13 MED ORDER — DIPHENHYDRAMINE HCL 50 MG/ML IJ SOLN
INTRAMUSCULAR | Status: AC
  Filled 2023-04-13: qty 1

## 2023-04-13 MED ORDER — FENTANYL CITRATE (PF) 100 MCG/2ML IJ SOLN
INTRAMUSCULAR | Status: DC | PRN
  Administered 2023-04-13: 50 ug via INTRAVENOUS
  Administered 2023-04-13 (×2): 25 ug via INTRAVENOUS

## 2023-04-13 MED ORDER — LIDOCAINE-EPINEPHRINE 1 %-1:100000 IJ SOLN
INTRAMUSCULAR | Status: AC
  Filled 2023-04-13: qty 1

## 2023-04-13 MED ORDER — PHENYLEPHRINE HCL (PRESSORS) 10 MG/ML IV SOLN
INTRAVENOUS | Status: AC
Start: 2023-04-13 — End: ?
  Filled 2023-04-13: qty 1

## 2023-04-13 MED ORDER — CEFAZOLIN SODIUM-DEXTROSE 2-4 GM/100ML-% IV SOLN
2.0000 g | INTRAVENOUS | Status: AC
  Administered 2023-04-13: 2 g via INTRAVENOUS
  Filled 2023-04-13: qty 100

## 2023-04-13 MED ORDER — OXYCODONE HCL 5 MG/5ML PO SOLN: 5.0000 mg | Freq: Once | ORAL | Status: DC | PRN

## 2023-04-13 MED ORDER — IOHEXOL 300 MG/ML  SOLN
INTRAMUSCULAR | Status: DC | PRN
  Administered 2023-04-13: 30 mL
  Administered 2023-04-13: 10 mL

## 2023-04-13 MED ORDER — OXYBUTYNIN CHLORIDE 5 MG PO TABS
ORAL_TABLET | ORAL | Status: AC
Start: 2023-04-13 — End: 2023-04-14
  Filled 2023-04-13: qty 1

## 2023-04-13 MED ORDER — HYDROMORPHONE HCL 1 MG/ML IJ SOLN
INTRAMUSCULAR | Status: AC
  Administered 2023-04-13: 0.5 mg via INTRAVENOUS
  Filled 2023-04-13: qty 1

## 2023-04-13 MED ORDER — MIDAZOLAM HCL 2 MG/2ML IJ SOLN
INTRAMUSCULAR | Status: AC | PRN
  Administered 2023-04-13 (×6): 1 mg via INTRAVENOUS

## 2023-04-13 MED ORDER — SODIUM CHLORIDE 0.9 % IR SOLN
Status: DC | PRN
  Administered 2023-04-13: 12000 mL

## 2023-04-13 MED ORDER — LISINOPRIL 10 MG PO TABS
10.0000 mg | ORAL_TABLET | Freq: Every day | ORAL | Status: DC
  Administered 2023-04-13: 10 mg via ORAL
  Filled 2023-04-13: qty 1

## 2023-04-13 MED ORDER — DOCUSATE SODIUM 100 MG PO CAPS
100.0000 mg | ORAL_CAPSULE | Freq: Two times a day (BID) | ORAL | Status: DC
  Filled 2023-04-13: qty 1

## 2023-04-13 MED ORDER — PHENYLEPHRINE HCL (PRESSORS) 10 MG/ML IV SOLN
INTRAVENOUS | Status: DC | PRN
  Administered 2023-04-13: 100 ug via INTRAVENOUS

## 2023-04-13 MED ORDER — TRANEXAMIC ACID-NACL 1000-0.7 MG/100ML-% IV SOLN
INTRAVENOUS | Status: AC
  Filled 2023-04-13: qty 100

## 2023-04-13 MED ORDER — TRANEXAMIC ACID-NACL 1000-0.7 MG/100ML-% IV SOLN: 1000.0000 mg | INTRAVENOUS | Status: DC

## 2023-04-13 MED ORDER — MIDAZOLAM HCL 5 MG/5ML IJ SOLN
INTRAMUSCULAR | Status: DC | PRN
  Administered 2023-04-13: 1 mg via INTRAVENOUS

## 2023-04-13 MED ORDER — SODIUM CHLORIDE 0.9 % IV SOLN
INTRAVENOUS | Status: AC
  Administered 2023-04-13: 2 g
  Filled 2023-04-13: qty 20

## 2023-04-13 MED ORDER — ACETAMINOPHEN 325 MG PO TABS: 650.0000 mg | ORAL_TABLET | ORAL | Status: DC | PRN

## 2023-04-13 MED ORDER — CHLORHEXIDINE GLUCONATE 0.12 % MT SOLN
15.0000 mL | Freq: Once | OROMUCOSAL | Status: AC
  Administered 2023-04-13: 15 mL via OROMUCOSAL

## 2023-04-13 MED ORDER — DIPHENHYDRAMINE HCL 50 MG/ML IJ SOLN
INTRAMUSCULAR | Status: AC | PRN
  Administered 2023-04-13: 25 mg via INTRAVENOUS

## 2023-04-13 MED ORDER — OXYBUTYNIN CHLORIDE 5 MG PO TABS
5.0000 mg | ORAL_TABLET | Freq: Three times a day (TID) | ORAL | Status: DC | PRN
  Administered 2023-04-13: 5 mg via ORAL

## 2023-04-13 MED ORDER — DEXAMETHASONE SODIUM PHOSPHATE 10 MG/ML IJ SOLN
INTRAMUSCULAR | Status: DC | PRN
  Administered 2023-04-13: 8 mg via INTRAVENOUS

## 2023-04-13 MED ORDER — ROCURONIUM BROMIDE 10 MG/ML (PF) SYRINGE
PREFILLED_SYRINGE | INTRAVENOUS | Status: AC
  Filled 2023-04-13: qty 10

## 2023-04-13 MED ORDER — FENTANYL CITRATE (PF) 250 MCG/5ML IJ SOLN
INTRAMUSCULAR | Status: AC
  Filled 2023-04-13: qty 5

## 2023-04-13 MED ORDER — MIDAZOLAM HCL 2 MG/2ML IJ SOLN
INTRAMUSCULAR | Status: AC
  Filled 2023-04-13: qty 6

## 2023-04-13 MED ORDER — DIPHENHYDRAMINE HCL 50 MG/ML IJ SOLN: 12.5000 mg | Freq: Four times a day (QID) | INTRAMUSCULAR | Status: DC | PRN

## 2023-04-13 MED ORDER — SENNA 8.6 MG PO TABS
1.0000 | ORAL_TABLET | Freq: Two times a day (BID) | ORAL | Status: DC
  Filled 2023-04-13: qty 1

## 2023-04-13 MED ORDER — OXYCODONE HCL 5 MG PO TABS: 5.0000 mg | ORAL_TABLET | Freq: Once | ORAL | Status: DC | PRN

## 2023-04-13 MED ORDER — OXYBUTYNIN CHLORIDE 5 MG PO TABS: 5.0000 mg | ORAL_TABLET | Freq: Three times a day (TID) | ORAL | Status: DC | PRN

## 2023-04-13 MED ORDER — OXYCODONE HCL 5 MG PO TABS
5.0000 mg | ORAL_TABLET | Freq: Every day | ORAL | 0 refills | Status: DC | PRN
  Filled 2023-04-13: qty 15, 15d supply, fill #0

## 2023-04-13 MED ORDER — DEXAMETHASONE SODIUM PHOSPHATE 10 MG/ML IJ SOLN
INTRAMUSCULAR | Status: AC
  Filled 2023-04-13: qty 1

## 2023-04-13 MED ORDER — TRIPLE ANTIBIOTIC 3.5-400-5000 EX OINT: 1.0000 | TOPICAL_OINTMENT | Freq: Three times a day (TID) | CUTANEOUS | Status: DC | PRN

## 2023-04-13 MED ORDER — AMISULPRIDE (ANTIEMETIC) 5 MG/2ML IV SOLN: 10.0000 mg | Freq: Once | INTRAVENOUS | Status: DC | PRN

## 2023-04-13 MED ORDER — LIDOCAINE-EPINEPHRINE 1 %-1:100000 IJ SOLN
20.0000 mL | Freq: Once | INTRAMUSCULAR | Status: AC
  Administered 2023-04-13: 20 mL via INTRADERMAL

## 2023-04-13 MED ORDER — INSULIN ASPART 100 UNIT/ML IJ SOLN: 0.0000 [IU] | Freq: Every day | INTRAMUSCULAR | Status: DC

## 2023-04-13 MED ORDER — TAMSULOSIN HCL 0.4 MG PO CAPS
0.4000 mg | ORAL_CAPSULE | Freq: Every day | ORAL | 1 refills | Status: DC
  Filled 2023-04-13: qty 15, 15d supply, fill #0

## 2023-04-13 MED ORDER — ZOLPIDEM TARTRATE 5 MG PO TABS: 5.0000 mg | ORAL_TABLET | Freq: Every evening | ORAL | Status: DC | PRN

## 2023-04-13 MED ORDER — MIDAZOLAM HCL 2 MG/2ML IJ SOLN
INTRAMUSCULAR | Status: AC
  Filled 2023-04-13: qty 2

## 2023-04-13 MED ORDER — ORAL CARE MOUTH RINSE: 15.0000 mL | Freq: Once | OROMUCOSAL | Status: AC

## 2023-04-13 MED ORDER — ROCURONIUM BROMIDE 100 MG/10ML IV SOLN
INTRAVENOUS | Status: DC | PRN
  Administered 2023-04-13: 70 mg via INTRAVENOUS
  Administered 2023-04-13: 20 mg via INTRAVENOUS

## 2023-04-13 MED ORDER — FENTANYL CITRATE (PF) 100 MCG/2ML IJ SOLN
INTRAMUSCULAR | Status: AC
  Filled 2023-04-13: qty 4

## 2023-04-13 MED ORDER — MORPHINE SULFATE (PF) 2 MG/ML IV SOLN: 2.0000 mg | INTRAVENOUS | Status: DC | PRN

## 2023-04-13 MED ORDER — IOHEXOL 300 MG/ML  SOLN
50.0000 mL | Freq: Once | INTRAMUSCULAR | Status: AC | PRN
  Administered 2023-04-13: 40 mL

## 2023-04-13 MED ORDER — ACETAMINOPHEN 500 MG PO TABS
1000.0000 mg | ORAL_TABLET | Freq: Once | ORAL | Status: AC
  Administered 2023-04-13: 1000 mg via ORAL
  Filled 2023-04-13: qty 2

## 2023-04-13 MED ORDER — LACTATED RINGERS IV SOLN: INTRAVENOUS | Status: DC

## 2023-04-13 MED ORDER — ONDANSETRON HCL 4 MG/2ML IJ SOLN
INTRAMUSCULAR | Status: DC | PRN
  Administered 2023-04-13: 4 mg via INTRAVENOUS

## 2023-04-13 MED ORDER — PROPOFOL 10 MG/ML IV BOLUS
INTRAVENOUS | Status: DC | PRN
  Administered 2023-04-13: 100 mg via INTRAVENOUS
  Administered 2023-04-13: 30 mg via INTRAVENOUS

## 2023-04-13 MED ORDER — IOHEXOL 300 MG/ML  SOLN
50.0000 mL | Freq: Once | INTRAMUSCULAR | Status: AC | PRN
  Administered 2023-04-13: 30 mL

## 2023-04-13 MED ORDER — ONDANSETRON HCL 4 MG/2ML IJ SOLN: 4.0000 mg | INTRAMUSCULAR | Status: DC | PRN

## 2023-04-13 MED ORDER — LIDOCAINE HCL (CARDIAC) PF 100 MG/5ML IV SOSY
PREFILLED_SYRINGE | INTRAVENOUS | Status: DC | PRN
  Administered 2023-04-13: 60 mg via INTRAVENOUS

## 2023-04-13 MED ORDER — DIPHENHYDRAMINE HCL 12.5 MG/5ML PO ELIX: 12.5000 mg | ORAL_SOLUTION | Freq: Four times a day (QID) | ORAL | Status: DC | PRN

## 2023-04-13 MED ORDER — ATORVASTATIN CALCIUM 10 MG PO TABS: 10.0000 mg | ORAL_TABLET | ORAL | Status: DC

## 2023-04-13 MED ORDER — PHENYLEPHRINE 80 MCG/ML (10ML) SYRINGE FOR IV PUSH (FOR BLOOD PRESSURE SUPPORT)
PREFILLED_SYRINGE | INTRAVENOUS | Status: AC
  Filled 2023-04-13: qty 10

## 2023-04-13 MED ORDER — FENTANYL CITRATE (PF) 100 MCG/2ML IJ SOLN
INTRAMUSCULAR | Status: AC | PRN
  Administered 2023-04-13 (×4): 50 ug via INTRAVENOUS

## 2023-04-13 MED ORDER — SODIUM CHLORIDE 0.9 % IV SOLN
2.0000 g | INTRAVENOUS | Status: DC
Start: 2023-04-13 — End: 2023-04-13
  Filled 2023-04-13: qty 20

## 2023-04-13 SURGICAL SUPPLY — 45 items
BAG COUNTER SPONGE SURGICOUNT (BAG) IMPLANT
BAG URINE DRAIN 2000ML AR STRL (UROLOGICAL SUPPLIES) IMPLANT
BASKET ZERO TIP NITINOL 2.4FR (BASKET) IMPLANT
BENZOIN TINCTURE PRP APPL 2/3 (GAUZE/BANDAGES/DRESSINGS) ×2 IMPLANT
BLADE SURG 15 STRL LF DISP TIS (BLADE) ×2 IMPLANT
CATH FOLEY 2W COUNCIL 20FR 5CC (CATHETERS) IMPLANT
CATH ROBINSON RED A/P 20FR (CATHETERS) IMPLANT
CATH URETERAL DUAL LUMEN 10F (MISCELLANEOUS) ×2 IMPLANT
CATH X-FORCE N30 NEPHROSTOMY (TUBING) ×2 IMPLANT
CHLORAPREP W/TINT 26 (MISCELLANEOUS) ×2 IMPLANT
COVER BACK TABLE 60X90IN (DRAPES) ×2 IMPLANT
COVER SURGICAL LIGHT HANDLE (MISCELLANEOUS) IMPLANT
DERMABOND ADVANCED .7 DNX6 (GAUZE/BANDAGES/DRESSINGS) IMPLANT
DRAPE C-ARM 42X120 X-RAY (DRAPES) ×2 IMPLANT
DRAPE LINGEMAN PERC (DRAPES) ×2 IMPLANT
DRAPE SURG IRRIG POUCH 19X23 (DRAPES) ×2 IMPLANT
DRSG TEGADERM 8X12 (GAUZE/BANDAGES/DRESSINGS) IMPLANT
GAUZE PAD ABD 8X10 STRL (GAUZE/BANDAGES/DRESSINGS) ×4 IMPLANT
GAUZE SPONGE 4X4 12PLY STRL (GAUZE/BANDAGES/DRESSINGS) IMPLANT
GLOVE BIO SURGEON STRL SZ7.5 (GLOVE) ×2 IMPLANT
GOWN STRL REUS W/ TWL XL LVL3 (GOWN DISPOSABLE) ×2 IMPLANT
GUIDEWIRE AMPLAZ .035X145 (WIRE) ×4 IMPLANT
KIT BASIN OR (CUSTOM PROCEDURE TRAY) ×2 IMPLANT
KIT PROBE TRILOGY 3.9X350 (MISCELLANEOUS) IMPLANT
KIT TURNOVER KIT A (KITS) IMPLANT
LASER FIB FLEXIVA PULSE ID 365 (Laser) IMPLANT
LUBRICANT JELLY K Y 4OZ (MISCELLANEOUS) ×2 IMPLANT
MANIFOLD NEPTUNE II (INSTRUMENTS) ×2 IMPLANT
NS IRRIG 1000ML POUR BTL (IV SOLUTION) ×2 IMPLANT
PACK CYSTO (CUSTOM PROCEDURE TRAY) IMPLANT
SPONGE T-LAP 4X18 ~~LOC~~+RFID (SPONGE) ×2 IMPLANT
STENT ENDOURETEROTOMY 7-14 26C (STENTS) IMPLANT
STENT URET 6FRX28 CONTOUR (STENTS) IMPLANT
SUT CHROMIC 3 0 SH 27 (SUTURE) IMPLANT
SUT MNCRL AB 3-0 PS2 18 (SUTURE) IMPLANT
SUT SILK 2 0 30 PSL (SUTURE) IMPLANT
SYR 10ML LL (SYRINGE) ×2 IMPLANT
SYR 20ML LL LF (SYRINGE) ×2 IMPLANT
TOWEL OR 17X26 10 PK STRL BLUE (TOWEL DISPOSABLE) ×2 IMPLANT
TRACTIP FLEXIVA PULS ID 200XHI (Laser) IMPLANT
TRAY FOLEY MTR SLVR 16FR STAT (SET/KITS/TRAYS/PACK) ×2 IMPLANT
TUBING CONNECTING 10 (TUBING) ×2 IMPLANT
TUBING STONE CATCHER TRILOGY (MISCELLANEOUS) IMPLANT
TUBING UROLOGY SET (TUBING) ×2 IMPLANT
WATER STERILE IRR 1000ML POUR (IV SOLUTION) ×2 IMPLANT

## 2023-04-13 NOTE — Transfer of Care (Signed)
 Immediate Anesthesia Transfer of Care Note  Patient: Adam Ware, Dr.  Nigel Bridgeman) Performed: LEFT NEPHROLITHOTOMY PERCUTANEOUS (Left)  Patient Location: PACU  Anesthesia Type:General  Level of Consciousness: oriented, drowsy, and patient cooperative  Airway & Oxygen Therapy: Patient Spontanous Breathing and Patient connected to face mask oxygen  Post-op Assessment: Report given to RN and Post -op Vital signs reviewed and stable  Post vital signs: Reviewed and stable  Last Vitals:  Vitals Value Taken Time  BP 150/91 04/13/23 1301  Temp    Pulse 94 04/13/23 1304  Resp 16 04/13/23 1304  SpO2 100 % 04/13/23 1304  Vitals shown include unfiled device data.  Last Pain:  Vitals:   04/13/23 0834  TempSrc:   PainSc: 0-No pain         Complications: No notable events documented.

## 2023-04-13 NOTE — Anesthesia Procedure Notes (Signed)
 Procedure Name: Intubation Date/Time: 04/13/2023 11:36 AM  Performed by: Garth Bigness, CRNAPre-anesthesia Checklist: Patient identified, Emergency Drugs available, Suction available and Patient being monitored Patient Re-evaluated:Patient Re-evaluated prior to induction Oxygen Delivery Method: Circle system utilized Preoxygenation: Pre-oxygenation with 100% oxygen Induction Type: IV induction Ventilation: Mask ventilation without difficulty Laryngoscope Size: Mac and 4 Grade View: Grade II Tube type: Oral Tube size: 7.5 mm Number of attempts: 1 Airway Equipment and Method: Stylet and Oral airway Placement Confirmation: ETT inserted through vocal cords under direct vision, positive ETCO2 and breath sounds checked- equal and bilateral Secured at: 24 cm Tube secured with: Tape Dental Injury: Teeth and Oropharynx as per pre-operative assessment

## 2023-04-13 NOTE — Anesthesia Postprocedure Evaluation (Signed)
 Anesthesia Post Note  Patient: Adam Ware, Dr.  Nigel Bridgeman) Performed: LEFT NEPHROLITHOTOMY PERCUTANEOUS (Left)     Patient location during evaluation: PACU Anesthesia Type: General Level of consciousness: awake and alert, oriented and patient cooperative Pain management: pain level controlled Vital Signs Assessment: post-procedure vital signs reviewed and stable Respiratory status: spontaneous breathing, nonlabored ventilation and respiratory function stable Cardiovascular status: blood pressure returned to baseline and stable Postop Assessment: no apparent nausea or vomiting Anesthetic complications: no   No notable events documented.  Last Vitals:  Vitals:   04/13/23 1300 04/13/23 1315  BP: (!) 150/91 (!) 144/81  Pulse: 95 97  Resp: 12   Temp: (!) 35.9 C   SpO2: 100% 94%    Last Pain:  Vitals:   04/13/23 1315  TempSrc:   PainSc: 0-No pain                 Lannie Fields

## 2023-04-13 NOTE — Discharge Instructions (Signed)
 Discharge instructions following PCNL  Call your doctor for: Fevers greater than 100.5 Severe nausea or vomiting Increasing pain not controlled by pain medication Increasing redness or drainage from incisions Decreased urine output or a catheter is no longer draining  The number for questions is [phone].  Activity: Gradually increase activity with short frequent walks, 3-4 times a day.  Avoid strenuous activities, like sports, lawn-mowing, or heavy lifting (more than 10-15 pounds).  Wear loose, comfortable clothing that pull or kink the tube or tubes.  Do not drive while taking pain medication, or until your doctor permitts it.  Bathing and dressing changes: You should not shower for 48 hours after surgery.  Do not soak your back in a bathtub.  Drainage bag care: You may be discharged with a drainage bag around the site of your surgery.  The drainage bag should be secured such that it never pulls or loosens to prevent it from leaking.  It is important to wash her hands before and after emptying the drainage bag to help prevent the spread of infection.  The drainage bag should be emptied as needed.  When the wound stops draining or it is manageable with a dry gauze dressing, you can remove the bag.  If your tube in the back was removed, you should expect to have some leakage of fluid from the back incision.  This should slowly decrease and stop over the next couple of days.  If you have severe pain or persistent leakage, please call the number above.  Otherwise, your dressing can be changed 1-2 times daily or more if needed.  Diet: It is extremely important to drink plenty of fluids after surgery, especially water.  You may resume your regular diet, unless otherwise instructed.  Medications: May take Tylenol (acetaminophen) or ibuprofen (Advil, Motrin) as directed over-the-counter. Take any prescriptions as directed.  Follow-up appointments: Follow-up appointment will be scheduled  with Dr. Alvester Morin

## 2023-04-13 NOTE — H&P (Deleted)
 Operative Note   Preoperative diagnosis:  1.  Left renal calculus   Postoperative diagnosis: 1.  Left renal calculus    Procedure(s): 1.  Left percutaneous nephrolithotomy   Surgeon: Modena Slater, MD   Assistants: None   Anesthesia: General   Complications: None immediate   EBL: 100 cc   Specimens: 1.  Renal calculus   Drains/Catheters: 1.  6 x 28 double-J ureteral stent 2.  Foley catheter   Intraoperative findings: Large left dominant renal pelvic calculus and several smaller calculi.  Stone completely cleared of all stone burden.   Indication: 73 year old male with a large left renal calculus presents for the previously mentioned operation.   Description of procedure:   The patient was identified and consent was obtained.  The patient was taken to the operating room and placed in the supine position.  The patient was placed under general anesthesia.  Perioperative antibiotics were administered.  The patient was placed in prone position and all pressure points were padded.  Patient was prepped and draped in a standard sterile fashion and a timeout was performed.   A Super Stiff wire was advanced through the nephroureteral stent down to the bladder under fluoroscopic guidance and the nephroureteral stent was removed.  A dual-lumen ureteral catheter was advanced over the Super Stiff wire into the renal pelvis and an antegrade nephrostogram was performed.  This showed a well opacified kidney and a filling defect corresponding to the stone of interest.  I advanced the dual-lumen ureteral catheter into the proximal ureter under fluoroscopic guidance followed by placement of a second Super Stiff wire down to the bladder under fluoroscopic guidance.  The dual-lumen catheter was removed.  An incision was made alongside the wires.  The balloon dilator was then advanced over one of the wires and into the renal pelvis fluoroscopic guidance and the tract was dilated to a pressure of 18.  The  sheath was advanced over the balloon and into the renal pelvis.  The balloon was withdrawn keeping the sheath in place.  The nephroscope was advanced into the kidney and the stone of interest was encountered.  The stone was then removed with a combination of pneumatic and ultrasound with suction and graspers.All stone was removed and there was no evidence of any other stones within the kidney.  I again inspected the kidney with a flexible cystoscope as well as the proximal ureter.  No other calculi were seen.   A 6 x 28 double-J ureteral stent was advanced over 1 of the wires under fluoroscopic guidance and the wire was withdrawn.  Fluoroscopy confirmed a good coil within the bladder as well as a good coil in the renal pelvis proximally.  Other wire was withdrawn.  I again confirmed proper position proximally and distally.  Approximately was visualized with the nephroscope and then I removed the nephroscope and sheath.   Incision was closed with running 3-0 Monocryl and Dermabond.   The patient tolerated procedure well and was stable postoperatively.   Plan: Patient will remain under observation overnight. Stent to be removed in 1 week.

## 2023-04-13 NOTE — H&P (Signed)
 H&P  Chief Complaint: Left renal calculus  History of Present Illness: 73 year old male with a large left renal calculi presents for left PCNL.  Past Medical History:  Diagnosis Date   Adrenal tumor    rigth   Arthritis    Cancer (HCC)    basal cell   Diabetes mellitus without complication (HCC)    type 2; tx metformin and wkly ozempic on Sundays   Hair loss    History of kidney stones    surgery to remove   Hyperlipidemia    tx w/atorvastatin   Hypertension    tx w/lisinopril   Insomnia    tx w/ambien   Past Surgical History:  Procedure Laterality Date   APPENDECTOMY     COLONOSCOPY     I & D KNEE WITH POLY EXCHANGE Right 01/24/2022   Procedure: IRRIGATION AND DEBRIDEMENT RIGHT KNEE WITH POLY EXCHANGE;  Surgeon: Nadara Mustard, MD;  Location: [facility] OR;  Service: Orthopedics;  Laterality: Right;   LITHOTRIPSY     MINOR CARPAL TUNNEL Left 11/07/2022   Procedure: MINOR CARPAL TUNNEL;  Surgeon: Samuella Cota, MD;  Location: [facility];  Service: Orthopedics;  Laterality: Left;  NEEDS RNFA (OPEN)   REPLACEMENT TOTAL KNEE Right    TOTAL KNEE REVISION Right 04/22/2017   Procedure: REVISION RIGHT TOTAL KNEE ARTHROPLASTY VS. POLY EXCHANGE;  Surgeon: Nadara Mustard, MD;  Location: [facility] OR;  Service: Orthopedics;  Laterality: Right;    Home Medications:  Medications Prior to Admission  Medication Sig Dispense Refill Last Dose/Taking   finasteride (PROSCAR) 5 MG tablet Take 1 tablet (5 mg total) by mouth daily. (Patient taking differently: Take 1.25 mg by mouth daily.) 90 tablet 1 04/12/2023   lisinopril (ZESTRIL) 10 MG tablet Take 1 tablet by mouth daily. 90 tablet 1 04/12/2023   metFORMIN (GLUCOPHAGE-XR) 500 MG 24 hr tablet Take 1 tablet (500 mg total) by mouth daily with breakfast. 90 tablet 0 04/12/2023   tirzepatide (MOUNJARO) 15 MG/0.5ML Pen Inject 15 mg into the skin once a week. (Patient taking differently: Inject 15 mg into the skin every Sunday.) 6 mL 0 04/05/2023    atorvastatin (LIPITOR) 10 MG tablet Take 1 tablet (10 mg total) by mouth 3 (three) times a week. (Patient taking differently: Take 10 mg by mouth every Monday, Wednesday, and Friday. In the morning) 90 tablet 1 04/10/2023   dexamethasone (DECADRON) 1 MG tablet Take 1 tablet by mouth As Directed. Take at 11 pm the day prior to having your cortisol level at 8AM the next day (Patient not taking: Reported on 04/02/2023) 1 tablet 0 Not Taking   tadalafil (CIALIS) 20 MG tablet Take 1 tablet (20 mg total) by mouth daily as needed for erectile dysfunction. 30 tablet 1 More than a month   zolpidem (AMBIEN CR) 6.25 MG CR tablet Take 1 tablet (6.25 mg total) by mouth at bedtime as needed for sleep. 90 tablet 0 More than a month   Allergies:  Allergies  Allergen Reactions   Sulfa Antibiotics Other (See Comments)    Fixed drug reaction    Family History  Problem Relation Age of Onset   Cancer Mother    Diabetes Mother    Mental illness Mother    Heart disease Father    Arthritis Brother    Social History:  reports that he has never smoked. He has never been exposed to tobacco smoke. He has never used smokeless tobacco. He reports current alcohol use of about 1.0  standard drink of alcohol per week. He reports that he does not use drugs.  ROS: A complete review of systems was performed.  All systems are negative except for pertinent findings as noted. ROS   Physical Exam:  Vital signs in last 24 hours: Temp:  [98.2 F (36.8 C)] 98.2 F (36.8 C) (02/24 0753) Pulse Rate:  [78-102] 100 (02/24 1050) Resp:  [6-18] 15 (02/24 1050) BP: (120-147)/(68-86) 134/77 (02/24 1050) SpO2:  [97 %-100 %] 100 % (02/24 1050) General:  Alert and oriented, No acute distress HEENT: Normocephalic, atraumatic Neck: No JVD or lymphadenopathy Cardiovascular: Regular rate and rhythm Lungs: Regular rate and effort Abdomen: Soft, nontender, nondistended, no abdominal masses Back: No CVA tenderness Extremities: No  edema Neurologic: Grossly intact  Laboratory Data:  Results for orders placed or performed during the hospital encounter of 04/13/23 (from the past 24 hours)  Glucose, capillary     Status: Abnormal   Collection Time: 04/13/23  7:59 AM  Result Value Ref Range   Glucose-Capillary 140 (H) 70 - 99 mg/dL  CBC with Differential/Platelet     Status: None   Collection Time: 04/13/23  9:07 AM  Result Value Ref Range   WBC 6.0 4.0 - 10.5 K/uL   RBC 4.56 4.22 - 5.81 MIL/uL   Hemoglobin 13.2 13.0 - 17.0 g/dL   HCT 16.1 09.6 - 04.5 %   MCV 89.5 80.0 - 100.0 fL   MCH 28.9 26.0 - 34.0 pg   MCHC 32.4 30.0 - 36.0 g/dL   RDW 40.9 81.1 - 91.4 %   Platelets 241 150 - 400 K/uL   nRBC 0.0 0.0 - 0.2 %   Neutrophils Relative % 49 %   Neutro Abs 3.0 1.7 - 7.7 K/uL   Lymphocytes Relative 42 %   Lymphs Abs 2.5 0.7 - 4.0 K/uL   Monocytes Relative 7 %   Monocytes Absolute 0.4 0.1 - 1.0 K/uL   Eosinophils Relative 1 %   Eosinophils Absolute 0.1 0.0 - 0.5 K/uL   Basophils Relative 1 %   Basophils Absolute 0.0 0.0 - 0.1 K/uL   Immature Granulocytes 0 %   Abs Immature Granulocytes 0.02 0.00 - 0.07 K/uL  Basic metabolic panel     Status: Abnormal   Collection Time: 04/13/23  9:07 AM  Result Value Ref Range   Sodium 139 135 - 145 mmol/L   Potassium 4.0 3.5 - 5.1 mmol/L   Chloride 104 98 - 111 mmol/L   CO2 27 22 - 32 mmol/L   Glucose, Bld 148 (H) 70 - 99 mg/dL   BUN 14 8 - 23 mg/dL   Creatinine, Ser 7.82 (L) 0.61 - 1.24 mg/dL   Calcium 8.7 (L) 8.9 - 10.3 mg/dL   GFR, Estimated >95 >62 mL/min   Anion gap 8 5 - 15  Protime-INR     Status: None   Collection Time: 04/13/23  9:07 AM  Result Value Ref Range   Prothrombin Time 14.6 11.4 - 15.2 seconds   INR 1.1 0.8 - 1.2   No results found for this or any previous visit (from the past 240 hours). Creatinine: Recent Labs    04/13/23 0907  CREATININE 0.58*    Impression/Assessment:  Left renal calculi  Plan:  Proceed with left PCNL.  Risk  benefits discussed as previously documented.  Ray Church, III 04/13/2023, 10:59 AM

## 2023-04-13 NOTE — Op Note (Signed)
 Operative Note   Preoperative diagnosis:  1.  Left renal calculus   Postoperative diagnosis: 1.  Left renal calculus    Procedure(s): 1.  Left percutaneous nephrolithotomy   Surgeon: Modena Slater, MD   Assistants: None   Anesthesia: General   Complications: None immediate   EBL: 100 cc   Specimens: 1.  Renal calculus   Drains/Catheters: 1.  6 x 28 double-J ureteral stent 2.  Foley catheter   Intraoperative findings: Large left dominant renal pelvic calculus and several smaller calculi.  Stone completely cleared of all stone burden.   Indication: 73 year old male with a large left renal calculus presents for the previously mentioned operation.   Description of procedure:   The patient was identified and consent was obtained.  The patient was taken to the operating room and placed in the supine position.  The patient was placed under general anesthesia.  Perioperative antibiotics were administered.  The patient was placed in prone position and all pressure points were padded.  Patient was prepped and draped in a standard sterile fashion and a timeout was performed.   A Super Stiff wire was advanced through the nephroureteral stent down to the bladder under fluoroscopic guidance and the nephroureteral stent was removed.  A dual-lumen ureteral catheter was advanced over the Super Stiff wire into the renal pelvis and an antegrade nephrostogram was performed.  This showed a well opacified kidney and a filling defect corresponding to the stone of interest.  I advanced the dual-lumen ureteral catheter into the proximal ureter under fluoroscopic guidance followed by placement of a second Super Stiff wire down to the bladder under fluoroscopic guidance.  The dual-lumen catheter was removed.  An incision was made alongside the wires.  The balloon dilator was then advanced over one of the wires and into the renal pelvis fluoroscopic guidance and the tract was dilated to a pressure of 18.  The  sheath was advanced over the balloon and into the renal pelvis.  The balloon was withdrawn keeping the sheath in place.  The nephroscope was advanced into the kidney and the stone of interest was encountered.  The stone was then removed with a combination of pneumatic and ultrasound with suction and graspers.All stone was removed and there was no evidence of any other stones within the kidney.  I again inspected the kidney with a flexible cystoscope as well as the proximal ureter.  No other calculi were seen.   A 6 x 28 double-J ureteral stent was advanced over 1 of the wires under fluoroscopic guidance and the wire was withdrawn.  Fluoroscopy confirmed a good coil within the bladder as well as a good coil in the renal pelvis proximally.  Other wire was withdrawn.  I again confirmed proper position proximally and distally.  Approximately was visualized with the nephroscope and then I removed the nephroscope and sheath.   Incision was closed with running 3-0 Monocryl and Dermabond.   The patient tolerated procedure well and was stable postoperatively.   Plan: Patient will remain under observation overnight. Stent to be removed in 1 week.

## 2023-04-13 NOTE — Procedures (Signed)
 Vascular and Interventional Radiology Procedure Note  Patient: Adam Ware, Dr. DOB: September 02, 1950 Medical Record Number: [id number] Note Date/Time: 04/13/23 9:46 AM   Performing Physician: Roanna Banning, MD Assistant(s): None  Diagnosis: L Renal calculus. Planned OR for PCNL  Procedure:  LEFT NEPHROURETERAL ACCESS LEFT ANTEROGRADE NEPHROSTOGRAM  Anesthesia: Conscious Sedation Complications: None Estimated Blood Loss: Minimal Specimens:  None  Findings:  Successful placement of a 5 F nephroureteral tube into the left kidney(s), with tip within the urinary bladder.  Plan: to OR for PCNL.  See detailed procedure note with images in PACS. The patient tolerated the procedure well without incident or complication and was returned to PACU in stable condition.    Roanna Banning, MD Vascular and Interventional Radiology [facility] Radiology   Pager. [phone] Clinic. [phone]

## 2023-04-14 ENCOUNTER — Encounter (HOSPITAL_COMMUNITY): Payer: Self-pay | Admitting: Urology

## 2023-04-14 ENCOUNTER — Other Ambulatory Visit: Payer: Self-pay

## 2023-04-14 DIAGNOSIS — N200 Calculus of kidney: Secondary | ICD-10-CM | POA: Diagnosis not present

## 2023-04-14 LAB — BASIC METABOLIC PANEL
Anion gap: 8 (ref 5–15)
BUN: 13 mg/dL (ref 8–23)
CO2: 26 mmol/L (ref 22–32)
Calcium: 8.6 mg/dL — ABNORMAL LOW (ref 8.9–10.3)
Chloride: 106 mmol/L (ref 98–111)
Creatinine, Ser: 0.72 mg/dL (ref 0.61–1.24)
GFR, Estimated: >60 mL/min (ref >60)
Glucose, Bld: 161 mg/dL — ABNORMAL HIGH (ref 70–99)
Potassium: 4.1 mmol/L (ref 3.5–5.1)
Sodium: 140 mmol/L (ref 135–145)

## 2023-04-14 LAB — HEMOGLOBIN AND HEMATOCRIT, BLOOD
HCT: 37.7 % — ABNORMAL LOW (ref 39.0–52.0)
Hemoglobin: 12.1 g/dL — ABNORMAL LOW (ref 13.0–17.0)

## 2023-04-14 LAB — GLUCOSE, CAPILLARY: Glucose-Capillary: 115 mg/dL — ABNORMAL HIGH (ref 70–99)

## 2023-04-14 NOTE — Progress Notes (Signed)
   04/14/23 0850  TOC Brief Assessment  Insurance and Status Reviewed  Patient has primary care physician Yes  Home environment has been reviewed Resides alone in a single family home  Prior level of function: Independent at baseline with ADLs  Prior/Current Home Services No current home services  Social Drivers of Health Review SDOH reviewed no interventions necessary  Readmission risk has been reviewed Yes  Transition of care needs no transition of care needs at this time

## 2023-04-14 NOTE — Discharge Summary (Signed)
 Patient ID: Adam Ware, Dr. MRN: [id number] DOB/AGE: 75-Aug-1952 75 y.o.  Admit date: 04/13/2023 Discharge date: 04/14/2023  Primary Care Physician:  Etta Grandchild, MD  Discharge Diagnoses:   Renal stone  Discharge Medications: Allergies as of 04/14/2023       Reactions   Sulfa Antibiotics Other (See Comments)   Fixed drug reaction        Medication List     STOP taking these medications    dexamethasone 1 MG tablet Commonly known as: DECADRON       TAKE these medications    atorvastatin 10 MG tablet Commonly known as: LIPITOR Take 1 tablet (10 mg total) by mouth 3 (three) times a week.   finasteride 5 MG tablet Commonly known as: PROSCAR Take 1 tablet (5 mg total) by mouth daily. What changed: how much to take   lisinopril 10 MG tablet Commonly known as: ZESTRIL Take 1 tablet by mouth daily.   metFORMIN 500 MG 24 hr tablet Commonly known as: GLUCOPHAGE-XR Take 1 tablet (500 mg total) by mouth daily with breakfast.   Mounjaro 15 MG/0.5ML Pen Generic drug: tirzepatide Inject 15 mg into the skin once a week. What changed: when to take this   oxyCODONE 5 MG immediate release tablet Commonly known as: Oxy IR/ROXICODONE Take 1 tablet (5 mg total) by mouth daily as needed (for pain score of 1-4).   tadalafil 20 MG tablet Commonly known as: CIALIS Take 1 tablet (20 mg total) by mouth daily as needed for erectile dysfunction.   tamsulosin 0.4 MG Caps capsule Commonly known as: FLOMAX Take 1 capsule (0.4 mg total) by mouth daily.   zolpidem 6.25 MG CR tablet Commonly known as: AMBIEN CR Take 1 tablet (6.25 mg total) by mouth at bedtime as needed for sleep.         Significant Diagnostic Studies:  DG C-Arm 1-60 Min-No Report Result Date: 04/13/2023 Fluoroscopy was utilized by the requesting physician.  No radiographic interpretation.   IR URETERAL STENT LEFT NEW ACCESS W/O SEP NEPHROSTOMY CATH Result Date: 04/13/2023 INDICATION: Renal stones,  access for LEFT percutaneous nephrolithotomy. EXAM: Procedures; 1. LEFT ANTEGRADE NEPHROSTOGRAM 2. LEFT NEPHROURETERAL CATHETER FOR NEPHROLITHOTOMY ACCESS COMPARISON:  CT AP, 03/30/2023 MEDICATIONS: Rocephin 2 gm IV; The antibiotic was administered in an appropriate time frame prior to skin puncture. 25 mg Benadryl IV ANESTHESIA/SEDATION: Moderate (conscious) sedation was employed during this procedure. A total of Versed 6 mg and Fentanyl 200 mcg was administered intravenously. Moderate Sedation Time: 57 minutes. The patient's level of consciousness and vital signs were monitored continuously by radiology nursing throughout the procedure under my direct supervision. CONTRAST:  50mL OMNIPAQUE IOHEXOL 300 MG/ML SOLN, 40mL OMNIPAQUE IOHEXOL 300 MG/ML SOLN, 30mL OMNIPAQUE IOHEXOL 300 MG/ML SOLN - Administered into the renal collecting system. FLUOROSCOPY TIME:  Fluoroscopic dose; 62 mGy COMPLICATIONS: None immediate. PROCEDURE: Informed written consent was obtained from the patient after a discussion of the risks, benefits, and alternatives to treatment. The LEFT flank region was prepped with sterile prep in a usual fashion, and a sterile drape was applied covering the operative field. A sterile gown and sterile gloves were used for the procedure. A timeout was performed prior to the initiation of the procedure. A pre procedural spot fluoroscopic image was obtained of the upper abdomen. Ultrasound scanning performed of the kidney was negative for significant hydronephrosis. As such, the stone within inferior pole was targeted using a combination of ultrasound and fluoroscopically with a 22 gauge Chiba needle. Access  to the collecting system was confirmed with advancement of a Nitrex wire into the collecting system. The needle was exchanged for the inner 3 Fr catheter from an Accustick set and contrast injection confirmed access. An Accustick set was utilized to dilate the tract and was subsequently exchanged for a Kumpe  catheter over a Bentson wire. The Kumpe catheter was advanced down the ureter and into the urinary bladder. Postprocedural spot radiographs were obtained in various obliquities and the catheter was sutured to the skin. The catheter was capped and a dressing was placed. The patient tolerated the procedure well without immediate post procedural complication. FINDINGS: *Pre procedural spot radiographic images demonstrates stone burden at the LEFT inferior renal collecting system. *Ultrasound scanning demonstrated decompressed LEFT renal collecting system without hydronephrosis. *Antegrade nephrostogram demonstrating a partially-obstructive LEFT ureteropelvic junction (UPJ) renal calculi, with additional calculi at the LEFT inferior and superior renal poles. IMPRESSION: Successful placement of a LEFT 5 Fr nephroureteral catheter, with tip at the level of the urinary bladder for access during nephrolithotomy. PLAN: To OR for percutaneous nephrolithotomy with urology, Dr. Alvester Morin. Roanna Banning, MD [facility] Interventional Radiology [facility] Radiology Electronically Signed   By: Roanna Banning M.D.   On: 04/13/2023 11:36    Brief H and P: For complete details please refer to admission H and P, but in brief, Dr. Susann Givens  Presented to [facility] for scheduled percutaneous nephrolithotomy on 04/13/2023.  Large left renal stone was removed with a combination of pneumatic and ultrasound.  Flexible cystoscopy was performed at the conclusion of the procedure and the kidney and proximal ureter were shown to the clear of any stone material.  A double-J stent was placed at the conclusion of the procedure.  This will be removed in clinic next week with Dr. Alvester Morin.  Morning labs show a small expected amount of drop in hemoglobin.  Hematuria is quite mild.  Patient reports that pain level of 0 and feels well.  Shared decision was made to discharge home.  Follow-up plans were reviewed.    Hospital Course:   Principal Problem:   Renal calculi   Day of Discharge BP 134/71 (BP Location: Left Arm)   Pulse 97   Temp 99.7 F (37.6 C) (Oral)   Resp 20   Ht 6' (1.829 m)   SpO2 94%   BMI 26.45 kg/m   Results for orders placed or performed during the hospital encounter of 04/13/23 (from the past 24 hours)  CBC with Differential/Platelet     Status: None   Collection Time: 04/13/23  9:07 AM  Result Value Ref Range   WBC 6.0 4.0 - 10.5 K/uL   RBC 4.56 4.22 - 5.81 MIL/uL   Hemoglobin 13.2 13.0 - 17.0 g/dL   HCT 16.1 09.6 - 04.5 %   MCV 89.5 80.0 - 100.0 fL   MCH 28.9 26.0 - 34.0 pg   MCHC 32.4 30.0 - 36.0 g/dL   RDW 40.9 81.1 - 91.4 %   Platelets 241 150 - 400 K/uL   nRBC 0.0 0.0 - 0.2 %   Neutrophils Relative % 49 %   Neutro Abs 3.0 1.7 - 7.7 K/uL   Lymphocytes Relative 42 %   Lymphs Abs 2.5 0.7 - 4.0 K/uL   Monocytes Relative 7 %   Monocytes Absolute 0.4 0.1 - 1.0 K/uL   Eosinophils Relative 1 %   Eosinophils Absolute 0.1 0.0 - 0.5 K/uL   Basophils Relative 1 %   Basophils Absolute 0.0 0.0 -  0.1 K/uL   Immature Granulocytes 0 %   Abs Immature Granulocytes 0.02 0.00 - 0.07 K/uL  Basic metabolic panel     Status: Abnormal   Collection Time: 04/13/23  9:07 AM  Result Value Ref Range   Sodium 139 135 - 145 mmol/L   Potassium 4.0 3.5 - 5.1 mmol/L   Chloride 104 98 - 111 mmol/L   CO2 27 22 - 32 mmol/L   Glucose, Bld 148 (H) 70 - 99 mg/dL   BUN 14 8 - 23 mg/dL   Creatinine, Ser 1.61 (L) 0.61 - 1.24 mg/dL   Calcium 8.7 (L) 8.9 - 10.3 mg/dL   GFR, Estimated >09 >60 mL/min   Anion gap 8 5 - 15  Protime-INR     Status: None   Collection Time: 04/13/23  9:07 AM  Result Value Ref Range   Prothrombin Time 14.6 11.4 - 15.2 seconds   INR 1.1 0.8 - 1.2  Glucose, capillary     Status: Abnormal   Collection Time: 04/13/23  1:06 PM  Result Value Ref Range   Glucose-Capillary 129 (H) 70 - 99 mg/dL  Basic metabolic panel     Status: Abnormal   Collection Time: 04/13/23  5:12 PM  Result  Value Ref Range   Sodium 136 135 - 145 mmol/L   Potassium 3.7 3.5 - 5.1 mmol/L   Chloride 101 98 - 111 mmol/L   CO2 25 22 - 32 mmol/L   Glucose, Bld 104 (H) 70 - 99 mg/dL   BUN 11 8 - 23 mg/dL   Creatinine, Ser 4.54 0.61 - 1.24 mg/dL   Calcium 8.3 (L) 8.9 - 10.3 mg/dL   GFR, Estimated >09 >81 mL/min   Anion gap 10 5 - 15  Hemoglobin and hematocrit, blood     Status: None   Collection Time: 04/13/23  5:12 PM  Result Value Ref Range   Hemoglobin 13.1 13.0 - 17.0 g/dL   HCT 19.1 47.8 - 29.5 %  Glucose, capillary     Status: Abnormal   Collection Time: 04/13/23  5:53 PM  Result Value Ref Range   Glucose-Capillary 101 (H) 70 - 99 mg/dL  Glucose, capillary     Status: Abnormal   Collection Time: 04/13/23  9:13 PM  Result Value Ref Range   Glucose-Capillary 128 (H) 70 - 99 mg/dL  Basic metabolic panel     Status: Abnormal   Collection Time: 04/14/23  5:05 AM  Result Value Ref Range   Sodium 140 135 - 145 mmol/L   Potassium 4.1 3.5 - 5.1 mmol/L   Chloride 106 98 - 111 mmol/L   CO2 26 22 - 32 mmol/L   Glucose, Bld 161 (H) 70 - 99 mg/dL   BUN 13 8 - 23 mg/dL   Creatinine, Ser 6.21 0.61 - 1.24 mg/dL   Calcium 8.6 (L) 8.9 - 10.3 mg/dL   GFR, Estimated >30 >86 mL/min   Anion gap 8 5 - 15  Hemoglobin and hematocrit, blood     Status: Abnormal   Collection Time: 04/14/23  5:05 AM  Result Value Ref Range   Hemoglobin 12.1 (L) 13.0 - 17.0 g/dL   HCT 57.8 (L) 46.9 - 62.9 %    Physical Exam: General: Alert and awake oriented x3 not in any acute distress. Resp: normal work of breathing CV: No cyanosis GU: mild hematuria, light clear red with no clot material@ Extremities: no cyanosis, clubbing or edema noted bilaterally   Disposition:  Home this am  Diet:  regular  Activity:  as tolerated, 20 lb lifting restriction until follow up   DISCHARGE FOLLOW-UP  Dr. Alvester Morin in clinic in one week for stent removal  Time spent on Discharge:   A total of 36 minutes was spent on  discharge, planning, and education  Signed: Scherrie Bateman Estefania Kamiya 04/14/2023, 8:19 AM

## 2023-04-16 ENCOUNTER — Telehealth: Payer: Self-pay | Admitting: Medical

## 2023-04-16 ENCOUNTER — Encounter: Payer: Self-pay | Admitting: Sports Medicine

## 2023-04-16 ENCOUNTER — Ambulatory Visit: Payer: PPO | Admitting: Sports Medicine

## 2023-04-16 ENCOUNTER — Other Ambulatory Visit (INDEPENDENT_AMBULATORY_CARE_PROVIDER_SITE_OTHER): Payer: Self-pay

## 2023-04-16 DIAGNOSIS — G8929 Other chronic pain: Secondary | ICD-10-CM

## 2023-04-16 DIAGNOSIS — S43002A Unspecified subluxation of left shoulder joint, initial encounter: Secondary | ICD-10-CM | POA: Diagnosis not present

## 2023-04-16 DIAGNOSIS — M25512 Pain in left shoulder: Secondary | ICD-10-CM

## 2023-04-16 DIAGNOSIS — M25412 Effusion, left shoulder: Secondary | ICD-10-CM

## 2023-04-16 NOTE — Progress Notes (Signed)
 Patient says that he has been going to physical therapy but has stopped in the last week due to his pain. He says that the right shoulder is also a bit painful for him now. 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

## 2023-04-16 NOTE — Progress Notes (Addendum)
 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  Warnell has had issues with the left shoulder for a number of months now.  Initially he was having more sensation of clicking and subluxation of the bicep tendon.  Back on 02/23/2023 I did ultrasound the shoulder which showed dynamic biceps subluxation as well as a moderate glenohumeral joint injection, I did aspirate under ultrasound guidance about 15 cc of fluid followed by subsequent corticosteroid injection.  After this he did hold on activity for a few weeks and then restarted his physical therapy because of pain.  He is having less of the subluxating/clicking sensation but is performing activity modification.  Pertinent ROS were reviewed with the patient and found to be negative unless otherwise specified above in HPI.   Assessment & Plan: Visit Diagnoses:  1. Chronic left shoulder pain   2. Subluxation of tendon of long head of left biceps   3. Shoulder effusion, left    Plan: Impression is chronic left shoulder pain with recurrent effusion.  Previous and current ultrasound showed biceps tendon subluxation with findings suggestive of a THL tear predisposing to his subluxation. His exam also suggest likely supraspinatus pathology - the effusion does restrict evaluation of [place] but there does appear to be some high-grade tearing in the tendon, although no retraction present. Back in January we did proceed with effusion aspiration and injection, however he has a recurrent complex effusion within the shoulder joint as well with some free floating cartilaginous structures present and continued pain, which suggest  underlying notable pathology. We discussed that next step would be obtaining an MRI of the shoulder to better evaluate the cause of his effusion and internal abnormality for possible surgical management.  I will order MRI arthrogram of the left shoulder and he will follow-up with Dr. August Saucer to review this and discuss next steps.  Would hold on further therapy until MRI returns.  Follow-up: Return for Make appt with Dr. August Saucer for shoulder after MRI shoulder.   Meds & Orders: No orders of the defined types were placed in this encounter.   Orders Placed This Encounter  Procedures   [place] Extrem Up Left Ltd   MR Shoulder Left w/ contrast   Arthrogram     Procedures: No procedures performed      Clinical History:   He reports that he has never smoked. He has never been exposed to tobacco smoke. He has never used smokeless tobacco.  Recent Labs    08/12/22 1715 02/13/23 0927 04/01/23 1633  HGBA1C 6.6* 6.3 6.7*    Objective:    Physical Exam  Gen: Well-appearing, in no acute distress; non-toxic CV: Well-perfused. Warm.  Resp: Breathing unlabored on room air; no wheezing. Psych: Fluid speech in conversation; appropriate affect; normal thought process  Ortho Exam - Left shoulder: There is a moderate effusion over the anterior glenohumeral joint.  here is tenderness palpating within the bicipital groove and with speeds test as well as empty can testing.  There is pain with resisted abduction although no weakness with this or external rotation.   Imaging:  LUE-Shoulder [place] 04/16/23: Limited musculoskeletal ultrasound  of the left shoulder was performed today.  Evaluation of the anterior joint shows a moderate glenohumeral joint effusion.  The bicep tendon is seen in both short and long axis without high-grade tearing although under dynamic views this does appear to sublux out of the bicipital groove, which raises my suspicion for a THL tear in the setting of proximal biceps subluxation. There is  hypoechoic fluid indicative of an effusion/synovitis around the bicep tendon. The joint effusion does have a complex structure with multiple cartilaginous structures. The supraspinatus tendon is seen near the footprint with hypoechoic change which could indicate a full-thickness tear from the anterior to posterior position, although the communicating GHJ-SAJ effusion interferes with full visualization. I cannot appreciate any significant tendon retraction. The infraspinatus tendon has tendinopathic changes with calcification at the distal insertion with evidence of partial tearing.  There is mod-severe subscapularis tendinopathy with partial-thickness tearing but no full-thickness tearing or tendon retraction noted.     *Left shoulder effusion^  Past Medical/Family/Surgical/Social History: Medications & Allergies reviewed per EMR, new medications updated. Patient Active Problem List   Diagnosis Date Noted   Renal calculi 04/13/2023   Gross hematuria 02/13/2023   Acute prostatitis 02/13/2023   Right adrenal mass (HCC) 02/13/2023   Left renal stone 02/13/2023   High serum lipoprotein(a) 09/05/2022   Encounter for general adult medical examination with abnormal findings 06/12/2021   Carpal tunnel syndrome, left upper limb 11/28/2020   Carpal tunnel syndrome, right upper limb 11/28/2020   Erectile dysfunction due to arterial insufficiency 09/24/2020   Benign prostatic hyperplasia with weak urinary stream 05/05/2018   Hyperlipidemia LDL goal <100 05/05/2018   Pure hyperglyceridemia 05/05/2018   Psychophysiological insomnia 05/05/2018   Lumbar radiculopathy 07/15/2017   Hyperlipidemia associated with type 2 diabetes mellitus (HCC) 06/10/2017   Essential hypertension 06/10/2017   Type 2 diabetes mellitus without complication, without long-term current use of insulin (HCC) 06/10/2017   Spondylolisthesis, lumbar region 04/21/2017   Past Medical History:  Diagnosis Date   Adrenal tumor     rigth   Arthritis    Cancer (HCC)    basal cell   Diabetes mellitus without complication (HCC)    type 2; tx metformin and wkly ozempic on Sundays   Hair loss    History of kidney stones    surgery to remove   Hyperlipidemia    tx w/atorvastatin   Hypertension    tx w/lisinopril   Insomnia    tx w/ambien   Family History  Problem Relation Age of Onset   Cancer Mother    Diabetes Mother    Mental illness Mother    Heart disease Father    Arthritis Brother    Past Surgical History:  Procedure Laterality Date   APPENDECTOMY     COLONOSCOPY     I & D KNEE WITH POLY EXCHANGE Right 01/24/2022   Procedure: IRRIGATION AND DEBRIDEMENT RIGHT KNEE WITH POLY EXCHANGE;  Surgeon: Nadara Mustard, MD;  Location: [facility];  Service: Orthopedics;  Laterality: Right;   IR URETERAL STENT LEFT NEW ACCESS W/O SEP NEPHROSTOMY CATH  04/13/2023   LITHOTRIPSY     MINOR CARPAL TUNNEL Left 11/07/2022   Procedure: MINOR CARPAL TUNNEL;  Surgeon: Samuella Cota, MD;  Location: [facility];  Service: Orthopedics;  Laterality: Left;  NEEDS RNFA (OPEN)   NEPHROLITHOTOMY Left 04/13/2023   Procedure: LEFT NEPHROLITHOTOMY PERCUTANEOUS;  Surgeon: Crista Elliot, MD;  Location: [facility];  Service: Urology;  Laterality: Left;  150  MINUTE CASE   REPLACEMENT TOTAL KNEE Right    TOTAL KNEE REVISION Right 04/22/2017   Procedure: REVISION RIGHT TOTAL KNEE ARTHROPLASTY VS. POLY EXCHANGE;  Surgeon: Nadara Mustard, MD;  Location: [facility];  Service: Orthopedics;  Laterality: Right;   Social History   Occupational History   Occupation: Part time  Tobacco Use   Smoking status: Never    Passive exposure: Never   Smokeless tobacco: Never  Vaping Use   Vaping status: Never Used  Substance and Sexual Activity   Alcohol use: Yes    Alcohol/week: 1.0 standard drink of alcohol    Types: 1 Glasses of wine per week    Comment: wine daily   Drug use: No   Sexual activity: Not Currently    Partners: Male

## 2023-04-16 NOTE — Telephone Encounter (Signed)
 I spoke to patient on Sunday 04/12/23 and today 04/16/23 regarding his kidney stones.   We reviewed his 02/13/23 CT renal study, 04/06/23 study and compared findings to 05/2013 scan.

## 2023-04-17 ENCOUNTER — Encounter: Payer: Self-pay | Admitting: Sports Medicine

## 2023-04-17 DIAGNOSIS — G8929 Other chronic pain: Secondary | ICD-10-CM

## 2023-04-17 NOTE — Addendum Note (Signed)
 Addended by: Jamie Kato III on: 04/17/2023 12:50 AM   Modules accepted: Orders

## 2023-04-20 DIAGNOSIS — N202 Calculus of kidney with calculus of ureter: Secondary | ICD-10-CM | POA: Diagnosis not present

## 2023-04-21 ENCOUNTER — Telehealth: Payer: Self-pay

## 2023-04-21 DIAGNOSIS — N202 Calculus of kidney with calculus of ureter: Secondary | ICD-10-CM | POA: Diagnosis not present

## 2023-04-21 NOTE — Telephone Encounter (Signed)
 MRI currently scheduled for the 24th. Will call patient to schedule follow up appt to review scan.

## 2023-04-21 NOTE — Telephone Encounter (Signed)
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 I talked with Lorin Picket today and he is aware and I put the MRI-arthrogram as STAT so it can be done soon. Scott asked that I shoot you a message so you could help get this done quickly, so here's the message! Let me know if you need anything from me. As soon as MRI is scheduled, I told Gedalia to make an appt with August Saucer.  Annabelle Harman

## 2023-04-22 ENCOUNTER — Other Ambulatory Visit (HOSPITAL_COMMUNITY): Payer: Self-pay

## 2023-04-22 ENCOUNTER — Encounter: Payer: Self-pay | Admitting: Internal Medicine

## 2023-04-22 ENCOUNTER — Other Ambulatory Visit: Payer: Self-pay

## 2023-04-22 ENCOUNTER — Encounter: Payer: Self-pay | Admitting: Pharmacist

## 2023-04-22 ENCOUNTER — Other Ambulatory Visit: Payer: Self-pay | Admitting: Internal Medicine

## 2023-04-22 DIAGNOSIS — E119 Type 2 diabetes mellitus without complications: Secondary | ICD-10-CM

## 2023-04-22 DIAGNOSIS — N5201 Erectile dysfunction due to arterial insufficiency: Secondary | ICD-10-CM

## 2023-04-22 MED ORDER — TADALAFIL 20 MG PO TABS
20.0000 mg | ORAL_TABLET | Freq: Every day | ORAL | 1 refills | Status: AC | PRN
  Filled 2023-04-22: qty 30, 30d supply, fill #0

## 2023-04-22 MED ORDER — MOUNJARO 15 MG/0.5ML ~~LOC~~ SOAJ
15.0000 mg | SUBCUTANEOUS | 0 refills | Status: DC
Start: 2023-04-22 — End: 2023-07-20
  Filled 2023-04-22 – 2023-04-27 (×5): qty 6, 84d supply, fill #0

## 2023-04-22 NOTE — Telephone Encounter (Signed)
 scheduled

## 2023-04-27 ENCOUNTER — Other Ambulatory Visit (HOSPITAL_COMMUNITY): Payer: Self-pay

## 2023-04-27 ENCOUNTER — Ambulatory Visit
Admission: RE | Admit: 2023-04-27 | Discharge: 2023-04-27 | Disposition: A | Discharge status: Home / Self Care | Source: Ambulatory Visit | Attending: Sports Medicine | Admitting: Sports Medicine

## 2023-04-27 DIAGNOSIS — M75122 Complete rotator cuff tear or rupture of left shoulder, not specified as traumatic: Secondary | ICD-10-CM | POA: Diagnosis not present

## 2023-04-27 DIAGNOSIS — M19012 Primary osteoarthritis, left shoulder: Secondary | ICD-10-CM | POA: Diagnosis not present

## 2023-04-27 DIAGNOSIS — M25412 Effusion, left shoulder: Secondary | ICD-10-CM | POA: Diagnosis not present

## 2023-04-27 DIAGNOSIS — S43002A Unspecified subluxation of left shoulder joint, initial encounter: Secondary | ICD-10-CM

## 2023-04-27 DIAGNOSIS — G8929 Other chronic pain: Secondary | ICD-10-CM

## 2023-04-27 DIAGNOSIS — M25512 Pain in left shoulder: Secondary | ICD-10-CM | POA: Diagnosis not present

## 2023-04-27 DIAGNOSIS — M7522 Bicipital tendinitis, left shoulder: Secondary | ICD-10-CM | POA: Diagnosis not present

## 2023-04-27 MED ORDER — IOPAMIDOL (ISOVUE-M 200) INJECTION 41%
10.0000 mL | Freq: Once | INTRAMUSCULAR | Status: AC
  Administered 2023-04-27: 10 mL via INTRA_ARTICULAR

## 2023-04-28 ENCOUNTER — Telehealth: Payer: Self-pay | Admitting: Pharmacy Technician

## 2023-04-28 ENCOUNTER — Other Ambulatory Visit (HOSPITAL_COMMUNITY): Payer: Self-pay

## 2023-04-28 ENCOUNTER — Other Ambulatory Visit: Payer: Self-pay

## 2023-04-28 NOTE — Telephone Encounter (Signed)
 Pharmacy Patient Advocate Encounter  Received notification from [facility] [employer] that Prior Authorization for [facility] 15MG  has been APPROVED from 04/28/23 to 04/27/24. Ran test claim, Copay is $117.50. This test claim was processed through [facility]- copay amounts may vary at other pharmacies due to pharmacy/plan contracts, or as the patient moves through the different stages of their insurance plan.   PA #/Case ID/Reference #: [id number]  THE COPAY OF $117.50 IS FOR A 3 MONTH SUPPLY

## 2023-04-29 NOTE — Telephone Encounter (Signed)
 Patient has been made aware.

## 2023-05-01 ENCOUNTER — Encounter: Payer: Self-pay | Admitting: Orthopedic Surgery

## 2023-05-01 ENCOUNTER — Ambulatory Visit: Admitting: Orthopedic Surgery

## 2023-05-01 DIAGNOSIS — S43002A Unspecified subluxation of left shoulder joint, initial encounter: Secondary | ICD-10-CM | POA: Diagnosis not present

## 2023-05-01 NOTE — Progress Notes (Signed)
 Office Visit Note   Patient: Adam Ware, Dr.           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  He is left-hand dominant but plays golf right-handed.  Injections have helped his shoulders in the past especially on the right-hand side.  He does have a history of diabetes which is well-controlled.  MRI scan of that left shoulder is reviewed.  Does show tearing of the upper border of the subscap with a subluxation of the biceps tendon.  The biceps tendon is on the medial aspect of the lesser tuberosity.  No significant glenohumeral joint arthritis visible on the MRI scan.  There is rotator cuff tearing of the supraspinatus at the anterior and posterior aspect of the footprint.  Both these tears are retracted to the mid portion of the humeral head apex.  There is a band of supraspinatus which remains attached.  No significant muscle atrophy in the subscap.  Mild atrophy of the supraspinatus.              ROS: All systems reviewed are negative as they relate to the chief complaint within the history of present illness.  Patient denies fevers or chills.  Assessment & Plan: Visit Diagnoses:  1. 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  These tears are retracted to the apex of the humeral head which puts them in a position where it starts to get much more difficult to mobilize and repair.  The tears do not appear traumatic but more degenerative in nature.  Biceps tendon also is likely giving Adam Ware  most of his symptoms.  Plan at this time is shoulder arthroscopy with biceps tendon release tenodesis and mini open rotator cuff tear repair likely augmented with patch based on the degenerative nature of the tears and the retracted nature of the tears.  Goal would be to avoid the need for reverse shoulder replacement down the road.  We may also need to put a few anchors in the anterior superior aspect of the subscapularis.  Risk and benefits are discussed include not limited to infection shoulder stiffness incomplete pain relief as well as incomplete return of function.  He did do very well with his knee replacements in the past.  Plan to use shoulder CPM for at least 2 weeks after surgery to prevent shoulder stiffness.  All questions answered  Follow-Up Instructions: No follow-ups on file.   Orders:  No orders of the defined types were placed in this encounter.  No orders of the defined types were placed in this encounter.     Procedures: No procedures performed   Clinical Data: No additional findings.  Objective: Vital Signs: There were no vitals taken for this visit.  Physical Exam:  Constitutional: Patient appears well-developed HEENT:  Head: Normocephalic Eyes:EOM are normal Neck: Normal range of motion Cardiovascular: Normal rate Pulmonary/chest: Effort normal Neurologic: Patient is alert  Skin: Skin is warm Psychiatric: Patient has normal mood and affect  Ortho Exam: Ortho exam demonstrates minimal crepitus with passive range of motion of that left shoulder.  Passive range of motion is 50/95/160.  Pretty good rotator cuff strength infraspinatus supraspinatus and subscap muscle testing.  Slightly weaker and more painful with empty can testing on the left compared to the right.  Not too much in terms of AC joint tenderness is present.  Right shoulder also has mild pain with range of motion but no restricted external rotation.  Rotator cuff strength also intact on this side.   Plan for right sided glenohumeral joint injection next week.  Specialty Comments:  MRI LUMBAR SPINE WITHOUT CONTRAST   TECHNIQUE:  Multiplanar, multisequence MR imaging of the lumbar spine was  performed. No intravenous contrast was administered.   COMPARISON: Lumbar spine radiographs 04/21/2017. CT abdomen and  pelvis 03/25/2014.   FINDINGS:  Segmentation: Standard.   Alignment: 8 mm anterolisthesis of L5 on S1 due to bilateral L5 pars  defects.   Vertebrae: No acute fracture or suspicious osseous lesion. Mild  left-sided L4 inferior endplate edema, likely degenerative with a  small Schmorl's node also present in this location.   Conus medullaris and cauda equina: Conus extends to the L1-2 level.  Conus and cauda equina appear normal.   Paraspinal and other soft tissues: Unremarkable.   Disc levels:   L1-2: Negative.   L2-3: Mild disc desiccation. Minimal disc bulging without stenosis.   L3-4: Mild disc desiccation. Minimal disc bulging and mild facet  arthrosis without stenosis.   L4-5: Mild disc desiccation. Mild disc bulging and moderate facet  hypertrophy result in mild left neural foraminal stenosis without  spinal stenosis. A 5 x 1 mm cyst is noted along the left ligamentum  flavum, not resulting in neural impingement.   L5-S1: Disc desiccation and severe disc space narrowing.  Anterolisthesis, disc space height loss, and bulging uncovered disc  result in severe bilateral neural foraminal stenosis with L5 nerve  root compression. No spinal stenosis.   IMPRESSION:  1. Bilateral L5 pars defects with grade 1 anterolisthesis, advanced  disc degeneration, and severe bilateral neural foraminal stenosis  with L5 nerve root compression.  2. Mild disc bulging and moderate facet hypertrophy at L4-5  resulting in mild left neural foraminal stenosis.    Electronically Signed  By: Adam Ware M.D.  On: 05/01/2017 17:09  Imaging: No results found.   PMFS  History: Patient Active Problem List   Diagnosis Date Noted   Renal calculi 04/13/2023   Gross hematuria 02/13/2023   Acute prostatitis 02/13/2023   Right adrenal mass (HCC) 02/13/2023   Left renal stone 02/13/2023   High serum lipoprotein(a) 09/05/2022   Encounter for general adult medical examination with abnormal findings 06/12/2021   Carpal tunnel syndrome, left upper limb 11/28/2020   Carpal tunnel syndrome, right upper limb 11/28/2020   Erectile dysfunction due to arterial insufficiency 09/24/2020   Benign prostatic hyperplasia with weak urinary stream 05/05/2018   Hyperlipidemia LDL goal <100 05/05/2018   Pure hyperglyceridemia 05/05/2018   Psychophysiological insomnia 05/05/2018   Lumbar radiculopathy 07/15/2017   Hyperlipidemia associated with type 2 diabetes mellitus (HCC) 06/10/2017   Essential hypertension 06/10/2017   Type 2 diabetes mellitus without complication, without long-term current use of insulin (HCC) 06/10/2017   Spondylolisthesis, lumbar region 04/21/2017   Past Medical History:  Diagnosis Date   Adrenal tumor    rigth   Arthritis    Cancer (HCC)  basal cell   Diabetes mellitus without complication (HCC)    type 2; tx metformin and wkly ozempic on Sundays   Hair loss    History of kidney stones    surgery to remove   Hyperlipidemia    tx w/atorvastatin   Hypertension    tx w/lisinopril   Insomnia    tx w/ambien    Family History  Problem Relation Age of Onset   Cancer Mother    Diabetes Mother    Mental illness Mother    Heart disease Father    Arthritis Brother     Past Surgical History:  Procedure Laterality Date   APPENDECTOMY     COLONOSCOPY     I & D KNEE WITH POLY EXCHANGE Right 01/24/2022   Procedure: IRRIGATION AND DEBRIDEMENT RIGHT KNEE WITH POLY EXCHANGE;  Surgeon: Nadara Mustard, MD;  Location: [facility];  Service: Orthopedics;  Laterality: Right;   IR URETERAL STENT LEFT NEW ACCESS W/O SEP NEPHROSTOMY CATH  04/13/2023    LITHOTRIPSY     MINOR CARPAL TUNNEL Left 11/07/2022   Procedure: MINOR CARPAL TUNNEL;  Surgeon: Samuella Cota, MD;  [facility];  Service: Orthopedics;  Laterality: Left;  NEEDS RNFA (OPEN)   NEPHROLITHOTOMY Left 04/13/2023   Procedure: LEFT NEPHROLITHOTOMY PERCUTANEOUS;  Surgeon: Crista Elliot, MD;  Location: [facility];  Service: Urology;  Laterality: Left;  150 MINUTE CASE   REPLACEMENT TOTAL KNEE Right    TOTAL KNEE REVISION Right 04/22/2017   Procedure: REVISION RIGHT TOTAL KNEE ARTHROPLASTY VS. POLY EXCHANGE;  Surgeon: Nadara Mustard, MD;  Location: [facility];  Service: Orthopedics;  Laterality: Right;   Social History   Occupational History   Occupation: Part time  Tobacco Use   Smoking status: Never    Passive exposure: Never   Smokeless tobacco: Never  Vaping Use   Vaping status: Never Used  Substance and Sexual Activity   Alcohol use: Yes    Alcohol/week: 1.0 standard drink of alcohol    Types: 1 Glasses of wine per week    Comment: wine daily   Drug use: No   Sexual activity: Not Currently    Partners: Male

## 2023-05-06 ENCOUNTER — Other Ambulatory Visit: Payer: Self-pay

## 2023-05-06 ENCOUNTER — Encounter: Payer: Self-pay | Admitting: Orthopedic Surgery

## 2023-05-06 ENCOUNTER — Ambulatory Visit: Admitting: Orthopedic Surgery

## 2023-05-06 DIAGNOSIS — M65911 Unspecified synovitis and tenosynovitis, right shoulder: Secondary | ICD-10-CM

## 2023-05-06 DIAGNOSIS — G8929 Other chronic pain: Secondary | ICD-10-CM | POA: Diagnosis not present

## 2023-05-06 DIAGNOSIS — M25511 Pain in right shoulder: Secondary | ICD-10-CM

## 2023-05-06 DIAGNOSIS — M7552 Bursitis of left shoulder: Secondary | ICD-10-CM

## 2023-05-06 MED ORDER — LIDOCAINE HCL 1 % IJ SOLN
5.0000 mL | INTRAMUSCULAR | Status: AC | PRN
Start: 2023-05-06 — End: 2023-05-06
  Administered 2023-05-06: 5 mL

## 2023-05-06 MED ORDER — BUPIVACAINE HCL 0.5 % IJ SOLN
9.0000 mL | INTRAMUSCULAR | Status: AC | PRN
  Administered 2023-05-06: 9 mL via INTRA_ARTICULAR

## 2023-05-06 MED ORDER — METHYLPREDNISOLONE ACETATE 40 MG/ML IJ SUSP
40.0000 mg | INTRAMUSCULAR | Status: AC | PRN
  Administered 2023-05-06: 40 mg via INTRA_ARTICULAR

## 2023-05-06 NOTE — Progress Notes (Signed)
   Procedure Note  Patient: Adam Ware, Dr.             Date of Birth: 1950/10/25           MRN: [id number]             Visit Date: 05/06/2023  Procedures: Visit Diagnoses:  1. Chronic right shoulder pain   2. Subacromial bursitis of left shoulder joint     Large Joint Inj: R glenohumeral on 05/06/2023 8:08 AM Indications: diagnostic evaluation and pain Details: 22 G 3.5 in needle, ultrasound-guided posterior approach  Arthrogram: No  Medications: 9 mL bupivacaine 0.5 %; 40 mg methylPREDNISolone acetate 40 MG/ML; 5 mL lidocaine 1 % Outcome: tolerated well, no immediate complications Procedure, treatment alternatives, risks and benefits explained, specific risks discussed. Consent was given by the patient. Immediately prior to procedure a time out was called to verify the correct patient, procedure, equipment, support staff and site/side marked as required. Patient was prepped and draped in the usual sterile fashion.     Patient presents for scheduled right shoulder intra-articular glenohumeral joint injection.  Having some right shoulder pain after clinic visit last week.  Glenohumeral joint injection performed today.  Ultrasound exam of the shoulder shows subscap to be a little thin but intact and the strength is good.  Biceps tendon in the groove.  Supraspinatus has partial-thickness tearing but no full-thickness tearing on ultrasound.  Definite thickening of the infraspinatus but again exam is good in that regard in terms of strength.  Plan at this time would be we can see how he does with the right shoulder intra-articular injection which I think has the highest possibility of helping him based on his left shoulder findings.  He is also having some left hand tingling and weakness.  Did have carpal tunnel release done 4 to 5 months ago.  Developed some recurrent symptoms after about 2 months.  He asked me about this particular problem and my recommendation him was for nerve conduction  study testing just to assess the median nerve more fully.  He is reporting some decreased strength in that left hand.  an injection which he is considering requesting without really determining the cause of these recurrent symptoms would not be my first choice

## 2023-05-11 ENCOUNTER — Other Ambulatory Visit: Payer: PPO

## 2023-05-13 ENCOUNTER — Other Ambulatory Visit: Payer: Self-pay | Admitting: Internal Medicine

## 2023-05-13 ENCOUNTER — Ambulatory Visit: Admitting: Orthopedic Surgery

## 2023-05-13 DIAGNOSIS — I10 Essential (primary) hypertension: Secondary | ICD-10-CM

## 2023-05-13 DIAGNOSIS — N401 Enlarged prostate with lower urinary tract symptoms: Secondary | ICD-10-CM

## 2023-05-13 DIAGNOSIS — E119 Type 2 diabetes mellitus without complications: Secondary | ICD-10-CM

## 2023-05-18 ENCOUNTER — Other Ambulatory Visit: Payer: Self-pay

## 2023-05-18 ENCOUNTER — Other Ambulatory Visit (HOSPITAL_COMMUNITY): Payer: Self-pay

## 2023-05-18 MED ORDER — FINASTERIDE 5 MG PO TABS
5.0000 mg | ORAL_TABLET | Freq: Every day | ORAL | 1 refills | Status: AC
Start: 2023-05-18 — End: ?
  Filled 2023-05-18: qty 90, 90d supply, fill #0
  Filled 2023-05-31 – 2023-08-14 (×2): qty 90, 90d supply, fill #1

## 2023-05-18 MED ORDER — LISINOPRIL 10 MG PO TABS
10.0000 mg | ORAL_TABLET | Freq: Every day | ORAL | 1 refills | Status: DC
  Filled 2023-05-18: qty 90, 90d supply, fill #0
  Filled 2023-08-14: qty 90, 90d supply, fill #1

## 2023-05-22 DIAGNOSIS — N202 Calculus of kidney with calculus of ureter: Secondary | ICD-10-CM | POA: Diagnosis not present

## 2023-05-31 ENCOUNTER — Other Ambulatory Visit: Payer: Self-pay | Admitting: Internal Medicine

## 2023-05-31 DIAGNOSIS — E119 Type 2 diabetes mellitus without complications: Secondary | ICD-10-CM

## 2023-06-01 ENCOUNTER — Other Ambulatory Visit (HOSPITAL_COMMUNITY): Payer: Self-pay

## 2023-06-05 NOTE — Progress Notes (Signed)
 PCP - Arcadio Knuckles, MD  Cardiologist - ***  PPM/ICD - *** Device Orders - *** Rep Notified - ***  Chest x-ray - *** EKG - 04-01-23 Stress Test - *** ECHO - *** Cardiac Cath - ***  CPAP - ***  GLP-1 -tirzepatide  (MOUNJARO )   Fasting Blood Sugar - *** Checks Blood Sugar ***/day  Blood Thinner Instructions: *** Aspirin  Instructions: ***  ERAS Protcol - ***  COVID TEST- ***  Anesthesia review: ***  Patient verbally denies any shortness of breath, fever, cough and chest pain during phone call   -------------  SDW INSTRUCTIONS given:  Your procedure is scheduled on June 11, 2023.  Report to [facility] Main Entrance "A" at 8:55 A.M., and check in at the Admitting office.  Call this number if you have problems the morning of surgery:  [phone]   Remember:  Do not eat after midnight the night before your surgery  You may drink clear liquids until 8:25 the morning of your surgery.   Clear liquids allowed are: Water, Non-Citrus Juices (without pulp), Carbonated Beverages, Clear Tea, Black Coffee Only, and Gatorade    Take these medicines the morning of surgery with A SIP OF WATER  finasteride  (PROSCAR )    As of today, STOP taking any Aspirin  (unless otherwise instructed by your surgeon) Aleve, Naproxen, Ibuprofen, Motrin, Advil, Goody's, BC's, all herbal medications, fish oil, and all vitamins.      WHAT DO I DO ABOUT MY DIABETES MEDICATION?   Do not take oral diabetes medicines metFORMIN  (GLUCOPHAGE -XR) the morning of surgery.   The day of surgery, do not take other diabetes injectables, including Byetta  (exenatide ), Bydureon  (exenatide  ER), Victoza (liraglutide), or Trulicity (dulaglutide).  If your CBG is greater than 220 mg/dL, you may take  of your sliding scale (correction) dose of insulin .   HOW TO MANAGE YOUR DIABETES BEFORE AND AFTER SURGERY  Why is it important to control my blood sugar before and after surgery? Improving blood sugar levels  before and after surgery helps healing and can limit problems. A way of improving blood sugar control is eating a healthy diet by:  Eating less sugar and carbohydrates  Increasing activity/exercise  Talking with your doctor about reaching your blood sugar goals High blood sugars (greater than 180 mg/dL) can raise your risk of infections and slow your recovery, so you will need to focus on controlling your diabetes during the weeks before surgery. Make sure that the doctor who takes care of your diabetes knows about your planned surgery including the date and location.  How do I manage my blood sugar before surgery? Check your blood sugar at least 4 times a day, starting 2 days before surgery, to make sure that the level is not too high or low.  Check your blood sugar the morning of your surgery when you wake up and every 2 hours until you get to the Short Stay unit.  If your blood sugar is less than 70 mg/dL, you will need to treat for low blood sugar: Do not take insulin . Treat a low blood sugar (less than 70 mg/dL) with  cup of clear juice (cranberry or apple), 4 glucose tablets, OR glucose gel. Recheck blood sugar in 15 minutes after treatment (to make sure it is greater than 70 mg/dL). If your blood sugar is not greater than 70 mg/dL on recheck, call [phone] for further instructions. Report your blood sugar to the short stay nurse when you get to Short Stay.  If you  are admitted to the hospital after surgery: Your blood sugar will be checked by the staff and you will probably be given insulin  after surgery (instead of oral diabetes medicines) to make sure you have good blood sugar levels. The goal for blood sugar control after surgery is 80-180 mg/dL.        Oral Hygiene is also important to reduce your risk of infection.  Remember - BRUSH YOUR TEETH THE MORNING OF SURGERY WITH YOUR REGULAR TOOTHPASTE  [place]- Preparing for Total Shoulder Arthroplasty  Before surgery, you  can play an important role. Because skin is not sterile, your skin needs to be as free of germs as possible. You can reduce the number of germs on your skin by using the following products.   Benzoyl Peroxide Gel  o Reduces the number of germs present on the skin  o Applied twice a day to shoulder area starting two days before surgery   Chlorhexidine  Gluconate (CHG) Soap (instructions listed above on how to wash with CHG Soap)  o An antiseptic cleaner that kills germs and bonds with the skin to continue killing germs even after washing  o Used for showering the night before surgery and morning of surgery   ==================================================================  Please follow these instructions carefully:  BENZOYL PEROXIDE 5% GEL  Please do not use if you have an allergy to benzoyl peroxide. If your skin becomes reddened/irritated stop using the benzoyl peroxide.  Starting two days before surgery, apply as follows:  1. Apply benzoyl peroxide in the morning and at night. Apply after taking a shower. If you are not taking a shower clean entire shoulder front, back, and side along with the armpit with a clean wet washcloth.  2. Place a quarter-sized dollop on your SHOULDER and rub in thoroughly, making sure to cover the front, back, and side of your shoulder, along with the armpit.   2 Days prior to Surgery First Dose on _____________ Morning Second Dose on ______________ Night  Day Before Surgery First Dose on ______________ Morning Night before surgery wash (entire body except face and private areas) with CHG Soap THEN Second Dose on ____________ Night   Morning of Surgery  wash BODY AGAIN with CHG Soap   4. Do NOT apply benzoyl peroxide gel on the day of surgery   [place]- Preparing For Surgery  Before surgery, you can play an important role. Because skin is not sterile, your skin needs to be as free of germs as possible. You can reduce the number of  germs on your skin by washing with CHG (chlorahexidine gluconate) Soap before surgery.  CHG is an antiseptic cleaner which kills germs and bonds with the skin to continue killing germs even after washing.     Please do not use if you have an allergy to CHG or antibacterial soaps. If your skin becomes reddened/irritated stop using the CHG.  Do not shave (including legs and underarms) for at least 48 hours prior to first CHG shower. It is OK to shave your face.  Please follow these instructions carefully.     Shower the NIGHT BEFORE SURGERY and the MORNING OF SURGERY with CHG Soap.   If you chose to wash your hair, wash your hair first as usual with your normal shampoo. After you shampoo, rinse your hair and body thoroughly to remove the shampoo.  Then [employer] and genitals (private parts) with your normal soap and rinse thoroughly to remove soap.  After that Use CHG Soap  as you would any other liquid soap. You can apply CHG directly to the skin and wash gently with a scrungie or a clean washcloth.   Apply the CHG Soap to your body ONLY FROM THE NECK DOWN.  Do not use on open wounds or open sores. Avoid contact with your eyes, ears, mouth and genitals (private parts). Wash Face and genitals (private parts)  with your normal soap.   Wash thoroughly, paying special attention to the area where your surgery will be performed.  Thoroughly rinse your body with warm water from the neck down.  DO NOT shower/wash with your normal soap after using and rinsing off the CHG Soap.  Pat yourself dry with a CLEAN TOWEL.  8. Apply the Benzoyl Peroxide only the night before surgery.  Do Not use it the morning of surgery.  Wear CLEAN PAJAMAS to bed the night before surgery  Place CLEAN SHEETS on your bed the night before your surgery  DO NOT SLEEP WITH PETS.   Day of Surgery: Take a shower with CHG soap. Wear Clean/Comfortable clothing the morning of surgery Do not apply any deodorants/lotions.    Remember to brush your teeth WITH YOUR REGULAR TOOTHPASTE.   Please read over the following fact sheets that you were given.                   Do not wear jewelry, make up, or nail polish            Do not wear lotions, powders, perfumes/colognes, or deodorant.            Do not shave 48 hours prior to surgery.  Men may shave face and neck.            Do not bring valuables to the hospital.            [facility] is not responsible for any belongings or valuables.  Do NOT Smoke (Tobacco/Vaping) 24 hours prior to your procedure If you use a CPAP at night, you may bring all equipment for your overnight stay.   Contacts, glasses, dentures or bridgework may not be worn into surgery.      For patients admitted to the hospital, discharge time will be determined by your treatment team.   Patients discharged the day of surgery will not be allowed to drive home, and someone needs to stay with them for 24 hours.    Special instructions:   [place]- Preparing For Surgery  Before surgery, you can play an important role. Because skin is not sterile, your skin needs to be as free of germs as possible. You can reduce the number of germs on your skin by washing with CHG (chlorahexidine gluconate) Soap before surgery.  CHG is an antiseptic cleaner which kills germs and bonds with the skin to continue killing germs even after washing.    Oral Hygiene is also important to reduce your risk of infection.  Remember - BRUSH YOUR TEETH THE MORNING OF SURGERY WITH YOUR REGULAR TOOTHPASTE  Please do not use if you have an allergy to CHG or antibacterial soaps. If your skin becomes reddened/irritated stop using the CHG.  Do not shave (including legs and underarms) for at least 48 hours prior to first CHG shower. It is OK to shave your face.  Please follow these instructions carefully.   Shower the NIGHT BEFORE SURGERY and the MORNING OF SURGERY with DIAL Soap.   Pat yourself dry with a CLEAN  TOWEL.  Wear CLEAN PAJAMAS to bed the night before surgery  Place CLEAN SHEETS on your bed the night of your first shower and DO NOT SLEEP WITH PETS.   Day of Surgery: Please shower morning of surgery  Wear Clean/Comfortable clothing the morning of surgery Do not apply any deodorants/lotions.   Remember to brush your teeth WITH YOUR REGULAR TOOTHPASTE.   Questions were answered. Patient verbalized understanding of instructions.

## 2023-06-08 ENCOUNTER — Other Ambulatory Visit (HOSPITAL_COMMUNITY)

## 2023-06-10 ENCOUNTER — Encounter (HOSPITAL_COMMUNITY): Payer: Self-pay | Admitting: Orthopedic Surgery

## 2023-06-10 ENCOUNTER — Other Ambulatory Visit: Payer: Self-pay

## 2023-06-10 ENCOUNTER — Other Ambulatory Visit (HOSPITAL_COMMUNITY): Payer: Self-pay

## 2023-06-11 ENCOUNTER — Ambulatory Visit (HOSPITAL_COMMUNITY)
Admission: RE | Admit: 2023-06-11 | Discharge: 2023-06-11 | Disposition: A | Discharge status: Home / Self Care | Attending: Orthopedic Surgery | Admitting: Orthopedic Surgery

## 2023-06-11 ENCOUNTER — Encounter (HOSPITAL_COMMUNITY): Payer: Self-pay | Admitting: Orthopedic Surgery

## 2023-06-11 ENCOUNTER — Ambulatory Visit (HOSPITAL_BASED_OUTPATIENT_CLINIC_OR_DEPARTMENT_OTHER): Admitting: Anesthesiology

## 2023-06-11 ENCOUNTER — Ambulatory Visit (HOSPITAL_COMMUNITY): Admitting: Anesthesiology

## 2023-06-11 ENCOUNTER — Other Ambulatory Visit: Payer: Self-pay

## 2023-06-11 ENCOUNTER — Other Ambulatory Visit (HOSPITAL_COMMUNITY): Payer: Self-pay

## 2023-06-11 ENCOUNTER — Encounter (HOSPITAL_COMMUNITY)
Admission: RE | Disposition: A | Discharge status: Home / Self Care | Payer: Self-pay | Source: Home / Self Care | Attending: Orthopedic Surgery

## 2023-06-11 DIAGNOSIS — E119 Type 2 diabetes mellitus without complications: Secondary | ICD-10-CM | POA: Insufficient documentation

## 2023-06-11 DIAGNOSIS — Z7985 Long-term (current) use of injectable non-insulin antidiabetic drugs: Secondary | ICD-10-CM | POA: Diagnosis not present

## 2023-06-11 DIAGNOSIS — M7522 Bicipital tendinitis, left shoulder: Secondary | ICD-10-CM

## 2023-06-11 DIAGNOSIS — I10 Essential (primary) hypertension: Secondary | ICD-10-CM | POA: Diagnosis not present

## 2023-06-11 DIAGNOSIS — Z01818 Encounter for other preprocedural examination: Secondary | ICD-10-CM

## 2023-06-11 DIAGNOSIS — M75102 Unspecified rotator cuff tear or rupture of left shoulder, not specified as traumatic: Secondary | ICD-10-CM | POA: Diagnosis not present

## 2023-06-11 DIAGNOSIS — Z7984 Long term (current) use of oral hypoglycemic drugs: Secondary | ICD-10-CM | POA: Diagnosis not present

## 2023-06-11 DIAGNOSIS — X58XXXA Exposure to other specified factors, initial encounter: Secondary | ICD-10-CM | POA: Diagnosis not present

## 2023-06-11 DIAGNOSIS — S43002D Unspecified subluxation of left shoulder joint, subsequent encounter: Secondary | ICD-10-CM

## 2023-06-11 DIAGNOSIS — M65912 Unspecified synovitis and tenosynovitis, left shoulder: Secondary | ICD-10-CM

## 2023-06-11 DIAGNOSIS — S43002A Unspecified subluxation of left shoulder joint, initial encounter: Secondary | ICD-10-CM | POA: Insufficient documentation

## 2023-06-11 DIAGNOSIS — S43432A Superior glenoid labrum lesion of left shoulder, initial encounter: Secondary | ICD-10-CM

## 2023-06-11 DIAGNOSIS — M75122 Complete rotator cuff tear or rupture of left shoulder, not specified as traumatic: Secondary | ICD-10-CM

## 2023-06-11 DIAGNOSIS — M75101 Unspecified rotator cuff tear or rupture of right shoulder, not specified as traumatic: Secondary | ICD-10-CM | POA: Diagnosis not present

## 2023-06-11 HISTORY — PX: SHOULDER OPEN ROTATOR CUFF REPAIR: SHX2407

## 2023-06-11 HISTORY — PX: POSTERIOR LUMBAR FUSION 2 WITH HARDWARE REMOVAL: SHX7297

## 2023-06-11 LAB — POCT I-STAT, CHEM 8
BUN: 16 mg/dL (ref 8–23)
Calcium, Ion: 1.15 mmol/L (ref 1.15–1.40)
Chloride: 104 mmol/L (ref 98–111)
Creatinine, Ser: 0.7 mg/dL (ref 0.61–1.24)
Glucose, Bld: 129 mg/dL — ABNORMAL HIGH (ref 70–99)
HCT: 41 % (ref 39.0–52.0)
Hemoglobin: 13.9 g/dL (ref 13.0–17.0)
Potassium: 4.1 mmol/L (ref 3.5–5.1)
Sodium: 140 mmol/L (ref 135–145)
TCO2: 25 mmol/L (ref 22–32)

## 2023-06-11 LAB — GLUCOSE, CAPILLARY
Glucose-Capillary: 140 mg/dL — ABNORMAL HIGH (ref 70–99)
Glucose-Capillary: 118 mg/dL — ABNORMAL HIGH (ref 70–99)
Glucose-Capillary: 163 mg/dL — ABNORMAL HIGH (ref 70–99)

## 2023-06-11 SURGERY — ARTHROSCOPY, SHOULDER WITH DEBRIDEMENT
Anesthesia: General | Site: Shoulder | Laterality: Left

## 2023-06-11 MED ORDER — FENTANYL CITRATE (PF) 250 MCG/5ML IJ SOLN
INTRAMUSCULAR | Status: DC | PRN
Start: 2023-06-11 — End: 2023-06-11
  Administered 2023-06-11: 25 ug via INTRAVENOUS
  Administered 2023-06-11: 150 ug via INTRAVENOUS
  Administered 2023-06-11: 25 ug via INTRAVENOUS

## 2023-06-11 MED ORDER — OXYCODONE HCL 5 MG PO TABS: 5.0000 mg | ORAL_TABLET | Freq: Once | ORAL | Status: DC | PRN

## 2023-06-11 MED ORDER — PHENYLEPHRINE HCL-NACL 20-0.9 MG/250ML-% IV SOLN
INTRAVENOUS | Status: DC | PRN
  Administered 2023-06-11: 15 ug/min via INTRAVENOUS

## 2023-06-11 MED ORDER — PROPOFOL 10 MG/ML IV BOLUS
INTRAVENOUS | Status: AC
  Filled 2023-06-11: qty 20

## 2023-06-11 MED ORDER — CEFAZOLIN SODIUM-DEXTROSE 2-4 GM/100ML-% IV SOLN
2.0000 g | INTRAVENOUS | Status: AC
  Administered 2023-06-11: 2 g via INTRAVENOUS
  Filled 2023-06-11: qty 100

## 2023-06-11 MED ORDER — SUGAMMADEX SODIUM 200 MG/2ML IV SOLN
INTRAVENOUS | Status: DC | PRN
  Administered 2023-06-11: 180 mg via INTRAVENOUS

## 2023-06-11 MED ORDER — BUPIVACAINE LIPOSOME 1.3 % IJ SUSP
INTRAMUSCULAR | Status: AC
  Filled 2023-06-11: qty 10

## 2023-06-11 MED ORDER — ONDANSETRON HCL 4 MG/2ML IJ SOLN
INTRAMUSCULAR | Status: AC
  Filled 2023-06-11: qty 2

## 2023-06-11 MED ORDER — CLONIDINE HCL (ANALGESIA) 100 MCG/ML EP SOLN
EPIDURAL | Status: DC | PRN
  Administered 2023-06-11: 100 ug via INTRA_ARTICULAR

## 2023-06-11 MED ORDER — SUCCINYLCHOLINE CHLORIDE 200 MG/10ML IV SOSY: PREFILLED_SYRINGE | INTRAVENOUS | Status: DC | PRN

## 2023-06-11 MED ORDER — OXYCODONE HCL 5 MG PO TABS
5.0000 mg | ORAL_TABLET | ORAL | 0 refills | Status: DC | PRN
  Filled 2023-06-11: qty 30, 5d supply, fill #0

## 2023-06-11 MED ORDER — CLONIDINE HCL (ANALGESIA) 100 MCG/ML EP SOLN
EPIDURAL | Status: AC
  Filled 2023-06-11: qty 10

## 2023-06-11 MED ORDER — TRANEXAMIC ACID-NACL 1000-0.7 MG/100ML-% IV SOLN
1000.0000 mg | INTRAVENOUS | Status: AC
  Administered 2023-06-11: 1000 mg via INTRAVENOUS
  Filled 2023-06-11: qty 100

## 2023-06-11 MED ORDER — HYDROMORPHONE HCL 1 MG/ML IJ SOLN
INTRAMUSCULAR | Status: DC | PRN
  Administered 2023-06-11: .25 mg via INTRAVENOUS

## 2023-06-11 MED ORDER — LACTATED RINGERS IV SOLN: INTRAVENOUS | Status: DC

## 2023-06-11 MED ORDER — DEXAMETHASONE SODIUM PHOSPHATE 10 MG/ML IJ SOLN
INTRAMUSCULAR | Status: AC
  Filled 2023-06-11: qty 1

## 2023-06-11 MED ORDER — ROCURONIUM BROMIDE 10 MG/ML (PF) SYRINGE
PREFILLED_SYRINGE | INTRAVENOUS | Status: AC
Start: 2023-06-11 — End: ?
  Filled 2023-06-11: qty 10

## 2023-06-11 MED ORDER — LIDOCAINE 2% (20 MG/ML) 5 ML SYRINGE
INTRAMUSCULAR | Status: AC
  Filled 2023-06-11: qty 5

## 2023-06-11 MED ORDER — MORPHINE SULFATE (PF) 4 MG/ML IV SOLN
INTRAVENOUS | Status: AC
  Filled 2023-06-11: qty 2

## 2023-06-11 MED ORDER — BUPIVACAINE HCL (PF) 0.25 % IJ SOLN
INTRAMUSCULAR | Status: AC
  Filled 2023-06-11: qty 30

## 2023-06-11 MED ORDER — SODIUM CHLORIDE 0.9 % IR SOLN
Status: DC | PRN
  Administered 2023-06-11: 1000 mL

## 2023-06-11 MED ORDER — FENTANYL CITRATE (PF) 100 MCG/2ML IJ SOLN
INTRAMUSCULAR | Status: AC
  Filled 2023-06-11: qty 2

## 2023-06-11 MED ORDER — BUPIVACAINE HCL (PF) 0.25 % IJ SOLN
INTRAMUSCULAR | Status: DC | PRN
  Administered 2023-06-11: 10 mL
  Administered 2023-06-11: 20 mL

## 2023-06-11 MED ORDER — METHOCARBAMOL 500 MG PO TABS
500.0000 mg | ORAL_TABLET | Freq: Three times a day (TID) | ORAL | 1 refills | Status: DC | PRN
  Filled 2023-06-11: qty 30, 10d supply, fill #0

## 2023-06-11 MED ORDER — ONDANSETRON HCL 4 MG/2ML IJ SOLN
INTRAMUSCULAR | Status: DC | PRN
Start: 2023-06-11 — End: 2023-06-11
  Administered 2023-06-11: 4 mg via INTRAVENOUS

## 2023-06-11 MED ORDER — DROPERIDOL 2.5 MG/ML IJ SOLN: 0.6250 mg | Freq: Once | INTRAMUSCULAR | Status: DC | PRN

## 2023-06-11 MED ORDER — BUPIVACAINE LIPOSOME 1.3 % IJ SUSP
INTRAMUSCULAR | Status: AC
  Filled 2023-06-11: qty 20

## 2023-06-11 MED ORDER — DEXAMETHASONE SODIUM PHOSPHATE 10 MG/ML IJ SOLN
INTRAMUSCULAR | Status: DC | PRN
  Administered 2023-06-11: 5 mg via INTRAVENOUS

## 2023-06-11 MED ORDER — PROPOFOL 10 MG/ML IV BOLUS
INTRAVENOUS | Status: DC | PRN
  Administered 2023-06-11: 30 mg via INTRAVENOUS
  Administered 2023-06-11: 150 mg via INTRAVENOUS
  Administered 2023-06-11: 40 mg via INTRAVENOUS

## 2023-06-11 MED ORDER — 0.9 % SODIUM CHLORIDE (POUR BTL) OPTIME
TOPICAL | Status: DC | PRN
  Administered 2023-06-11: 1000 mL

## 2023-06-11 MED ORDER — LIDOCAINE 2% (20 MG/ML) 5 ML SYRINGE
INTRAMUSCULAR | Status: DC | PRN
  Administered 2023-06-11: 80 mg via INTRAVENOUS

## 2023-06-11 MED ORDER — KETOROLAC TROMETHAMINE 30 MG/ML IJ SOLN
INTRAMUSCULAR | Status: DC | PRN
  Administered 2023-06-11: 15 mg via INTRAVENOUS

## 2023-06-11 MED ORDER — PHENYLEPHRINE 80 MCG/ML (10ML) SYRINGE FOR IV PUSH (FOR BLOOD PRESSURE SUPPORT)
PREFILLED_SYRINGE | INTRAVENOUS | Status: DC | PRN
  Administered 2023-06-11: 80 ug via INTRAVENOUS
  Administered 2023-06-11: 160 ug via INTRAVENOUS
  Administered 2023-06-11: 80 ug via INTRAVENOUS

## 2023-06-11 MED ORDER — HYDROMORPHONE HCL 1 MG/ML IJ SOLN
INTRAMUSCULAR | Status: AC
  Filled 2023-06-11: qty 0.5

## 2023-06-11 MED ORDER — PHENYLEPHRINE 80 MCG/ML (10ML) SYRINGE FOR IV PUSH (FOR BLOOD PRESSURE SUPPORT)
PREFILLED_SYRINGE | INTRAVENOUS | Status: AC
  Filled 2023-06-11: qty 10

## 2023-06-11 MED ORDER — ROCURONIUM BROMIDE 10 MG/ML (PF) SYRINGE
PREFILLED_SYRINGE | INTRAVENOUS | Status: DC | PRN
  Administered 2023-06-11: 80 mg via INTRAVENOUS

## 2023-06-11 MED ORDER — OXYCODONE HCL 5 MG/5ML PO SOLN: 5.0000 mg | Freq: Once | ORAL | Status: DC | PRN

## 2023-06-11 MED ORDER — FENTANYL CITRATE (PF) 250 MCG/5ML IJ SOLN
INTRAMUSCULAR | Status: AC
  Filled 2023-06-11: qty 5

## 2023-06-11 MED ORDER — MORPHINE SULFATE (PF) 4 MG/ML IV SOLN
INTRAVENOUS | Status: DC | PRN
  Administered 2023-06-11: 8 mg via INTRAMUSCULAR

## 2023-06-11 MED ORDER — BUPIVACAINE LIPOSOME 1.3 % IJ SUSP
INTRAMUSCULAR | Status: DC | PRN
  Administered 2023-06-11: 20 mL

## 2023-06-11 MED ORDER — HYDROMORPHONE HCL 1 MG/ML IJ SOLN: 0.2500 mg | INTRAMUSCULAR | Status: DC | PRN

## 2023-06-11 MED ORDER — ORAL CARE MOUTH RINSE: 15.0000 mL | Freq: Once | OROMUCOSAL | Status: AC

## 2023-06-11 MED ORDER — KETOROLAC TROMETHAMINE 30 MG/ML IJ SOLN
INTRAMUSCULAR | Status: AC
  Filled 2023-06-11: qty 1

## 2023-06-11 MED ORDER — MIDAZOLAM HCL 2 MG/2ML IJ SOLN
INTRAMUSCULAR | Status: AC
  Filled 2023-06-11: qty 2

## 2023-06-11 MED ORDER — INSULIN ASPART 100 UNIT/ML IJ SOLN: 0.0000 [IU] | INTRAMUSCULAR | Status: DC | PRN

## 2023-06-11 MED ORDER — CHLORHEXIDINE GLUCONATE 0.12 % MT SOLN
15.0000 mL | Freq: Once | OROMUCOSAL | Status: AC
  Administered 2023-06-11: 15 mL via OROMUCOSAL
  Filled 2023-06-11: qty 15

## 2023-06-11 MED ORDER — CELECOXIB 100 MG PO CAPS
100.0000 mg | ORAL_CAPSULE | Freq: Two times a day (BID) | ORAL | 0 refills | Status: DC
  Filled 2023-06-11: qty 60, 30d supply, fill #0

## 2023-06-11 SURGICAL SUPPLY — 79 items
ALCOHOL 70% 16 OZ (MISCELLANEOUS) ×2 IMPLANT
ANCHOR FBRTK 2.6 SUTURETAP 1.3 (Anchor) IMPLANT
ANCHOR SUT 1.8 FIBERTAK SB KL (Anchor) IMPLANT
ANCHOR SWIVELOCK BIO 4.75X19.1 (Anchor) IMPLANT
BAG COUNTER SPONGE SURGICOUNT (BAG) ×2 IMPLANT
BENZOIN TINCTURE PRP APPL 2/3 (GAUZE/BANDAGES/DRESSINGS) ×2 IMPLANT
BLADE EXCALIBUR 4.0X13 (MISCELLANEOUS) ×2 IMPLANT
BLADE SURG 11 STRL SS (BLADE) ×2 IMPLANT
BLADE SURG 15 STRL LF DISP TIS (BLADE) IMPLANT
COVER SURGICAL LIGHT HANDLE (MISCELLANEOUS) ×2 IMPLANT
DRAPE IMP U-DRAPE 54X76 (DRAPES) ×2 IMPLANT
DRAPE INCISE IOBAN 66X45 STRL (DRAPES) ×2 IMPLANT
DRAPE STERI 35X30 U-POUCH (DRAPES) ×2 IMPLANT
DRAPE SURG ORHT 6 SPLT 77X108 (DRAPES) ×2 IMPLANT
DRAPE U-SHAPE 47X51 STRL (DRAPES) ×4 IMPLANT
DRSG AQUACEL AG ADV 3.5X 4 (GAUZE/BANDAGES/DRESSINGS) ×2 IMPLANT
DRSG TEGADERM 4X4.75 (GAUZE/BANDAGES/DRESSINGS) IMPLANT
DRSG XEROFORM 1X8 (GAUZE/BANDAGES/DRESSINGS) IMPLANT
DURAPREP 26ML APPLICATOR (WOUND CARE) ×2 IMPLANT
DW OUTFLOW CASSETTE/TUBE SET (MISCELLANEOUS) ×2 IMPLANT
ELECT CAUTERY BLADE 6.4 (BLADE) ×2 IMPLANT
ELECTRODE REM PT RTRN 9FT ADLT (ELECTROSURGICAL) ×2 IMPLANT
GAUZE PAD ABD 8X10 STRL (GAUZE/BANDAGES/DRESSINGS) ×2 IMPLANT
GAUZE SPONGE 4X4 12PLY STRL (GAUZE/BANDAGES/DRESSINGS) ×2 IMPLANT
GAUZE XEROFORM 1X8 LF (GAUZE/BANDAGES/DRESSINGS) ×2 IMPLANT
GLOVE BIOGEL M 6.5 STRL (GLOVE) ×2 IMPLANT
GLOVE BIOGEL PI IND STRL 6.5 (GLOVE) ×2 IMPLANT
GLOVE BIOGEL PI IND STRL 8 (GLOVE) ×2 IMPLANT
GLOVE ECLIPSE 8.0 STRL XLNG CF (GLOVE) ×2 IMPLANT
GOWN STRL REUS W/ TWL LRG LVL3 (GOWN DISPOSABLE) ×6 IMPLANT
KIT BASIN OR (CUSTOM PROCEDURE TRAY) ×2 IMPLANT
KIT STR SPEAR 1.8 FBRTK DISP (KITS) IMPLANT
KIT TURNOVER KIT B (KITS) ×2 IMPLANT
MANIFOLD NEPTUNE II (INSTRUMENTS) ×2 IMPLANT
NDL 1/2 CIR CATGUT .05X1.09 (NEEDLE) IMPLANT
NDL HD SCORPION MEGA LOADER (NEEDLE) IMPLANT
NDL HYPO 22X1.5 SAFETY MO (MISCELLANEOUS) IMPLANT
NDL HYPO 25GX1X1/2 BEV (NEEDLE) ×2 IMPLANT
NDL HYPO 25X1 1.5 SAFETY (NEEDLE) ×2 IMPLANT
NDL SPNL 18GX3.5 QUINCKE PK (NEEDLE) ×2 IMPLANT
NDL TAPERED W/ NITINOL LOOP (MISCELLANEOUS) IMPLANT
NEEDLE 1/2 CIR CATGUT .05X1.09 (NEEDLE) IMPLANT
NEEDLE HYPO 22X1.5 SAFETY MO (MISCELLANEOUS) IMPLANT
NEEDLE HYPO 25GX1X1/2 BEV (NEEDLE) ×1 IMPLANT
NEEDLE HYPO 25X1 1.5 SAFETY (NEEDLE) ×1 IMPLANT
NEEDLE SPNL 18GX3.5 QUINCKE PK (NEEDLE) ×1 IMPLANT
NEEDLE TAPERED W/ NITINOL LOOP (MISCELLANEOUS) IMPLANT
NS IRRIG 1000ML POUR BTL (IV SOLUTION) ×2 IMPLANT
PACK SHOULDER (CUSTOM PROCEDURE TRAY) ×2 IMPLANT
PACK UNIVERSAL I (CUSTOM PROCEDURE TRAY) ×2 IMPLANT
PAD ARMBOARD POSITIONER FOAM (MISCELLANEOUS) ×4 IMPLANT
PASSER SUT SWANSON 36MM LOOP (INSTRUMENTS) IMPLANT
PORT APPOLLO RF 90DEGREE MULTI (SURGICAL WAND) ×2 IMPLANT
RESTRAINT HEAD UNIVERSAL NS (MISCELLANEOUS) ×2 IMPLANT
SLING ARM FOAM STRAP LRG (SOFTGOODS) IMPLANT
SLING ARM IMMOBILIZER XL (CAST SUPPLIES) IMPLANT
SPIKE FLUID TRANSFER (MISCELLANEOUS) IMPLANT
SPONGE T-LAP 4X18 ~~LOC~~+RFID (SPONGE) ×4 IMPLANT
STAPLER VISISTAT 35W (STAPLE) ×2 IMPLANT
STRIP CLOSURE SKIN 1/2X4 (GAUZE/BANDAGES/DRESSINGS) IMPLANT
SUCTION TUBE FRAZIER 10FR DISP (SUCTIONS) ×2 IMPLANT
SUT ETHILON 2 0 FS 18 (SUTURE) IMPLANT
SUT ETHILON 3 0 PS 1 (SUTURE) ×2 IMPLANT
SUT MNCRL AB 3-0 PS2 27 (SUTURE) IMPLANT
SUT VIC AB 0 CT1 27XBRD ANBCTR (SUTURE) ×2 IMPLANT
SUT VIC AB 1 CT1 27XBRD ANBCTR (SUTURE) IMPLANT
SUT VIC AB 1 CTX 27 (SUTURE) ×2 IMPLANT
SUT VIC AB 2-0 CT2 27 (SUTURE) ×2 IMPLANT
SUT VICRYL 0 UR6 27IN ABS (SUTURE) IMPLANT
SUTURE 0 FIBERLP 38 BLU TPR ND (SUTURE) IMPLANT
SUTURE FIBERWR #2 38 T-5 BLUE (SUTURE) ×6 IMPLANT
SYR CONTROL 10ML LL (SYRINGE) ×2 IMPLANT
SYSTEM FBRTK BUTTON 2.6 (Anchor) IMPLANT
TOWEL GREEN STERILE (TOWEL DISPOSABLE) ×2 IMPLANT
TOWEL GREEN STERILE FF (TOWEL DISPOSABLE) ×2 IMPLANT
TRAY FOLEY MTR SLVR 16FR STAT (SET/KITS/TRAYS/PACK) IMPLANT
TUBING ARTHROSCOPY IRRIG 16FT (MISCELLANEOUS) ×2 IMPLANT
WAND ABLATOR APOLLO I90 (BUR) IMPLANT
WATER STERILE IRR 1000ML POUR (IV SOLUTION) ×2 IMPLANT

## 2023-06-11 NOTE — Progress Notes (Signed)
 Last dose of Monjaro on 04/20. Dr. Yvonnie Heritage is aware.

## 2023-06-11 NOTE — Anesthesia Procedure Notes (Signed)
 Procedure Name: Intubation Date/Time: 06/11/2023 11:50 AM  Performed by: Cortne Amara J, CRNAPre-anesthesia Checklist: Patient identified, Emergency Drugs available, Suction available and Patient being monitored Patient Re-evaluated:Patient Re-evaluated prior to induction Oxygen Delivery Method: Circle System Utilized Preoxygenation: Pre-oxygenation with 100% oxygen Induction Type: IV induction Ventilation: Mask ventilation without difficulty and Oral airway inserted - appropriate to patient size Grade View: Grade I Tube type: Oral Tube size: 8.0 mm Number of attempts: 1 Airway Equipment and Method: Stylet and Oral airway Placement Confirmation: ETT inserted through vocal cords under direct vision, positive ETCO2 and breath sounds checked- equal and bilateral Secured at: 23 cm Tube secured with: Tape Dental Injury: Teeth and Oropharynx as per pre-operative assessment

## 2023-06-11 NOTE — Transfer of Care (Signed)
 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) Performed: ARTHROSCOPY, SHOULDER WITH DEBRIDEMENT (Left: Shoulder) REPAIR, ROTATOR CUFF, OPEN (Left: Shoulder)  Patient Location: PACU  Anesthesia Type:General  Level of Consciousness: awake, alert , and oriented  Airway & Oxygen Therapy: Patient Spontanous Breathing and Patient connected to face mask oxygen  Post-op Assessment: Report given to RN and Post -op Vital signs reviewed and stable  Post vital signs: Reviewed and stable  Last Vitals:  Vitals Value Taken Time  BP 134/76 06/11/23 1515  Temp 36.4 C 06/11/23 1511  Pulse 88 06/11/23 1516  Resp 17 06/11/23 1516  SpO2 92 % 06/11/23 1516  Vitals shown include unfiled device data.  Last Pain:  Vitals:   06/11/23 0929  TempSrc:   PainSc: 0-No pain         Complications: No notable events documented.

## 2023-06-11 NOTE — H&P (Signed)
 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  He is left-hand dominant but plays golf right-handed.  Injections have helped his shoulders in the past especially on the right-hand side.  He does have a history of diabetes which is well-controlled.   MRI scan of that left shoulder is reviewed.  Does show tearing of the upper border of the subscap with a subluxation of the biceps tendon.  The biceps tendon is on the medial aspect of the lesser tuberosity.  No significant glenohumeral joint arthritis visible on the MRI scan.  There is rotator cuff tearing of the supraspinatus at the anterior and posterior aspect of the footprint.  Both these tears are retracted to the mid portion of the humeral head apex.  There is a band of supraspinatus which remains attached.  No significant muscle atrophy in the subscap.  Mild atrophy of the supraspinatus.  Past Medical History:  Diagnosis Date   Adrenal tumor    rigth   Arthritis    Cancer (HCC)    basal cell   Diabetes mellitus without complication (HCC)    type 2; tx metformin  and wkly ozempic  on Sundays   Hair loss    History of kidney stones    surgery to remove   Hyperlipidemia    tx w/atorvastatin    Hypertension    tx w/lisinopril    Insomnia    tx w/ambien     Past Surgical History:  Procedure Laterality Date   APPENDECTOMY     COLONOSCOPY     I & D KNEE WITH POLY EXCHANGE Right 01/24/2022   Procedure: IRRIGATION AND DEBRIDEMENT RIGHT KNEE WITH POLY EXCHANGE;  Surgeon: Timothy Ford, MD;  Location: [facility] OR;  Service: Orthopedics;  Laterality: Right;   IR URETERAL STENT LEFT NEW ACCESS W/O SEP NEPHROSTOMY CATH  04/13/2023   LITHOTRIPSY     MINOR CARPAL TUNNEL Left 11/07/2022   Procedure: MINOR CARPAL TUNNEL;  Surgeon: Merrill Abide, MD;  Location: [facility];  Service: Orthopedics;  Laterality: Left;  NEEDS RNFA (OPEN)   NEPHROLITHOTOMY Left 04/13/2023   Procedure: LEFT NEPHROLITHOTOMY PERCUTANEOUS;  Surgeon: Samson Croak, MD;  Location: [facility];  Service: Urology;  Laterality: Left;  150 MINUTE CASE   REPLACEMENT TOTAL KNEE Right    TOTAL KNEE REVISION Right 04/22/2017   Procedure: REVISION RIGHT TOTAL KNEE ARTHROPLASTY VS. POLY EXCHANGE;  Surgeon: Timothy Ford, MD;  Location: [facility] OR;  Service: Orthopedics;  Laterality: Right;    Family History  Problem Relation Age of Onset   Cancer Mother    Diabetes Mother    Mental illness Mother    Heart disease Father    Arthritis Brother    Social History:  reports that he has never smoked. He has never been exposed to tobacco smoke. He has never used smokeless tobacco. He reports current alcohol use of about 1.0 standard drink of alcohol per week. He reports that he does not use drugs.  Allergies:  Allergies  Allergen Reactions   Sulfa  Antibiotics Other (See Comments)    Fixed drug reaction    Medications Prior to Admission  Medication Sig Dispense Refill   atorvastatin  (LIPITOR) 10 MG tablet Take 1 tablet (10 mg total) by mouth 3 (three) times a week. 90 tablet 1  finasteride  (PROSCAR ) 5 MG tablet Take 1 tablet (5 mg total) by mouth daily. (Patient taking differently: Take 1 mg by mouth daily.) 90 tablet 1   lisinopril  (ZESTRIL ) 10 MG tablet Take 1 tablet by mouth daily. 90 tablet 1   metFORMIN  (GLUCOPHAGE -XR) 500 MG 24 hr tablet Take 1 tablet (500 mg total) by mouth daily with breakfast. 90 tablet 0   tadalafil  (CIALIS ) 20 MG tablet Take 1 tablet (20 mg total) by mouth daily as needed for erectile dysfunction. 30 tablet 1   tirzepatide  (MOUNJARO ) 15 MG/0.5ML Pen Inject 15 mg into the skin once a week. 6 mL 0   zolpidem  (AMBIEN  CR) 6.25 MG CR tablet Take 1 tablet (6.25 mg total) by mouth at bedtime as needed for sleep. 90 tablet 0    Results for orders placed or  performed during the hospital encounter of 06/11/23 (from the past 48 hours)  Glucose, capillary     Status: Abnormal   Collection Time: 06/11/23  9:18 AM  Result Value Ref Range   Glucose-Capillary 140 (H) 70 - 99 mg/dL    Comment: Glucose reference range applies only to samples taken after fasting for at least 8 hours.  I-STAT, chem 8     Status: Abnormal   Collection Time: 06/11/23 10:03 AM  Result Value Ref Range   Sodium 140 135 - 145 mmol/L   Potassium 4.1 3.5 - 5.1 mmol/L   Chloride 104 98 - 111 mmol/L   BUN 16 8 - 23 mg/dL   Creatinine, Ser 4.09 0.61 - 1.24 mg/dL   Glucose, Bld 811 (H) 70 - 99 mg/dL    Comment: Glucose reference range applies only to samples taken after fasting for at least 8 hours.   Calcium , Ion 1.15 1.15 - 1.40 mmol/L   TCO2 25 22 - 32 mmol/L   Hemoglobin 13.9 13.0 - 17.0 g/dL   HCT 91.4 78.2 - 95.6 %   No results found.  Review of Systems  Musculoskeletal:  Positive for arthralgias.  All other systems reviewed and are negative.   Blood pressure 135/75, pulse 76, temperature 98.3 F (36.8 C), temperature source Oral, resp. rate 18, height 6' (1.829 m), weight 86.2 kg, SpO2 97%. Physical Exam Vitals reviewed.  HENT:     Head: Normocephalic.     Nose: Nose normal.     Mouth/Throat:     Mouth: Mucous membranes are moist.  Eyes:     Pupils: Pupils are equal, round, and reactive to light.  Cardiovascular:     Rate and Rhythm: Normal rate.     Pulses: Normal pulses.  Pulmonary:     Effort: Pulmonary effort is normal.  Abdominal:     General: Abdomen is flat.  Musculoskeletal:        General: Normal range of motion.     Cervical back: Normal range of motion.  Skin:    General: Skin is warm.     Capillary Refill: Capillary refill takes less than 2 seconds.  Neurological:     General: No focal deficit present.     Mental Status: He is alert.  Psychiatric:        Mood and Affect: Mood normal.    Ortho exam demonstrates minimal crepitus with  passive range of motion of that left shoulder. Passive range of motion is 50/95/160. Pretty good rotator cuff strength infraspinatus supraspinatus and subscap muscle testing. Slightly weaker and more painful with empty can testing on the left compared to the right. Not too  much in terms of AC joint tenderness is present.   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  These tears are retracted to the apex of the humeral head which puts them in a position where it starts to get much more difficult to mobilize and repair.  The tears do not appear traumatic but more degenerative in nature.  Biceps tendon also is likely giving Arther most of his symptoms.  Plan at this time is shoulder arthroscopy with biceps tendon release tenodesis and mini open rotator cuff tear repair likely augmented with patch based on the degenerative nature of the tears and the retracted nature of the tears.  Goal would be to avoid the need for reverse shoulder replacement down the road.  We may also need to put a few anchors in the anterior superior aspect of the subscapularis.  Risk and benefits are discussed include not limited to infection shoulder stiffness incomplete pain relief as well as incomplete return of function.  He did do very well with his knee replacements in the past.  Plan to use shoulder CPM for at least 2 weeks after surgery to prevent shoulder stiffness.  All questions answered   Marykay Snipes, MD 06/11/2023, 10:33 AM

## 2023-06-11 NOTE — Anesthesia Preprocedure Evaluation (Addendum)
 Anesthesia Evaluation  Patient identified by MRN, date of birth, ID band Patient awake    Reviewed: Allergy & Precautions, NPO status , Patient's Chart, lab work & pertinent test results  Airway Mallampati: II       Dental no notable dental hx. (+) Teeth Intact, Dental Advisory Given   Pulmonary neg pulmonary ROS   Pulmonary exam normal breath sounds clear to auscultation       Cardiovascular hypertension, Pt. on medications Normal cardiovascular exam Rhythm:Regular Rate:Normal     Neuro/Psych  PSYCHIATRIC DISORDERS      Weakness of left thumb and index finger which began 2 mos. After Left carpal tunnel release. Weakness is primarily with flexion of DIP joint of thumb and index finger.  Neuromuscular disease    GI/Hepatic negative GI ROS, Neg liver ROS,,,  Endo/Other  diabetes, Well Controlled, Type 2  GLP-1 RA therapy  Renal/GU Renal diseaseHx/o left adrenal tumor Hx/o renal calculi  negative genitourinary   Musculoskeletal  (+) Arthritis , Osteoarthritis,  Left shoulder rotator cuff tear   Abdominal   Peds  Hematology negative hematology ROS (+)   Anesthesia Other Findings   Reproductive/Obstetrics                             Anesthesia Physical Anesthesia Plan  ASA: 2  Anesthesia Plan: General   Post-op Pain Management: Minimal or no pain anticipated   Induction: Intravenous  PONV Risk Score and Plan: 3 and Treatment may vary due to age or medical condition, Ondansetron  and Dexamethasone   Airway Management Planned: Oral ETT  Additional Equipment: None  Intra-op Plan:   Post-operative Plan: Extubation in OR  Informed Consent: I have reviewed the patients History and Physical, chart, labs and discussed the procedure including the risks, benefits and alternatives for the proposed anesthesia with the patient or authorized representative who has indicated his/her understanding  and acceptance.     Dental advisory given  Plan Discussed with: CRNA and Anesthesiologist  Anesthesia Plan Comments:         Anesthesia Quick Evaluation

## 2023-06-11 NOTE — Anesthesia Postprocedure Evaluation (Signed)
 Anesthesia Post Note  Patient: Watson Hacking, Dr.  Inocente Manifold) Performed: ARTHROSCOPY, SHOULDER WITH DEBRIDEMENT (Left: Shoulder) REPAIR, ROTATOR CUFF, OPEN (Left: Shoulder)     Patient location during evaluation: PACU Anesthesia Type: General Level of consciousness: awake and alert and oriented Pain management: pain level controlled Vital Signs Assessment: post-procedure vital signs reviewed and stable Respiratory status: spontaneous breathing, nonlabored ventilation and respiratory function stable Cardiovascular status: blood pressure returned to baseline and stable Postop Assessment: no apparent nausea or vomiting Anesthetic complications: no   No notable events documented.  Last Vitals:  Vitals:   06/11/23 1545 06/11/23 1600  BP: (!) 156/86 (!) 159/91  Pulse: 89 94  Resp: 13 12  Temp: (!) 36.4 C   SpO2: 94% 95%    Last Pain:  Vitals:   06/11/23 1545  TempSrc:   PainSc: 0-No pain                 Delmos Velaquez A.

## 2023-06-11 NOTE — Brief Op Note (Signed)
   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  PRE-OPERATIVE DIAGNOSIS:  left shoulder rotator cuff tear, biceps subluxation,  POST-OPERATIVE DIAGNOSIS:  left shoulder rotator cuff tear, biceps subluxation, significant synovitis  PROCEDURE:  Procedure(s):  Right ARTHROSCOPY, SHOULDER WITH  extensive debridement, DEBRIDEMENT, biceps tenodesis,  REPAIR, ROTATOR CUFF, OPEN  SURGEON:  Surgeon(s): Rozelle Corning, Maricela Shoe, MD  ASSISTANT: magnant pa  ANESTHESIA:   general  EBL: 25 ml    Total I/O In: -  Out: 50 [Blood:50]  BLOOD ADMINISTERED: none  DRAINS: none   LOCAL MEDICATIONS USED:  marcaine  morphine  clonidine  exparel   SPECIMEN:  No Specimen  COUNTS:  YES  TOURNIQUET:  * No tourniquets in log *  DICTATION: .Other Dictation: Dictation Number [id number]  PLAN OF CARE: Discharge to home after PACU  PATIENT DISPOSITION:  PACU - hemodynamically stable

## 2023-06-11 NOTE — Op Note (Signed)
 NAME: Adam Ware, Adam Ware MEDICAL RECORD NO: [id number] ACCOUNT NO: [id number] DATE OF BIRTH: 18-Nov-1950 FACILITY: [facility] LOCATION: [facility] PHYSICIAN: Gloriann Larger. Rozelle Corning, MD  Operative Report   DATE OF PROCEDURE: 06/11/2023  PREOPERATIVE DIAGNOSIS:  Left shoulder biceps subluxation with rotator cuff tear of both the supraspinatus and a separate tear of the infraspinatus.  POSTOPERATIVE DIAGNOSIS:  Left shoulder significant biceps tendinitis and subluxation with significant synovitis within the capsule and shoulder joint itself, degenerative SLAP tear and rotator cuff tear involving the supraspinatus as well as the  junction of the infraspinatus and supraspinatus.  Anterior tear measured about 2 x 2 cm.  The posterior tear also measured 2 x 2 cm.  DESCRIPTION OF PROCEDURE:  The patient was brought to the operating room where a general anesthetic was induced.  Preoperative antibiotics were administered.  Timeout was called.  Head was in neutral position.  The left shoulder, arm and hand were  prescrubbed with alcohol and Betadine allowed to air dry, prepped with DuraPrep solution and draped in a sterile manner.  Ioban was used to seal the operative field and cover the axilla.  Preoperative range of motion was about 70/100/170.  After calling  timeout, posterior portal was created 2 cm medial and inferior to the posterolateral margin of the acromion.  Diagnostic arthroscopy was performed.  Anterior portal was created under direct visualization.  There was significant red, hot synovitis within  the rotator interval as well as within the superior capsule.  This was coagulated using the ArthroCare wand.  Significant biceps tendinitis and fraying was present along with significant degenerative SLAP tear.  Using the ArthroCare wand, the biceps  tendon was released.  The superior labrum was debrided.  Articular surfaces were intact including the glenoid and humeral head.  Anterior inferior, and posterior inferior  glenohumeral ligaments were intact.  The patient also had significant rotator cuff  tearing, which was debrided using a Torpedo shaver.  Following debridement of the synovitis using the ArthroCare wand and the rotator cuff tear using the shaver as well as the superior labrum using the ArthroCare wand, the instruments were removed from  the anterior and posterior portals.  Those portals were closed using 3-0 nylon suture.  Ioban was then used to cover the entire operative field.  An incision was made off the anterolateral margin of the acromion.  Skin and subcutaneous tissue were  sharply divided.  The raphe between the anterior and middle deltoids was identified and a stay suture was placed 4 cm at the inferior aspect of the raphe 4 cm from the anterolateral margin of the acromion.  The deltoid was then split in the raphe.   Bursectomy was performed.  Significant bursitis was present.  This was removed.  Acromioplasty was performed.  Next, the self-retaining retractor was placed and the bicipital transverse humeral ligament was opened.  The biceps tendon was significantly  hypertrophied.  We removed about half of its volume in order to get back to a more normal size.  This biceps tendon was then tagged with a FiberLoop suture.  Next, using two suture anchors, one proximal and one distal, the biceps tendon was tenodesed  into the bicipital groove using these Arthrex knotless suture anchors.  We also attached the upper portion subscap tear to that tenodesed biceps tendon, which was very securely fixed in the bicipital groove.  Oversewn with 3-0 Vicryl sutures.  Next,  attention was directed towards the rotator cuff tears.  The rotator cuff tears were unusual in  that they were more along the lines of dissolution of the tissue as opposed to true rotator cuff tear avulsion off the greater tuberosity.  Because of this, we  were able to use 0 FiberWire sutures to close up the posterior rotator cuff tear x2 sutures.   Tied on the articular surface.  Next, we placed one Arthrex medial row anchor at the junction of the greater tuberosity and articular surface.  The two  SutureTapes were then placed x4 ends, two on each flap of the rotator cuff tear in order to close it in a watertight fashion.  The FiberWire sutures were then brought out at the junction of the rotator cuff and the attachment onto the lateral tuberosity  and these were later incorporated into SwiveLocks.  Attention was then directed towards the anterior tear.  In a similar fashion, margin convergence was performed with 3-0 FiberWire sutures.  We used one medial row anchor at the articular surface  tuberosity margin and placed those four SutureTape ends on the anterior and posterior flap of the tendon.  The FiberWire sutures were tied and then the SutureTapes were tied and a watertight repair was achieved.  There was fairly significant spacing  between the two rotator cuff tears and we did achieve a watertight repair on both of those.  Patch was not indicated primarily due to the horizontal distance between the two tears.  At this time, the combination of SutureTapes and 0 FiberWire sutures  were placed into three SwiveLocks, which were placed in the metaphyseal region of the humerus.  This was done with the arm in abduction.  All in all, a very stable repair was achieved.  We used the knotless suture mechanism sutures in the SwiveLocks for  added fixation on the rotator cuff tear.  At this time, thorough irrigation was performed.  Deltoid split was closed using #1 Vicryl suture followed by interrupted inverted 0 Vicryl suture, 2-0 Vicryl suture and 3-0 Monocryl with Steri-Strips and  impervious dressings and a shoulder sling applied.  The patient tolerated the procedure well without immediate complications.  Luke's assistance was required at all times for retraction, opening and closing, mobilization of tissue.  His assistance was of  medical  necessity.   PUS D: 06/11/2023 3:19:07 pm T: 06/11/2023 6:59:00 pm  JOB: [id number]/ [id number]

## 2023-06-12 ENCOUNTER — Encounter (HOSPITAL_COMMUNITY): Payer: Self-pay | Admitting: Orthopedic Surgery

## 2023-06-13 DIAGNOSIS — M7522 Bicipital tendinitis, left shoulder: Secondary | ICD-10-CM

## 2023-06-13 DIAGNOSIS — S43432A Superior glenoid labrum lesion of left shoulder, initial encounter: Secondary | ICD-10-CM

## 2023-06-13 DIAGNOSIS — M75122 Complete rotator cuff tear or rupture of left shoulder, not specified as traumatic: Secondary | ICD-10-CM

## 2023-06-13 DIAGNOSIS — M65912 Unspecified synovitis and tenosynovitis, left shoulder: Secondary | ICD-10-CM

## 2023-06-15 ENCOUNTER — Ambulatory Visit: Admitting: Orthopedic Surgery

## 2023-06-15 DIAGNOSIS — G5602 Carpal tunnel syndrome, left upper limb: Secondary | ICD-10-CM | POA: Diagnosis not present

## 2023-06-15 NOTE — Progress Notes (Signed)
   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  He is doing well overall, pain remains well-controlled.  Numbness and tingling is improved.  He does report some plateaus with weakness he feels particularly with thumb pinch and index DIP flexion.  Pertinent ROS were reviewed with the patient and found to be negative unless otherwise specified above in HPI.   Assessment & Plan: Visit Diagnoses:  1. Carpal tunnel syndrome, left upper limb     Plan: Extensive discussion was had the patient today regarding his left hand complaints.He has appeared to have plateaued, particular from a strength standpoint.  He has been doing occupational therapy as instructed.  We did discuss that his initial indication for surgery was based on response to prior injections, however he does not have prior EMG data in order to fully understand the quality of his motor endplates as well as the possibility of double crush or potential other sites of median nerve compression.  For this reason, given his ongoing symptoms, we have discussed moving forward with left upper extremity EMG in order to gain some more objective evidence regarding his progress.  This will help me if him more clear-cut expectations for strengthening moving forward.  Explained to him that strengthening is quite slow to return, which he is well aware of.  He expressed understanding of this plan, will obtain the left extremity EMG as prescribed today and will return to me for follow-up regarding results.   Follow-up: No follow-ups on file.   Meds & Orders: No orders of the defined types were placed in this encounter.   Orders Placed This Encounter  Procedures    Ambulatory referral to Physical Medicine Rehab     Procedures: No procedures performed       Objective:   Vital Signs: There were no vitals taken for this visit.  Ortho Exam Left hand - Well-healed palmar incision, skin edges well-approximated, no erythema or drainage - Able to perform composite fist without significant restriction - Sensation is intact to light touch in all distributions - 4+/5 APB, 4+/5 index finger FDP  Imaging: No results found.   Reuben Knoblock Alvia Jointer, M.D. [facility]

## 2023-06-19 ENCOUNTER — Other Ambulatory Visit (HOSPITAL_COMMUNITY): Payer: Self-pay

## 2023-06-19 ENCOUNTER — Encounter: Admitting: Orthopedic Surgery

## 2023-06-19 ENCOUNTER — Other Ambulatory Visit: Payer: Self-pay | Admitting: Internal Medicine

## 2023-06-19 DIAGNOSIS — E119 Type 2 diabetes mellitus without complications: Secondary | ICD-10-CM

## 2023-06-22 ENCOUNTER — Ambulatory Visit (INDEPENDENT_AMBULATORY_CARE_PROVIDER_SITE_OTHER): Admitting: Orthopedic Surgery

## 2023-06-22 DIAGNOSIS — Z9889 Other specified postprocedural states: Secondary | ICD-10-CM

## 2023-06-23 ENCOUNTER — Other Ambulatory Visit (HOSPITAL_COMMUNITY): Payer: Self-pay

## 2023-06-24 ENCOUNTER — Encounter: Payer: Self-pay | Admitting: Orthopedic Surgery

## 2023-06-24 NOTE — Progress Notes (Signed)
 Post-Op Visit Note   Patient: Adam Ware, Dr.           Date of Birth: 07/12/1950           MRN: [id number] Visit Date: 06/22/2023 PCP: Adam Knuckles, MD   Assessment & Plan:  Chief Complaint:  Chief Complaint  Patient presents with   Left Shoulder - Routine Post Op    06/11/23 (11d) Arthroscopy, Shoulder With Debridement - Left  Repair, Rotator Cuff, Open - Left      Visit Diagnoses:  1. S/P rotator cuff repair     Plan: [name] is now 10 days out from left shoulder arthroscopy with rotator cuff tear repair of 2 separate rotator cuff tears.  Also had significant biceps tendinitis and subluxation.  He has been doing CPM in the chair.  On exam his passive range of motion is good.  Has very good external rotation strength.  Subscap strength also intact.  No grinding or crepitus with passive range of motion.  Plan at this time is 2-week return.  Start physical therapy here next week for passive range of motion and isometrics.  Continue the sling for 2 more weeks.  We will discontinue the sling in 2 weeks.  I think if he wants to try some putting that would be okay but no chipping because we really need this rotator cuff tear to heal.  Prior to any significant forceful activity  Follow-Up Instructions: No follow-ups on file.   Orders:  Orders Placed This Encounter  Procedures   Ambulatory referral to Physical Therapy   No orders of the defined types were placed in this encounter.   Imaging: No results found.  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   Encounter for general adult medical examination with abnormal findings 06/12/2021   Carpal tunnel syndrome, left upper limb 11/28/2020   Carpal tunnel syndrome, right upper limb 11/28/2020   Erectile dysfunction due to arterial insufficiency 09/24/2020   Benign prostatic hyperplasia with weak urinary stream 05/05/2018   Hyperlipidemia LDL goal <100 05/05/2018   Pure hyperglyceridemia 05/05/2018   Psychophysiological insomnia 05/05/2018   Lumbar radiculopathy 07/15/2017   Hyperlipidemia associated with type 2 diabetes mellitus (HCC) 06/10/2017   Essential hypertension 06/10/2017   Type 2 diabetes mellitus without complication, without long-term current use of insulin  (HCC) 06/10/2017   Spondylolisthesis, lumbar region 04/21/2017   Past Medical History:  Diagnosis Date   Adrenal tumor    rigth   Arthritis    Cancer (HCC)    basal cell   Diabetes mellitus without complication (HCC)    type 2; tx metformin  and wkly ozempic  on Sundays   Hair loss    History of kidney stones    surgery to remove   Hyperlipidemia    tx w/atorvastatin    Hypertension    tx w/lisinopril    Insomnia    tx w/ambien     Family History  Problem Relation Age of Onset   Cancer Mother    Diabetes Mother    Mental illness Mother  Heart disease Father    Arthritis Brother     Past Surgical History:  Procedure Laterality Date   APPENDECTOMY     COLONOSCOPY     I & D KNEE WITH POLY EXCHANGE Right 01/24/2022   Procedure: IRRIGATION AND DEBRIDEMENT RIGHT KNEE WITH POLY EXCHANGE;  Surgeon: Timothy Ford, MD;  Location: [facility] OR;  Service: Orthopedics;  Laterality: Right;   IR URETERAL STENT LEFT NEW ACCESS W/O SEP NEPHROSTOMY CATH  04/13/2023   LITHOTRIPSY     MINOR CARPAL TUNNEL Left 11/07/2022   Procedure: MINOR CARPAL TUNNEL;  Surgeon: Merrill Abide, MD;  Location: [facility];  Service: Orthopedics;  Laterality: Left;  NEEDS RNFA (OPEN)   NEPHROLITHOTOMY  Left 04/13/2023   Procedure: LEFT NEPHROLITHOTOMY PERCUTANEOUS;  Surgeon: Samson Croak, MD;  Location: [facility];  Service: Urology;  Laterality: Left;  150 MINUTE CASE   POSTERIOR LUMBAR FUSION 2 WITH HARDWARE REMOVAL Left 06/11/2023   Procedure: ARTHROSCOPY, SHOULDER WITH DEBRIDEMENT;  Surgeon: Jasmine Mesi, MD;  Location[facility];  Service: Orthopedics;  Laterality: Left;  LEFT SHOULDER ARTHROSCOPY, DEBRIDEMENT   REPLACEMENT TOTAL KNEE Right    SHOULDER OPEN ROTATOR CUFF REPAIR Left 06/11/2023   Procedure: REPAIR, ROTATOR CUFF, OPEN;  Surgeon: Jasmine Mesi, MD;  Location: [facility] OR;  Service: Orthopedics;  Laterality: Left;  BICEPS TENODESIS, MINI OPEN ROTATOR CUFF TEAR REPAIR WITH CUFFMEND AUGMENTATION   TOTAL KNEE REVISION Right 04/22/2017   Procedure: REVISION RIGHT TOTAL KNEE ARTHROPLASTY VS. POLY EXCHANGE;  Surgeon: Timothy Ford, MD;  Location: [facility] OR;  Service: Orthopedics;  Laterality: Right;   Social History   Occupational History   Occupation: Part time  Tobacco Use   Smoking status: Never    Passive exposure: Never   Smokeless tobacco: Never  Vaping Use   Vaping status: Never Used  Substance and Sexual Activity   Alcohol use: Yes    Alcohol/week: 1.0 standard drink of alcohol    Types: 1 Glasses of wine per week    Comment: wine daily   Drug use: No   Sexual activity: Not Currently    Partners: Male

## 2023-06-29 ENCOUNTER — Ambulatory Visit: Admitting: Rehabilitative and Restorative Service Providers"

## 2023-07-03 ENCOUNTER — Encounter: Payer: Self-pay | Admitting: Physical Therapy

## 2023-07-03 ENCOUNTER — Other Ambulatory Visit: Payer: Self-pay

## 2023-07-03 ENCOUNTER — Other Ambulatory Visit: Payer: Self-pay | Admitting: Internal Medicine

## 2023-07-03 ENCOUNTER — Ambulatory Visit: Admitting: Physical Medicine and Rehabilitation

## 2023-07-03 ENCOUNTER — Ambulatory Visit: Admitting: Physical Therapy

## 2023-07-03 ENCOUNTER — Encounter: Payer: Self-pay | Admitting: Orthopedic Surgery

## 2023-07-03 DIAGNOSIS — Z9889 Other specified postprocedural states: Secondary | ICD-10-CM | POA: Diagnosis not present

## 2023-07-03 DIAGNOSIS — R202 Paresthesia of skin: Secondary | ICD-10-CM

## 2023-07-03 DIAGNOSIS — M6281 Muscle weakness (generalized): Secondary | ICD-10-CM | POA: Diagnosis not present

## 2023-07-03 DIAGNOSIS — M25612 Stiffness of left shoulder, not elsewhere classified: Secondary | ICD-10-CM

## 2023-07-03 DIAGNOSIS — M25512 Pain in left shoulder: Secondary | ICD-10-CM

## 2023-07-03 DIAGNOSIS — R293 Abnormal posture: Secondary | ICD-10-CM

## 2023-07-03 DIAGNOSIS — E119 Type 2 diabetes mellitus without complications: Secondary | ICD-10-CM

## 2023-07-03 DIAGNOSIS — R29898 Other symptoms and signs involving the musculoskeletal system: Secondary | ICD-10-CM | POA: Diagnosis not present

## 2023-07-03 NOTE — Progress Notes (Signed)
 Pain Scale   Average Pain 0 Patient advising he has numbness and tingling and decreased motor function Post surgery( patient states" I'm at 75% with usage")        +Driver, -BT, -Dye Allergies.

## 2023-07-03 NOTE — Therapy (Signed)
 OUTPATIENT PHYSICAL THERAPY SHOULDER EVALUATION   Patient Name: Adam Ware, Dr. MRN: [id number] DOB:October 31, 1950, 73 y.o., male Today's Date: 07/03/2023  END OF SESSION:  PT End of Session - 07/03/23 1433     Visit Number 1    Number of Visits 17    Date for PT Re-Evaluation 07/29/23    Authorization Type HTA    Authorization Time Period 07/03/23 to 08/28/23    PT Start Time 1302    PT Stop Time 1340    PT Time Calculation (min) 38 min    Activity Tolerance Patient tolerated treatment well    Behavior During Therapy WFL for tasks assessed/performed             Past Medical History:  Diagnosis Date   Adrenal tumor    rigth   Arthritis    Cancer (HCC)    basal cell   Diabetes mellitus without complication (HCC)    type 2; tx metformin  and wkly ozempic  on Sundays   Hair loss    History of kidney stones    surgery to remove   Hyperlipidemia    tx w/atorvastatin    Hypertension    tx w/lisinopril    Insomnia    tx w/ambien    Past Surgical History:  Procedure Laterality Date   APPENDECTOMY     COLONOSCOPY     I & D KNEE WITH POLY EXCHANGE Right 01/24/2022   Procedure: IRRIGATION AND DEBRIDEMENT RIGHT KNEE WITH POLY EXCHANGE;  Surgeon: Timothy Ford, MD;  Location: [facility] OR;  Service: Orthopedics;  Laterality: Right;   IR URETERAL STENT LEFT NEW ACCESS W/O SEP NEPHROSTOMY CATH  04/13/2023   LITHOTRIPSY     MINOR CARPAL TUNNEL Left 11/07/2022   Procedure: MINOR CARPAL TUNNEL;  Surgeon: Merrill Abide, MD;  Location: [facility];  Service: Orthopedics;  Laterality: Left;  NEEDS RNFA (OPEN)   NEPHROLITHOTOMY Left 04/13/2023   Procedure: LEFT NEPHROLITHOTOMY PERCUTANEOUS;  Surgeon: Samson Croak, MD;  Location: [facility];  Service: Urology;  Laterality: Left;  150 MINUTE CASE   POSTERIOR LUMBAR FUSION 2 WITH HARDWARE REMOVAL Left 06/11/2023   Procedure: ARTHROSCOPY, SHOULDER WITH DEBRIDEMENT;  Surgeon: Jasmine Mesi, MD;  Location: [facility] OR;  Service:  Orthopedics;  Laterality: Left;  LEFT SHOULDER ARTHROSCOPY, DEBRIDEMENT   REPLACEMENT TOTAL KNEE Right    SHOULDER OPEN ROTATOR CUFF REPAIR Left 06/11/2023   Procedure: REPAIR, ROTATOR CUFF, OPEN;  Surgeon: Jasmine Mesi, MD;  Location[facility] OR;  Service: Orthopedics;  Laterality: Left;  BICEPS TENODESIS, MINI OPEN ROTATOR CUFF TEAR REPAIR WITH CUFFMEND AUGMENTATION   TOTAL KNEE REVISION Right 04/22/2017   Procedure: REVISION RIGHT TOTAL KNEE ARTHROPLASTY VS. 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   Encounter for general adult medical examination with abnormal findings 06/12/2021   Carpal tunnel syndrome, left upper limb 11/28/2020   Carpal tunnel syndrome, right upper limb 11/28/2020   Erectile dysfunction due to arterial insufficiency 09/24/2020  Benign prostatic hyperplasia with weak urinary stream 05/05/2018   Hyperlipidemia LDL goal <100 05/05/2018   Pure hyperglyceridemia 05/05/2018   Psychophysiological insomnia 05/05/2018   Lumbar radiculopathy 07/15/2017   Hyperlipidemia associated with type 2 diabetes mellitus (HCC) 06/10/2017   Essential hypertension 06/10/2017   Type 2 diabetes mellitus without complication, without long-term current use of insulin  (HCC) 06/10/2017   Spondylolisthesis, lumbar region 04/21/2017    PCP: Sandra Crouch MD   REFERRING PROVIDER: Jasmine Mesi, MD  REFERRING DIAG: Diagnosis [phone] (ICD-10-CM)  - S/P rotator cuff repair  THERAPY DIAG:  Muscle weakness (generalized)  Acute pain of left shoulder  Abnormal posture  Stiffness of left shoulder, not elsewhere classified  Rationale for Evaluation and Treatment: Rehabilitation  ONSET DATE: surgery 06/11/23  SUBJECTIVE:                                                                                                                                                                                      SUBJECTIVE STATEMENT:   I've been good, have been wearing the sling but stopped with CPM because the machine malfunctioned. Moving shoulder doesn't hurt but I'm being careful to not do it too much. Having been getting in my jacuzzi and letting the water help me with passive motion. Dr. Rozelle Corning said that I have a 50% chance of this happening on the other side.     Hand dominance: Left  PERTINENT HISTORY:  See above   PAIN:  Are you having pain? No  PRECAUTIONS:  left shoulder s/p open RCR on 06/11/23 PROM and isometric per MD referral   RED FLAGS: None   WEIGHT BEARING RESTRICTIONS: Yes NWB surgical shoulder   FALLS:  Has patient fallen in last 6 months? No  LIVING ENVIRONMENT: Lives with: lives alone Lives in: House/apartment  OCCUPATION: MD  PLOF: Independent, Independent with basic ADLs, Independent with gait, and Independent with transfers  PATIENT GOALS:  Get shoulder functional again    NEXT MD VISIT:   OBJECTIVE:  Note: Objective measures were completed at Evaluation unless otherwise noted.    PATIENT SURVEYS:    Patient-Specific Activity Scoring Scheme  "0" represents "unable to perform." "10" represents "able to perform at prior level. 0 1 2 3 4 5 6 7 8 9  10 (Date and Score)   Activity Eval     1. Washing hair   7    2. Reaching into opposite armpit   3    3. Personal hygiene  2   4.    5.    Score 4    Total score = sum of the activity scores/number of activities Minimum detectable  change (  90%CI) for average score = 2 points Minimum detectable change (90%CI) for single activity score = 3 points     COGNITION: Overall cognitive status: Within functional limits for tasks assessed       UPPER EXTREMITY ROM:   Passive ROM Right eval Left eval  Shoulder flexion  110*  Shoulder extension    Shoulder scaption   100*  Shoulder adduction    Shoulder internal rotation  Hand to belly at 0* ABD   Shoulder external rotation  About 20* at 0* ABD  Elbow flexion    Elbow extension    Wrist flexion    Wrist extension    Wrist ulnar deviation    Wrist radial deviation    Wrist pronation    Wrist supination    (Blank rows = not tested)  UPPER EXTREMITY MMT:  MMT Right eval Left eval  Shoulder flexion    Shoulder extension    Shoulder abduction    Shoulder adduction    Shoulder internal rotation    Shoulder external rotation    Middle trapezius    Lower trapezius    Elbow flexion    Elbow extension    Wrist flexion    Wrist extension    Wrist ulnar deviation    Wrist radial deviation    Wrist pronation    Wrist supination    Grip strength (lbs)    (Blank rows = not tested)  MMT not checked at eval due to surgical precautions     PALPATION:   Very tight in anterior shoulder musculature and into bicep                                                                                                                              TREATMENT DATE:   07/03/23  Eval, HEP, POC  Lots of education on maintaining post-op shoulder precautions and allowing soft tissues to heal, lots of education on why it is very beneficial for him to attend PT in PROM/isometric stage to prevent backsliding or even frozen shoulder   PROM for shoulder flexion/scaption/IR/ER as tolerated and appropriate    PATIENT EDUCATION: Education details: as above  Person educated: Patient Education method: Programmer, multimedia, Facilities manager, and Handouts Education comprehension: verbalized  understanding, returned demonstration, and needs further education  HOME EXERCISE PROGRAM: Access Code: [email or web address] + isometrics from prior HEP (no ER isometrics yet) URL: [email or web address] Date: 07/03/2023 Prepared by: Terrel Ferries  Exercises - Flexion-Extension Shoulder Pendulum with Table Support  - 2-3 x daily - 7 x weekly - 1 sets - 10 reps - Horizontal Shoulder Pendulum with Table Support  - 2-3 x daily - 7 x weekly - 1 sets - 10 reps - Circular Shoulder Pendulum with Table Support  - 2-3 x daily - 7 x weekly - 1 sets - 10 reps - Seated Scapular Retraction  - 2-3 x daily - 7 x weekly - 1 sets - 10 reps - Wrist AROM Flexion  Extension  - 2-3 x daily - 7 x weekly - 1 sets - 10 reps - Seated Gripping Towel  - 2-3 x daily - 7 x weekly - 1 sets - 10 reps  ASSESSMENT:  CLINICAL IMPRESSION: Patient is a 73 y.o. M who was seen today for physical therapy evaluation and treatment for Diagnosis [id number] (ICD-10-CM) - S/P rotator cuff repair. Objectives as above. We will need to slow him down and make sure he is only participating in activities appropriate for his stage of healing, suspect he may be moving a little quickly in terms of how much he is using his surgical UE at home (had to remind him of sling/passive ROM multiple times throughout eval). Had do to some education and convincing about 2x/week frequency in PROM stage. Anticipate he will do well with PT overall.   OBJECTIVE IMPAIRMENTS: decreased ROM, decreased strength, hypomobility, increased edema, increased fascial restrictions, increased muscle spasms, impaired flexibility, impaired UE functional use, improper body mechanics, postural dysfunction, and pain.   ACTIVITY LIMITATIONS: carrying, lifting, bathing, dressing, reach over head, hygiene/grooming, and caring for others  PARTICIPATION LIMITATIONS: meal prep, cleaning, laundry, driving, shopping, community activity, occupation, and yard work  PERSONAL FACTORS: Age,  Behavior pattern, Education, Past/current experiences, Social background, and Time since onset of injury/illness/exacerbation are also affecting patient's functional outcome.   REHAB POTENTIAL: Good  CLINICAL DECISION MAKING: Stable/uncomplicated  EVALUATION COMPLEXITY: Low   GOALS: Goals reviewed with patient? No  SHORT TERM GOALS: Target date: 07/31/2023    Patient will be compliant with appropriate progressive HEP        GOAL STATUS: Initial  2. L shoulder flexion and ABD A/PROM to be at least 130*         GOAL STATUS: Initial    3. L shoulder IR and ER A/PROM to be at least 45* at 45* ABD away from trunk           GOAL STATUS: Initial    4. Will be more aware of posture with all functional tasks with use of ergonomic aides PRN/as desired      GOAL STATUS: Initial    LONG TERM GOALS: Target date: 08/28/2023    MMT to have improved by at least one grade in all weak groups       GOAL STATUS: Initial    2. AROM to have normalized and will be pain free all planes of motion      GOAL STATUS: Initial    3. Pain to be no more than 2/10 with all functional tasks     GOAL STATUS: Initial   4. Will be able to perform all functional household and work based tasks without increase from resting pain levels     GOAL STATUS: Initial   5. PSFS  to have improved by at least 7 to show improved QOL and subjective perception of condition      GOAL STATUS: Initial   PLAN:  PT FREQUENCY: 2x/week  PT DURATION: 8 weeks  PLANNED INTERVENTIONS: 97750- Physical Performance Testing, 97110-Therapeutic exercises, 97530- Therapeutic activity, [id number]- Neuromuscular re-education, 97535- Self Care, 16109- Manual therapy, 97016- Vasopneumatic device, Taping, and Dry Needling  PLAN FOR NEXT SESSION: PROM and isometrics only for now per MD order   Terrel Ferries, PT, DPT 07/03/23 3:36 PM

## 2023-07-06 ENCOUNTER — Encounter: Payer: Self-pay | Admitting: Orthopedic Surgery

## 2023-07-06 ENCOUNTER — Ambulatory Visit (INDEPENDENT_AMBULATORY_CARE_PROVIDER_SITE_OTHER): Admitting: Orthopedic Surgery

## 2023-07-06 ENCOUNTER — Other Ambulatory Visit (HOSPITAL_COMMUNITY): Payer: Self-pay

## 2023-07-06 DIAGNOSIS — Z9889 Other specified postprocedural states: Secondary | ICD-10-CM

## 2023-07-06 MED ORDER — METFORMIN HCL ER 500 MG PO TB24
500.0000 mg | ORAL_TABLET | Freq: Every day | ORAL | 0 refills | Status: DC
  Filled 2023-07-06: qty 90, 90d supply, fill #0

## 2023-07-07 ENCOUNTER — Telehealth: Payer: Self-pay

## 2023-07-07 ENCOUNTER — Encounter: Payer: Self-pay | Admitting: Orthopedic Surgery

## 2023-07-07 NOTE — Progress Notes (Signed)
 Post-Op Visit Note   Patient: Adam Ware, Dr.           Date of Birth: November 15, 1950           MRN: [id number] Visit Date: 07/06/2023 PCP: Arcadio Knuckles, MD   Assessment & Plan:  Chief Complaint:  Chief Complaint  Patient presents with   Left Shoulder - Follow-up    06/11/23 (11d)   Arthroscopy, Shoulder With Debridement - Left      Repair, Rotator Cuff, Open - Left     Visit Diagnoses: No diagnosis found.  Plan: Clint is a patient who is now about 3 weeks out left shoulder arthroscopy with rotator cuff tear repair of a complex rotator cuff tear.  He did use the CPM machine for a week.  Doing a home exercise program currently.  Has been in a sling.  He is reporting a little bit of right sided shoulder aggravation without discrete weakness or mechanical symptoms.  He is also still dealing with his left hand carpal tunnel release which has been complicated by weakness and recurrent paresthesias.  On examination Chidera has good passive range of motion and improving strength with external rotation.  No coarseness or grinding with internal/external rotation at 90 degrees of abduction.  He currently has very good range of motion on the left of 55/90/120 passively.  Does not have that actively quite yet.  Plan at this time is discontinue sling.  No lifting with left arm.  I think he is fine to start doing some putting.  I would wait at least a week possibly 2 weeks before doing any straight arm chipping.  I think would be good for him to start strengthening and physical therapy 3 weeks from now which would be 6 weeks postop.  We will see him back in 4 weeks for clinical recheck on the left shoulder.  We did look at the right shoulder with ultrasound and he does have some biceps abnormality on the transverse view.  Partial-thickness rotator cuff tearing with tendinosis of the supraspinatus visible on ultrasound but no definite full-thickness tear.  I think this is something we can watch for  now.  Follow-Up Instructions: No follow-ups on file.   Orders:  No orders of the defined types were placed in this encounter.  No orders of the defined types were placed in this encounter.   Imaging: No results found.  PMFS History: Patient Active Problem List   Diagnosis Date Noted   Synovitis of left shoulder 06/13/2023   Degenerative superior labral anterior-to-posterior (SLAP) tear of left shoulder 06/13/2023   Nontraumatic complete tear of left rotator cuff 06/13/2023   Biceps tendonitis on left 06/13/2023   Renal calculi 04/13/2023   Gross hematuria 02/13/2023   Acute prostatitis 02/13/2023   Right adrenal mass (HCC) 02/13/2023   Left renal stone 02/13/2023   High serum lipoprotein(a) 09/05/2022   Encounter for general adult medical examination with abnormal findings 06/12/2021   Carpal tunnel syndrome, left upper limb 11/28/2020   Carpal tunnel syndrome, right upper limb 11/28/2020   Erectile dysfunction due to arterial insufficiency 09/24/2020   Benign prostatic hyperplasia with weak urinary stream 05/05/2018   Hyperlipidemia LDL goal <100 05/05/2018   Pure hyperglyceridemia 05/05/2018   Psychophysiological insomnia 05/05/2018   Lumbar radiculopathy 07/15/2017   Hyperlipidemia associated with type 2 diabetes mellitus (HCC) 06/10/2017   Essential hypertension 06/10/2017   Type 2 diabetes mellitus without complication, without long-term current use of insulin  (HCC) 06/10/2017  Spondylolisthesis, lumbar region 04/21/2017   Past Medical History:  Diagnosis Date   Adrenal tumor    rigth   Arthritis    Cancer (HCC)    basal cell   Diabetes mellitus without complication (HCC)    type 2; tx metformin  and wkly ozempic  on Sundays   Hair loss    History of kidney stones    surgery to remove   Hyperlipidemia    tx w/atorvastatin    Hypertension    tx w/lisinopril    Insomnia    tx w/ambien     Family History  Problem Relation Age of Onset   Cancer Mother     Diabetes Mother    Mental illness Mother    Heart disease Father    Arthritis Brother     Past Surgical History:  Procedure Laterality Date   APPENDECTOMY     COLONOSCOPY     I & D KNEE WITH POLY EXCHANGE Right 01/24/2022   Procedure: IRRIGATION AND DEBRIDEMENT RIGHT KNEE WITH POLY EXCHANGE;  Surgeon: Timothy Ford, MD;  Location: [facility];  Service: Orthopedics;  Laterality: Right;   IR URETERAL STENT LEFT NEW ACCESS W/O SEP NEPHROSTOMY CATH  04/13/2023   LITHOTRIPSY     MINOR CARPAL TUNNEL Left 11/07/2022   Procedure: MINOR CARPAL TUNNEL;  Surgeon: Merrill Abide, MD;  Location: [facility];  Service: Orthopedics;  Laterality: Left;  NEEDS RNFA (OPEN)   NEPHROLITHOTOMY Left 04/13/2023   Procedure: LEFT NEPHROLITHOTOMY PERCUTANEOUS;  Surgeon: Samson Croak, MD;  Location: [facility];  Service: Urology;  Laterality: Left;  150 MINUTE CASE   POSTERIOR LUMBAR FUSION 2 WITH HARDWARE REMOVAL Left 06/11/2023   Procedure: ARTHROSCOPY, SHOULDER WITH DEBRIDEMENT;  Surgeon: Jasmine Mesi, MD;  Location[facility] OR;  Service: Orthopedics;  Laterality: Left;  LEFT SHOULDER ARTHROSCOPY, DEBRIDEMENT   REPLACEMENT TOTAL KNEE Right    SHOULDER OPEN ROTATOR CUFF REPAIR Left 06/11/2023   Procedure: REPAIR, ROTATOR CUFF, OPEN;  Surgeon: Jasmine Mesi, MD;  Location: [facility] OR;  Service: Orthopedics;  Laterality: Left;  BICEPS TENODESIS, MINI OPEN ROTATOR CUFF TEAR REPAIR WITH CUFFMEND AUGMENTATION   TOTAL KNEE REVISION Right 04/22/2017   Procedure: REVISION RIGHT TOTAL KNEE ARTHROPLASTY VS. POLY EXCHANGE;  Surgeon: Timothy Ford, MD;  Location: [facility];  Service: Orthopedics;  Laterality: Right;   Social History   Occupational History   Occupation: Part time  Tobacco Use   Smoking status: Never    Passive exposure: Never   Smokeless tobacco: Never  Vaping Use   Vaping status: Never Used  Substance and Sexual Activity   Alcohol use: Yes    Alcohol/week: 1.0 standard drink of alcohol     Types: 1 Glasses of wine per week    Comment: wine daily   Drug use: No   Sexual activity: Not Currently    Partners: Male

## 2023-07-07 NOTE — Telephone Encounter (Signed)
 Dr. Merlinda Starling called Dr. Robina Chol and reviewed EMG with him.

## 2023-07-07 NOTE — Telephone Encounter (Signed)
 Dr. Robina Chol calling asking for Dr. Merlinda Starling to call him back [phone]

## 2023-07-08 NOTE — Procedures (Signed)
 EMG & NCV Findings: Evaluation of the left median motor nerve showed prolonged distal onset latency (4.9 ms), reduced amplitude (1.2 mV), and decreased conduction velocity (Elbow-Wrist, 46 m/s).  The left ulnar motor nerve showed decreased conduction velocity (B Elbow-Wrist, 52 m/s) and decreased conduction velocity (A Elbow-B Elbow, 50 m/s).  The left median (across palm) sensory nerve showed prolonged distal peak latency (Wrist, 4.8 ms), reduced amplitude (5.1 V), and prolonged distal peak latency (Palm, 2.4 ms).  The left ulnar sensory nerve showed reduced amplitude (14.4 V).  All remaining nerves (as indicated in the following tables) were within normal limits.    All examined muscles (as indicated in the following table) showed no evidence of electrical instability.    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  Unfortunately, without prior electrodiagnostic study I cannot conclusively say if this is residual or really onset of compression.  From the clinical scenario it seems to be somewhat residual however there was a point in time where the weakness worsened several months postsurgery.  There is no significant electrodiagnostic evidence of any other focal nerve entrapment, brachial plexopathy or cervical radiculopathy.   Recommendations: 1.  Follow-up with referring physician. 2.  Continue current management of symptoms. 3.  Suggest possibility of ultrasound or MRI imaging to evaluate the surgical site and tunnel. 4.  Repeat studies in 3-4 months if symptoms are progressive or not resolved.  ___________________________ Collin Deal FAAPMR Board Certified, American Board of Physical Medicine and Rehabilitation    Nerve Conduction Studies Anti Sensory Summary Table   Stim Site NR Peak (ms) Norm Peak (ms) P-T Amp (V) Norm P-T Amp  Site1 Site2 Delta-P (ms) Dist (cm) Vel (m/s) Norm Vel (m/s)  Left Median Acr Palm Anti Sensory (2nd Digit)  31.7C  Wrist    *4.8 <3.6 *5.1 >10 Wrist Palm 2.4 0.0    Palm    *2.4 <2.0 6.4         Left Radial Anti Sensory (Base 1st Digit)  31.1C  Wrist    2.5 <3.1 13.1  Wrist Base 1st Digit 2.5 0.0    Left Ulnar Anti Sensory (5th Digit)  31.8C  Wrist    3.3 <3.7 *14.4 >15.0 Wrist 5th Digit 3.3 14.0 42 >38   Motor Summary Table   Stim Site NR Onset (ms) Norm Onset (ms) O-P Amp (mV) Norm O-P Amp Site1 Site2 Delta-0 (ms) Dist (cm) Vel (m/s) Norm Vel (m/s)  Left Median Motor (Abd Poll Brev)  31.5C  Wrist    *4.9 <4.2 *1.2 >5 Elbow Wrist 6.0 27.5 *46 >50  Elbow    10.9  0.5         Left Ulnar Motor (Abd Dig Min)  31.5C  Wrist    3.1 <4.2 6.8 >3 B Elbow Wrist 4.6 24.0 *52 >53  B Elbow    7.7  6.5  A Elbow B Elbow 2.0 10.0 *50 >53  A Elbow    9.7  6.4          EMG   Side Muscle Nerve Root Ins Act Fibs Psw Amp Dur Poly Recrt Int Deatra Face Comment  Left Abd Poll Brev Median C8-T1 Nml Nml Nml Nml Nml 0 Nml Nml   Left 1stDorInt Ulnar C8-T1 Nml Nml Nml Nml Nml 0 Nml Nml   Left PronatorTeres Median C6-7 Nml Nml Nml Nml Nml 0 Nml Nml  Left Biceps Musculocut C5-6 Nml Nml Nml Nml Nml 0 Nml Nml   Left Deltoid Axillary C5-6 Nml Nml Nml Nml Nml 0 Nml Nml     Nerve Conduction Studies Anti Sensory Left/Right Comparison   Stim Site L Lat (ms) R Lat (ms) L-R Lat (ms) L Amp (V) R Amp (V) L-R Amp (%) Site1 Site2 L Vel (m/s) R Vel (m/s) L-R Vel (m/s)  Median Acr Palm Anti Sensory (2nd Digit)  31.7C  Wrist *4.8   *5.1   Wrist Palm     Palm *2.4   6.4         Radial Anti Sensory (Base 1st Digit)  31.1C  Wrist 2.5   13.1   Wrist Base 1st Digit     Ulnar Anti Sensory (5th Digit)  31.8C  Wrist 3.3   *14.4   Wrist 5th Digit 42     Motor Left/Right Comparison   Stim Site L Lat (ms) R Lat (ms) L-R Lat (ms) L Amp (mV) R Amp (mV) L-R Amp (%) Site1 Site2 L Vel (m/s) R Vel (m/s) L-R Vel (m/s)  Median Motor  (Abd Poll Brev)  31.5C  Wrist *4.9   *1.2   Elbow Wrist *46    Elbow 10.9   0.5         Ulnar Motor (Abd Dig Min)  31.5C  Wrist 3.1   6.8   B Elbow Wrist *52    B Elbow 7.7   6.5   A Elbow B Elbow *50    A Elbow 9.7   6.4            Waveforms:

## 2023-07-08 NOTE — Telephone Encounter (Signed)
 Dr. Merlinda Starling called patient back and spoke with him about EMG

## 2023-07-09 ENCOUNTER — Encounter: Payer: Self-pay | Admitting: Physical Medicine and Rehabilitation

## 2023-07-09 NOTE — Progress Notes (Signed)
 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 Anshul Agarwala for evaluation and management of chronic, worsening and severe pain, numbness and tingling in the Left upper extremities.  Patient is Left hand dominant.  He has history of having carpal tunnel syndrome was quite well-documented in the notes.  As far back as 2021 he was seen by Dr. Rey Catholic and received injections for carpal tunnel syndrome bilaterally.  He then went on to see Dr. Marilyn Shropshire in the office and again received carpal tunnel injections over that time.  Most recently saw Dr. Shauna Del for carpal tunnel injections.  When those were becoming less beneficial he saw Dr. Marce Sensing and ultimately had open carpal tunnel release on 11/07/2022.  Electrodiagnostic study prior to the surgery had not been obtained and he had not had prior electrodiagnostic studies to review.  Postsurgery he reports he did get relief of symptoms but he still had some tingling but overall felt much better.  Then after couple of months he began having worsening symptoms with profound weakness in the radial digits.  He has been undergoing occupational therapy and reports some increase in strength.    Review of Systems  Musculoskeletal:  Positive for back pain and joint pain.  Neurological:  Positive for tingling and focal weakness.  All other systems reviewed and are negative.  Otherwise per HPI.  Assessment & Plan: Visit Diagnoses:    ICD-10-CM   1. Paresthesia of skin  R20.2 NCV with EMG (electromyography)    2. Left hand weakness  R29.898     3. 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  Electrodiagnostic study performed today.  The above electrodiagnostic study is ABNORMAL and reveals evidence of a severe left median nerve entrapment at the wrist (carpal tunnel syndrome) affecting sensory and motor components.  Unfortunately, without prior electrodiagnostic study I cannot conclusively say if this is residual or really onset of compression.  From the clinical scenario it seems to be somewhat residual however there was a point in time where the weakness worsened several months postsurgery.  There is no significant electrodiagnostic evidence of any other focal nerve entrapment, brachial plexopathy or cervical radiculopathy.   Recommendations: 1.  Follow-up with referring physician. 2.  Continue current management of symptoms. 3.  Suggest possibility of ultrasound or MRI imaging to evaluate the surgical site and tunnel. 4.  Repeat studies in 3-4 months if symptoms are progressive or not resolved.  Meds & Orders: No orders of the defined types were placed in this encounter.   Orders Placed This Encounter  Procedures   NCV with EMG (electromyography)    Follow-up: Return for Anshul Agarwala, MD.   Procedures: No procedures performed  EMG & NCV Findings: Evaluation of the left median motor nerve showed prolonged distal onset latency (4.9 ms), reduced amplitude (1.2 mV), and decreased conduction velocity (Elbow-Wrist, 46 m/s).  The left ulnar motor nerve  showed decreased conduction velocity (B Elbow-Wrist, 52 m/s) and decreased conduction velocity (A Elbow-B Elbow, 50 m/s).  The left median (across palm) sensory nerve showed prolonged distal peak latency (Wrist, 4.8 ms), reduced amplitude (5.1 V), and prolonged distal peak latency (Palm, 2.4 ms).  The left ulnar sensory nerve showed reduced amplitude (14.4  V).  All remaining nerves (as indicated in the following tables) were within normal limits.    All examined muscles (as indicated in the following table) showed no evidence of electrical instability.    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  Unfortunately, without prior electrodiagnostic study I cannot conclusively say if this is residual or really onset of compression.  From the clinical scenario it seems to be somewhat residual however there was a point in time where the weakness worsened several months postsurgery.  There is no significant electrodiagnostic evidence of any other focal nerve entrapment, brachial plexopathy or cervical radiculopathy.   Recommendations: 1.  Follow-up with referring physician. 2.  Continue current management of symptoms. 3.  Suggest possibility of ultrasound or MRI imaging to evaluate the surgical site and tunnel. 4.  Repeat studies in 3-4 months if symptoms are progressive or not resolved.  ___________________________ Collin Deal FAAPMR Board Certified, American Board of Physical Medicine and Rehabilitation    Nerve Conduction Studies Anti Sensory Summary Table   Stim Site NR Peak (ms) Norm Peak (ms) P-T Amp (V) Norm P-T Amp Site1 Site2 Delta-P (ms) Dist (cm) Vel (m/s) Norm Vel (m/s)  Left Median Acr Palm Anti Sensory (2nd Digit)  31.7C  Wrist    *4.8 <3.6 *5.1 >10 Wrist Palm 2.4 0.0    Palm    *2.4 <2.0 6.4         Left Radial Anti Sensory (Base 1st Digit)  31.1C  Wrist    2.5 <3.1 13.1  Wrist Base 1st Digit 2.5 0.0    Left Ulnar Anti Sensory (5th Digit)  31.8C  Wrist    3.3 <3.7 *14.4 >15.0 Wrist 5th Digit 3.3 14.0 42 >38   Motor Summary Table   Stim Site NR Onset (ms) Norm Onset (ms) O-P Amp (mV) Norm O-P Amp Site1 Site2 Delta-0 (ms) Dist (cm) Vel (m/s) Norm Vel (m/s)  Left Median  Motor (Abd Poll Brev)  31.5C  Wrist    *4.9 <4.2 *1.2 >5 Elbow Wrist 6.0 27.5 *46 >50  Elbow    10.9  0.5         Left Ulnar Motor (Abd Dig Min)  31.5C  Wrist    3.1 <4.2 6.8 >3 B Elbow Wrist 4.6 24.0 *52 >53  B Elbow    7.7  6.5  A Elbow B Elbow 2.0 10.0 *50 >53  A Elbow    9.7  6.4          EMG   Side Muscle Nerve Root Ins Act Fibs Psw Amp Dur Poly Recrt Int Deatra Face Comment  Left Abd Poll Brev Median C8-T1 Nml Nml Nml Nml Nml 0 Nml Nml   Left 1stDorInt Ulnar C8-T1 Nml Nml Nml Nml Nml 0 Nml Nml   Left PronatorTeres Median C6-7 Nml Nml Nml Nml Nml 0 Nml Nml   Left Biceps Musculocut C5-6 Nml Nml Nml Nml Nml 0 Nml Nml   Left Deltoid Axillary C5-6 Nml Nml Nml Nml Nml 0 Nml Nml     Nerve Conduction  Studies Anti Sensory Left/Right Comparison   Stim Site L Lat (ms) R Lat (ms) L-R Lat (ms) L Amp (V) R Amp (V) L-R Amp (%) Site1 Site2 L Vel (m/s) R Vel (m/s) L-R Vel (m/s)  Median Acr Palm Anti Sensory (2nd Digit)  31.7C  Wrist *4.8   *5.1   Wrist Palm     Palm *2.4   6.4         Radial Anti Sensory (Base 1st Digit)  31.1C  Wrist 2.5   13.1   Wrist Base 1st Digit     Ulnar Anti Sensory (5th Digit)  31.8C  Wrist 3.3   *14.4   Wrist 5th Digit 42     Motor Left/Right Comparison   Stim Site L Lat (ms) R Lat (ms) L-R Lat (ms) L Amp (mV) R Amp (mV) L-R Amp (%) Site1 Site2 L Vel (m/s) R Vel (m/s) L-R Vel (m/s)  Median Motor (Abd Poll Brev)  31.5C  Wrist *4.9   *1.2   Elbow Wrist *46    Elbow 10.9   0.5         Ulnar Motor (Abd Dig Min)  31.5C  Wrist 3.1   6.8   B Elbow Wrist *52    B Elbow 7.7   6.5   A Elbow B Elbow *50    A Elbow 9.7   6.4            Waveforms:            Clinical History: MRI LUMBAR SPINE WITHOUT CONTRAST   TECHNIQUE:  Multiplanar, multisequence MR imaging of the lumbar spine was  performed. No intravenous contrast was administered.   COMPARISON: Lumbar spine radiographs 04/21/2017. CT abdomen and  pelvis 03/25/2014.   FINDINGS:  Segmentation:  Standard.   Alignment: 8 mm anterolisthesis of L5 on S1 due to bilateral L5 pars  defects.   Vertebrae: No acute fracture or suspicious osseous lesion. Mild  left-sided L4 inferior endplate edema, likely degenerative with a  small Schmorl's node also present in this location.   Conus medullaris and cauda equina: Conus extends to the L1-2 level.  Conus and cauda equina appear normal.   Paraspinal and other soft tissues: Unremarkable.   Disc levels:   L1-2: Negative.   L2-3: Mild disc desiccation. Minimal disc bulging without stenosis.   L3-4: Mild disc desiccation. Minimal disc bulging and mild facet  arthrosis without stenosis.   L4-5: Mild disc desiccation. Mild disc bulging and moderate facet  hypertrophy result in mild left neural foraminal stenosis without  spinal stenosis. A 5 x 1 mm cyst is noted along the left ligamentum  flavum, not resulting in neural impingement.   L5-S1: Disc desiccation and severe disc space narrowing.  Anterolisthesis, disc space height loss, and bulging uncovered disc  result in severe bilateral neural foraminal stenosis with L5 nerve  root compression. No spinal stenosis.   IMPRESSION:  1. Bilateral L5 pars defects with grade 1 anterolisthesis, advanced  disc degeneration, and severe bilateral neural foraminal stenosis  with L5 nerve root compression.  2. Mild disc bulging and moderate facet hypertrophy at L4-5  resulting in mild left neural foraminal stenosis.    Electronically Signed  By: Aundra Lee M.D.  On: 05/01/2017 17:09     Objective:  VS:  HT:    WT:   BMI:     BP:   HR: bpm  TEMP: ( )  RESP:  Physical Exam Musculoskeletal:  General: Tenderness present.     Comments: Inspection reveals well-healed carpal tunnel release incision on the left and some flattening of the left APB but no frank atrophy and no atrophy of the bilateral FDI or hand intrinsics. There is no swelling, color changes, allodynia or dystrophic  changes. There is 5 out of 5 strength in the bilateral wrist extension, finger abduction and long finger flexion.  He has some weakness with grip on the left as well as thumb abduction.  There is impaired sensation to light touch in the median nerve distribution on the left. There is a negative Tinel's test at the bilateral wrist and elbow. There is a negative Phalen's test bilaterally. There is a negative Hoffmann's test bilaterally.  Skin:    General: Skin is warm and dry.     Findings: No erythema or rash.  Neurological:     General: No focal deficit present.     Mental Status: He is alert and oriented to person, place, and time.     Cranial Nerves: No cranial nerve deficit.     Sensory: Sensory deficit present.     Motor: Weakness present. No abnormal muscle tone.     Coordination: Coordination normal.     Gait: Gait normal.  Psychiatric:        Mood and Affect: Mood normal.        Behavior: Behavior normal.        Thought Content: Thought content normal.      Imaging: No results found.

## 2023-07-20 ENCOUNTER — Other Ambulatory Visit: Payer: Self-pay | Admitting: Internal Medicine

## 2023-07-20 DIAGNOSIS — E119 Type 2 diabetes mellitus without complications: Secondary | ICD-10-CM

## 2023-07-22 ENCOUNTER — Other Ambulatory Visit: Payer: Self-pay

## 2023-07-22 MED ORDER — MOUNJARO 15 MG/0.5ML ~~LOC~~ SOAJ
15.0000 mg | SUBCUTANEOUS | 0 refills | Status: DC
Start: 2023-07-22 — End: 2023-10-07
  Filled 2023-07-22: qty 6, 84d supply, fill #0

## 2023-07-24 ENCOUNTER — Encounter: Admitting: Physical Therapy

## 2023-07-27 ENCOUNTER — Ambulatory Visit: Admitting: Physical Therapy

## 2023-07-27 ENCOUNTER — Encounter: Admitting: Physical Therapy

## 2023-07-27 ENCOUNTER — Encounter: Payer: Self-pay | Admitting: Physical Therapy

## 2023-07-27 DIAGNOSIS — M25512 Pain in left shoulder: Secondary | ICD-10-CM | POA: Diagnosis not present

## 2023-07-27 DIAGNOSIS — M6281 Muscle weakness (generalized): Secondary | ICD-10-CM

## 2023-07-27 DIAGNOSIS — R293 Abnormal posture: Secondary | ICD-10-CM

## 2023-07-27 DIAGNOSIS — M25612 Stiffness of left shoulder, not elsewhere classified: Secondary | ICD-10-CM

## 2023-07-27 NOTE — Therapy (Signed)
 OUTPATIENT PHYSICAL THERAPY SHOULDER  Patient Name: Adam Ware, Dr. MRN: [id number] DOB:1950/11/18, 73 y.o., male Today's Date: 07/27/2023  END OF SESSION:  PT End of Session - 07/27/23 1536     Visit Number 2    Number of Visits 17    Date for PT Re-Evaluation 08/28/23    Authorization Type HTA    Authorization Time Period 07/03/23 to 08/28/23    PT Start Time 1430    PT Stop Time 1510    PT Time Calculation (min) 40 min    Activity Tolerance Patient tolerated treatment well    Behavior During Therapy [facility] for tasks assessed/performed              Past Medical History:  Diagnosis Date   Adrenal tumor    rigth   Arthritis    Cancer (HCC)    basal cell   Diabetes mellitus without complication (HCC)    type 2; tx metformin  and wkly ozempic  on Sundays   Hair loss    History of kidney stones    surgery to remove   Hyperlipidemia    tx w/atorvastatin    Hypertension    tx w/lisinopril    Insomnia    tx w/ambien    Past Surgical History:  Procedure Laterality Date   APPENDECTOMY     COLONOSCOPY     I & D KNEE WITH POLY EXCHANGE Right 01/24/2022   Procedure: IRRIGATION AND DEBRIDEMENT RIGHT KNEE WITH POLY EXCHANGE;  Surgeon: Timothy Ford, MD;  Location: [facility] OR;  Service: Orthopedics;  Laterality: Right;   IR URETERAL STENT LEFT NEW ACCESS W/O SEP NEPHROSTOMY CATH  04/13/2023   LITHOTRIPSY     MINOR CARPAL TUNNEL Left 11/07/2022   Procedure: MINOR CARPAL TUNNEL;  Surgeon: Merrill Abide, MD;  Location: [facility];  Service: Orthopedics;  Laterality: Left;  NEEDS RNFA (OPEN)   NEPHROLITHOTOMY Left 04/13/2023   Procedure: LEFT NEPHROLITHOTOMY PERCUTANEOUS;  Surgeon: Samson Croak, MD;  Location: [facility] ORS;  Service: Urology;  Laterality: Left;  150 MINUTE CASE   POSTERIOR LUMBAR FUSION 2 WITH HARDWARE REMOVAL Left 06/11/2023   Procedure: ARTHROSCOPY, SHOULDER WITH DEBRIDEMENT;  Surgeon: Jasmine Mesi, MD;  Location: [facility] OR;  Service: Orthopedics;   Laterality: Left;  LEFT SHOULDER ARTHROSCOPY, DEBRIDEMENT   REPLACEMENT TOTAL KNEE Right    SHOULDER OPEN ROTATOR CUFF REPAIR Left 06/11/2023   Procedure: REPAIR, ROTATOR CUFF, OPEN;  Surgeon: Jasmine Mesi, MD;  Location: [facility] OR;  Service: Orthopedics;  Laterality: Left;  BICEPS TENODESIS, MINI OPEN ROTATOR CUFF TEAR REPAIR WITH CUFFMEND AUGMENTATION   TOTAL KNEE REVISION Right 04/22/2017   Procedure: REVISION RIGHT TOTAL KNEE ARTHROPLASTY VS. 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   Encounter for general adult medical examination with abnormal findings 06/12/2021   Carpal tunnel syndrome, left upper limb 11/28/2020   Carpal tunnel syndrome, right upper limb 11/28/2020   Erectile dysfunction due to arterial insufficiency 09/24/2020  Benign prostatic hyperplasia with weak urinary stream 05/05/2018   Hyperlipidemia LDL goal <100 05/05/2018   Pure hyperglyceridemia 05/05/2018   Psychophysiological insomnia 05/05/2018   Lumbar radiculopathy 07/15/2017   Hyperlipidemia associated with type 2 diabetes mellitus (HCC) 06/10/2017   Essential hypertension 06/10/2017   Type 2 diabetes mellitus without complication, without long-term current use of insulin  (HCC) 06/10/2017   Spondylolisthesis, lumbar region 04/21/2017    PCP: Sandra Crouch MD   REFERRING PROVIDER: Jasmine Mesi, MD  REFERRING DIAG: Diagnosis [phone] (ICD-10-CM) - S/P rotator  cuff repair  THERAPY DIAG:  Muscle weakness (generalized)  Abnormal posture  Acute pain of left shoulder  Stiffness of left shoulder, not elsewhere classified  Rationale for Evaluation and Treatment: Rehabilitation  ONSET DATE: surgery 06/11/23  SUBJECTIVE:                                                                                                                                                                                      SUBJECTIVE STATEMENT: Pt arriving today reporting no pain at rest. Pt stating he has been putting using bilateral UE"s in pendulum motion. Pt also reported he attempted chipping and his discomfort increased on his 2nd attempt. Pt stating he stopped immediately.       Hand dominance: Left  PERTINENT HISTORY:  See above   PAIN:  Are you having pain? No  PRECAUTIONS:  left shoulder s/p open RCR on 06/11/23 PROM and isometric per MD referral   RED FLAGS: None   WEIGHT BEARING RESTRICTIONS: Yes NWB surgical shoulder   FALLS:  Has patient fallen in last 6 months? No  LIVING ENVIRONMENT: Lives with: lives alone Lives in: House/apartment  OCCUPATION: MD  PLOF: Independent, Independent with basic ADLs, Independent with gait, and Independent with transfers  PATIENT GOALS:  Get shoulder functional again    NEXT MD VISIT:   OBJECTIVE:  Note: Objective measures were completed at Evaluation unless otherwise noted.    PATIENT SURVEYS:    Patient-Specific Activity Scoring Scheme  "0" represents "unable to perform." "10" represents "able to perform at prior level. 0 1 2 3 4 5 6 7 8 9  10 (Date and Score)   Activity Eval     1. Washing hair   7    2. Reaching into opposite armpit   3    3. Personal hygiene  2   4.    5.    Score 4    Total score = sum of the activity scores/number of activities Minimum detectable change (90%CI) for average score = 2 points Minimum detectable change (90%CI) for single activity score = 3  points  COGNITION: Overall cognitive status: Within functional limits for tasks assessed       UPPER EXTREMITY ROM:   Passive ROM Right eval Left eval Left 07/27/23 Passive  supine  Shoulder flexion  110* 145  Shoulder extension     Shoulder scaption   100* 115  Shoulder adduction     Shoulder internal rotation  Hand to belly at 0* ABD    Shoulder external rotation  About 20* at 0* ABD 70 deg Shoulder abd 45 deg  Elbow flexion     Elbow extension     Wrist flexion     Wrist extension     Wrist ulnar deviation     Wrist radial deviation     Wrist pronation     Wrist supination     (Blank rows = not tested)  UPPER EXTREMITY MMT:  MMT Right eval Left eval  Shoulder flexion    Shoulder extension    Shoulder abduction    Shoulder adduction    Shoulder internal rotation    Shoulder external rotation    Middle trapezius    Lower trapezius    Elbow flexion    Elbow extension    Wrist flexion    Wrist extension    Wrist ulnar deviation    Wrist radial deviation    Wrist pronation    Wrist supination    Grip strength (lbs)    (Blank rows = not tested)  MMT not checked at eval due to surgical precautions     PALPATION:   Very tight in anterior shoulder musculature and into bicep                                                                                                                              TREATMENT DATE:  TherEx Pendulums: flexion/extension, circles both directions, abd/add each x 1-2 minutes Table slides: flexion, scaption and ER ( pt instructed to not stretch to point of pain) Seated cradled flexion x 10  Neuro Re-Ed Scapular retraction x 15 holding 5 sec Seated shoulder depression x 10  Self Care Pt edu and able to return demonstration of STM using tennis ball against the wall and while in supine for pressure point release for Rhomboids and levator scapulae Manual:  Scapular mobs PROM to left shoulder       07/03/23  Eval,  HEP, POC  Lots of education on maintaining post-op shoulder precautions and allowing soft tissues to heal, lots of education on why it is very beneficial for him to attend PT in PROM/isometric stage to prevent backsliding or even frozen shoulder   PROM for shoulder flexion/scaption/IR/ER as tolerated and appropriate    PATIENT EDUCATION: Education details: as above  Person educated: Patient Education method: Programmer, multimedia, Facilities manager, and Handouts Education comprehension: verbalized understanding, returned demonstration, and needs further education  HOME EXERCISE PROGRAM: Access Code: [email or web address] + isometrics from prior HEP (no ER isometrics yet) URL: [email or web address] Date: 07/03/2023 Prepared by:  Terrel Ferries  Exercises - Flexion-Extension Shoulder Pendulum with Table Support  - 2-3 x daily - 7 x weekly - 1 sets - 10 reps - Horizontal Shoulder Pendulum with Table Support  - 2-3 x daily - 7 x weekly - 1 sets - 10 reps - Circular Shoulder Pendulum with Table Support  - 2-3 x daily - 7 x weekly - 1 sets - 10 reps - Seated Scapular Retraction  - 2-3 x daily - 7 x weekly - 1 sets - 10 reps - Wrist AROM Flexion Extension  - 2-3 x daily - 7 x weekly - 1 sets - 10 reps - Seated Gripping Towel  - 2-3 x daily - 7 x weekly - 1 sets - 10 reps  ASSESSMENT:  CLINICAL IMPRESSION: 07/27/23: Pt is currently over 6.5 weeks post op rotator cuff repair. Pt with stiffness and muscular tightness with scapular mobility. Pt with good response to scapular mobs and PROM. Pt instructed to continue his current HEP c PROM as well as shoulder isometrics. Pt instructed to use tennis ball for self STM. Recommending continued skilled PT to maximize pt's function.   Eval:  Patient is a 73 y.o. M who was seen today for physical therapy evaluation and treatment for Diagnosis Z98.890 (ICD-10-CM) - S/P rotator cuff repair. Objectives as above. We will need to slow him down and make sure he is only  participating in activities appropriate for his stage of healing, suspect he may be moving a little quickly in terms of how much he is using his surgical UE at home (had to remind him of sling/passive ROM multiple times throughout eval). Had do to some education and convincing about 2x/week frequency in PROM stage. Anticipate he will do well with PT overall.   OBJECTIVE IMPAIRMENTS: decreased ROM, decreased strength, hypomobility, increased edema, increased fascial restrictions, increased muscle spasms, impaired flexibility, impaired UE functional use, improper body mechanics, postural dysfunction, and pain.   ACTIVITY LIMITATIONS: carrying, lifting, bathing, dressing, reach over head, hygiene/grooming, and caring for others  PARTICIPATION LIMITATIONS: meal prep, cleaning, laundry, driving, shopping, community activity, occupation, and yard work  PERSONAL FACTORS: Age, Behavior pattern, Education, Past/current experiences, Social background, and Time since onset of injury/illness/exacerbation are also affecting patient's functional outcome.   REHAB POTENTIAL: Good  CLINICAL DECISION MAKING: Stable/uncomplicated  EVALUATION COMPLEXITY: Low   GOALS: Goals reviewed with patient? No  SHORT TERM GOALS: Target date: 07/31/2023    Patient will be compliant with appropriate progressive HEP        GOAL STATUS: Initial  2. L shoulder flexion and ABD A/PROM to be at least 130*         GOAL STATUS: Initial    3. L shoulder IR and ER A/PROM to be at least 45* at 45* ABD away from trunk           GOAL STATUS: Initial    4. Will be more aware of posture with all functional tasks with use of ergonomic aides PRN/as desired      GOAL STATUS: Initial    LONG TERM GOALS: Target date: 08/28/2023    MMT to have improved by at least one grade in all weak groups       GOAL STATUS: Initial    2. AROM to have normalized and will be pain free all planes of motion      GOAL STATUS: Initial    3.  Pain to be no more than 2/10 with all functional tasks  GOAL STATUS: Initial   4. Will be able to perform all functional household and work based tasks without increase from resting pain levels     GOAL STATUS: Initial   5. PSFS  to have improved by at least 7 to show improved QOL and subjective perception of condition      GOAL STATUS: Initial   PLAN:  PT FREQUENCY: 2x/week  PT DURATION: 8 weeks  PLANNED INTERVENTIONS: 97750- Physical Performance Testing, 97110-Therapeutic exercises, 97530- Therapeutic activity, 97112- Neuromuscular re-education, 97535- Self Care, 16109- Manual therapy, 97016- Vasopneumatic device, Taping, and Dry Needling  PLAN FOR NEXT SESSION: PROM and isometrics only for now per MD order   Follow up with Dr. Rozelle Corning 08/06/23  Jerrel Mor, PT MPT 07/27/23 3:41 PM   07/27/23 3:41 PM

## 2023-07-31 ENCOUNTER — Ambulatory Visit: Admitting: Rehabilitative and Restorative Service Providers"

## 2023-07-31 ENCOUNTER — Encounter: Payer: Self-pay | Admitting: Rehabilitative and Restorative Service Providers"

## 2023-07-31 DIAGNOSIS — M25512 Pain in left shoulder: Secondary | ICD-10-CM | POA: Diagnosis not present

## 2023-07-31 DIAGNOSIS — M6281 Muscle weakness (generalized): Secondary | ICD-10-CM

## 2023-07-31 DIAGNOSIS — R293 Abnormal posture: Secondary | ICD-10-CM

## 2023-07-31 DIAGNOSIS — M25612 Stiffness of left shoulder, not elsewhere classified: Secondary | ICD-10-CM | POA: Diagnosis not present

## 2023-07-31 NOTE — Therapy (Signed)
 OUTPATIENT PHYSICAL THERAPY SHOULDER  Patient Name: Adam Ware, Dr. MRN: [id number] DOB:Jan 23, 1951, 73 y.o., male Today's Date: 07/31/2023  END OF SESSION:  PT End of Session - 07/31/23 1208     Visit Number 3    Number of Visits 17    Date for PT Re-Evaluation 08/28/23    Authorization Type HTA    Authorization Time Period 07/03/23 to 08/28/23    PT Start Time 1017    PT Stop Time 1100    PT Time Calculation (min) 43 min    Activity Tolerance Patient tolerated treatment well;No increased pain    Behavior During Therapy WFL for tasks assessed/performed            Past Medical History:  Diagnosis Date   Adrenal tumor    rigth   Arthritis    Cancer (HCC)    basal cell   Diabetes mellitus without complication (HCC)    type 2; tx metformin  and wkly ozempic  on Sundays   Hair loss    History of kidney stones    surgery to remove   Hyperlipidemia    tx w/atorvastatin    Hypertension    tx w/lisinopril    Insomnia    tx w/ambien    Past Surgical History:  Procedure Laterality Date   APPENDECTOMY     COLONOSCOPY     I & D KNEE WITH POLY EXCHANGE Right 01/24/2022   Procedure: IRRIGATION AND DEBRIDEMENT RIGHT KNEE WITH POLY EXCHANGE;  Surgeon: Timothy Ford, MD;  Location: [facility];  Service: Orthopedics;  Laterality: Right;   IR URETERAL STENT LEFT NEW ACCESS W/O SEP NEPHROSTOMY CATH  04/13/2023   LITHOTRIPSY     MINOR CARPAL TUNNEL Left 11/07/2022   Procedure: MINOR CARPAL TUNNEL;  Surgeon: Merrill Abide, MD;  Location: [facility];  Service: Orthopedics;  Laterality: Left;  NEEDS RNFA (OPEN)   NEPHROLITHOTOMY Left 04/13/2023   Procedure: LEFT NEPHROLITHOTOMY PERCUTANEOUS;  Surgeon: Samson Croak, MD;  Location: [facility];  Service: Urology;  Laterality: Left;  150 MINUTE CASE   POSTERIOR LUMBAR FUSION 2 WITH HARDWARE REMOVAL Left 06/11/2023   Procedure: ARTHROSCOPY, SHOULDER WITH DEBRIDEMENT;  Surgeon: Jasmine Mesi, MD;  Location: [facility] OR;  Service:  Orthopedics;  Laterality: Left;  LEFT SHOULDER ARTHROSCOPY, DEBRIDEMENT   REPLACEMENT TOTAL KNEE Right    SHOULDER OPEN ROTATOR CUFF REPAIR Left 06/11/2023   Procedure: REPAIR, ROTATOR CUFF, OPEN;  Surgeon: Jasmine Mesi, MD;  Location: [facility] OR;  Service: Orthopedics;  Laterality: Left;  BICEPS TENODESIS, MINI OPEN ROTATOR CUFF TEAR REPAIR WITH CUFFMEND AUGMENTATION   TOTAL KNEE REVISION Right 04/22/2017   Procedure: REVISION RIGHT TOTAL KNEE ARTHROPLASTY VS. 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   Encounter for general adult medical examination with abnormal findings 06/12/2021   Carpal tunnel syndrome, left upper limb 11/28/2020   Carpal tunnel syndrome, right upper limb 11/28/2020   Erectile dysfunction due to arterial insufficiency 09/24/2020  Benign prostatic hyperplasia with weak urinary stream 05/05/2018   Hyperlipidemia LDL goal <100 05/05/2018   Pure hyperglyceridemia 05/05/2018   Psychophysiological insomnia 05/05/2018   Lumbar radiculopathy 07/15/2017   Hyperlipidemia associated with type 2 diabetes mellitus (HCC) 06/10/2017   Essential hypertension 06/10/2017   Type 2 diabetes mellitus without complication, without long-term current use of insulin  (HCC) 06/10/2017   Spondylolisthesis, lumbar region 04/21/2017    PCP: Sandra Crouch MD   REFERRING PROVIDER: Jasmine Mesi, MD  REFERRING DIAG: Diagnosis [phone] (ICD-10-CM)  - S/P rotator cuff repair  THERAPY DIAG:  Muscle weakness (generalized)  Abnormal posture  Acute pain of left shoulder  Stiffness of left shoulder, not elsewhere classified  Rationale for Evaluation and Treatment: Rehabilitation  ONSET DATE: surgery 06/11/23  SUBJECTIVE:                                                                                                                                                                                      SUBJECTIVE STATEMENT: Zach reports sleep has been good.  He reports he has been using his left shoulder, although not aggressively.  I reviewed with Careem the importance of being conservative with left shoulder use, not letting it atrophy, but being very careful and trying to avoid reaching or overhead function, particularly with any external resistance.  Hand dominance: Left  PERTINENT HISTORY:  See above   PAIN:  Are you having pain? No  PRECAUTIONS:  left shoulder s/p open RCR on 06/11/23 PROM and isometric per MD referral   RED FLAGS: None   WEIGHT BEARING RESTRICTIONS: Yes NWB surgical shoulder   FALLS:  Has patient fallen in last 6 months? No  LIVING ENVIRONMENT: Lives with: lives alone Lives in: House/apartment  OCCUPATION: MD  PLOF: Independent, Independent with basic ADLs, Independent with gait, and Independent with transfers  PATIENT GOALS:  Get shoulder functional again    NEXT MD VISIT:   OBJECTIVE:  Note: Objective measures were completed at Evaluation unless otherwise noted.    PATIENT SURVEYS:    Patient-Specific Activity Scoring Scheme  0 represents "unable to perform." 10 represents "able to perform at prior level. 0 1 2 3 4 5 6 7 8 9  10 (Date and Score)   Activity Eval     1. Washing hair   7    2. Reaching into opposite armpit   3    3. Personal hygiene  2   4.    5.    Score 4    Total score = sum of the activity scores/number of activities Minimum detectable change (90%CI)  for average score = 2 points Minimum detectable change (90%CI) for  single activity score = 3 points     COGNITION: Overall cognitive status: Within functional limits for tasks assessed       UPPER EXTREMITY ROM:   Passive ROM Right eval Left eval Left 07/27/23 Passive  supine Left 07/31/2023  Shoulder flexion  110* 145 145  Shoulder extension      Shoulder scaption   100* 115   Shoulder adduction      Shoulder internal rotation  Hand to belly at 0* ABD   40 degrees at 70 degrees abduction  Shoulder external rotation  About 20* at 0* ABD 70 deg Shoulder abd 45 deg 80 degrees at 70 degrees abduction  Elbow flexion      Elbow extension      Wrist flexion      Wrist extension      Wrist ulnar deviation      Wrist radial deviation      Wrist pronation      Wrist supination      (Blank rows = not tested)  UPPER EXTREMITY MMT:  MMT Right eval Left eval  Shoulder flexion    Shoulder extension    Shoulder abduction    Shoulder adduction    Shoulder internal rotation    Shoulder external rotation    Middle trapezius    Lower trapezius    Elbow flexion    Elbow extension    Wrist flexion    Wrist extension    Wrist ulnar deviation    Wrist radial deviation    Wrist pronation    Wrist supination    Grip strength (lbs)    (Blank rows = not tested)  MMT not checked at eval due to surgical precautions     PALPATION:   Very tight in anterior shoulder musculature and into bicep                                                                                                                              TREATMENT DATE:  07/31/2023 PROM IR/ER with PT for 20 minutes Shoulder IR stretch 20 x 10 seconds  Functional Activities: Shoulder blade pinches/scapular retraction 10 x 5 seconds (Posture and for better scapular positioning for reaching and overhead function) Supine arm raises/scapular protraction 20 x 3 seconds (for reaching and overhead function, OK to add 1# at  home)   07/27/2023 TherEx Pendulums: flexion/extension, circles both directions, abd/add each x 1-2 minutes Table slides: flexion, scaption and ER ( pt instructed to not stretch to point of pain) Seated cradled flexion x 10  Neuro Re-Ed Scapular retraction x 15 holding 5 sec Seated shoulder depression x 10  Self Care Pt edu and able to return demonstration of STM using tennis ball against the wall and while in supine for pressure point release for Rhomboids and levator scapulae Manual:  Scapular mobs PROM to left shoulder    07/03/23  Eval, HEP, POC  Lots of education on maintaining post-op shoulder  precautions and allowing soft tissues to heal, lots of education on why it is very beneficial for him to attend PT in PROM/isometric stage to prevent backsliding or even frozen shoulder   PROM for shoulder flexion/scaption/IR/ER as tolerated and appropriate    PATIENT EDUCATION: Education details: as above  Person educated: Patient Education method: Programmer, multimedia, Demonstration, and Handouts Education comprehension: verbalized understanding, returned demonstration, and needs further education  HOME EXERCISE PROGRAM: Access Code: [email or web address] URL: [email or web address] Date: 07/31/2023 Prepared by: Terral Ferrari  Exercises - Flexion-Extension Shoulder Pendulum with Table Support  - 2-3 x daily - 7 x weekly - 1 sets - 10 reps - Horizontal Shoulder Pendulum with Table Support  - 2-3 x daily - 7 x weekly - 1 sets - 10 reps - Circular Shoulder Pendulum with Table Support  - 2-3 x daily - 7 x weekly - 1 sets - 10 reps - Seated Scapular Retraction  - 5 x daily - 7 x weekly - 1 sets - 5 reps - 5 seconds hold - Wrist AROM Flexion Extension  - 2-3 x daily - 7 x weekly - 1 sets - 10 reps - Seated Gripping Towel  - 2-3 x daily - 7 x weekly - 1 sets - 10 reps - Supine Shoulder Internal Rotation  - 2 x daily - 7 x weekly - 1 sets - 10 reps - 10 seconds hold - Supine Scapular  Protraction in Flexion with Dumbbells  - 2 x daily - 7 x weekly - 1 sets - 20 reps - 3 seconds hold  ASSESSMENT:  CLINICAL IMPRESSION: Klyde has very good active range of motion at the start of week 7 post-surgery.  We added some scapular strengthening activities and an internal rotation stretch to allow Brennin to function more normally and prepare for eventual rotator cuff strength work when appropriate.  Kaamil and I spent time reviewing the importance of not being afraid to use left shoulder, but being very careful, particularly with reaching overhead function with external resistance.  Kyrus had a good understanding of this and appears to be well on his way to meet long-term goals.  Eval:  Patient is a 73 y.o. M who was seen today for physical therapy evaluation and treatment for Diagnosis Z98.890 (ICD-10-CM) - S/P rotator cuff repair. Objectives as above. We will need to slow him down and make sure he is only participating in activities appropriate for his stage of healing, suspect he may be moving a little quickly in terms of how much he is using his surgical UE at home (had to remind him of sling/passive ROM multiple times throughout eval). Had do to some education and convincing about 2x/week frequency in PROM stage. Anticipate he will do well with PT overall.   OBJECTIVE IMPAIRMENTS: decreased ROM, decreased strength, hypomobility, increased edema, increased fascial restrictions, increased muscle spasms, impaired flexibility, impaired UE functional use, improper body mechanics, postural dysfunction, and pain.   ACTIVITY LIMITATIONS: carrying, lifting, bathing, dressing, reach over head, hygiene/grooming, and caring for others  PARTICIPATION LIMITATIONS: meal prep, cleaning, laundry, driving, shopping, community activity, occupation, and yard work  PERSONAL FACTORS: Age, Behavior pattern, Education, Past/current experiences, Social background, and Time since onset of injury/illness/exacerbation are  also affecting patient's functional outcome.   REHAB POTENTIAL: Good  CLINICAL DECISION MAKING: Stable/uncomplicated  EVALUATION COMPLEXITY: Low   GOALS: Goals reviewed with patient? No  SHORT TERM GOALS: Target date: 07/31/2023    Patient will be compliant with appropriate progressive HEP  GOAL STATUS: Met 07/31/2023  2. L shoulder flexion and ABD A/PROM to be at least 130*         GOAL STATUS: Met 07/31/2023   3. L shoulder IR and ER A/PROM to be at least 45* at 45* ABD away from trunk           GOAL STATUS: Met 07/31/2023   4. Will be more aware of posture with all functional tasks with use of ergonomic aides PRN/as desired      GOAL STATUS: Ongoing 07/31/2023   LONG TERM GOALS: Target date: 08/28/2023    MMT to have improved by at least one grade in all weak groups       GOAL STATUS: Initial    2. AROM to have normalized and will be pain free all planes of motion      GOAL STATUS: Initial    3. Pain to be no more than 2/10 with all functional tasks     GOAL STATUS: Initial   4. Will be able to perform all functional household and work based tasks without increase from resting pain levels     GOAL STATUS: Initial   5. PSFS  to have improved by at least 7 to show improved QOL and subjective perception of condition      GOAL STATUS: Initial   PLAN:  PT FREQUENCY: 2x/week  PT DURATION: 8 weeks  PLANNED INTERVENTIONS: 97750- Physical Performance Testing, 97110-Therapeutic exercises, 97530- Therapeutic activity, [id number]- Neuromuscular re-education, 97535- Self Care, 40981- Manual therapy, 97016- Vasopneumatic device, Taping, and Dry Needling  PLAN FOR NEXT SESSION: Sees Dr. Rozelle Corning 08/03/2023.  Likely AROM and appropriate scapular strength progressions while being very conservative with resisted rotator cuff strengthening.   Joli Neas, PT MPT 07/31/23 12:21 PM   07/31/23 12:21 PM

## 2023-08-03 ENCOUNTER — Ambulatory Visit (INDEPENDENT_AMBULATORY_CARE_PROVIDER_SITE_OTHER): Admitting: Orthopedic Surgery

## 2023-08-03 DIAGNOSIS — Z9889 Other specified postprocedural states: Secondary | ICD-10-CM

## 2023-08-04 ENCOUNTER — Encounter: Payer: Self-pay | Admitting: Orthopedic Surgery

## 2023-08-04 NOTE — Progress Notes (Signed)
 Post-Op Visit Note   Patient: Adam Ware, Dr.           Date of Birth: [birth year]           MRN: [id number] Visit Date: 08/03/2023 PCP: Adam Knuckles, MD   Assessment & Plan:  Chief Complaint:  Chief Complaint  Patient presents with   Left Shoulder - Routine Post Op      06/11/23    Arthroscopy, Shoulder With Debridement - Left      Repair, Rotator Cuff, Open - Left       Visit Diagnoses:  1. S/P rotator cuff repair     Plan: Adam Ware is now 7 weeks out left shoulder arthroscopy with rotator cuff repair of 2 rotator cuff tears.  Complex tear pattern.  Doing home exercises on his own.  Starting a little bit of right shoulder pain as well.  Did have a subacromial injection about 2 months ago on the right-hand side.  Overall his hand may be making slow improvement.  He is concerned about his right total knee replacement as well.  On examination he has range of motion of 80/100/175 with improving rotator cuff strength and no coarseness with passive range of motion.  Good range of motion with no coarseness on the right-hand side.  Localizes pain on the right-hand side to the biceps tendon which could be significant based on the size and pathology involved on the left-hand side without tendon.  Plan at this time is okay for discharge strengthening exercises.  Do not really want him swinging golf clubs yet but I think it is okay for him to do putting as well as chipping from just off the green on 2 degree.  Follow-up in 6 weeks for clinical recheck.  He is not having pain but in general he does not have a lot of pain with any of his orthopedic issues.  Follow-Up Instructions: No follow-ups on file.   Orders:  No orders of the defined types were placed in this encounter.  No orders of the defined types were placed in this encounter.   Imaging: No results found.  PMFS History: Patient Active Problem List   Diagnosis Date Noted   Synovitis of left shoulder 06/13/2023   Degenerative  superior labral anterior-to-posterior (SLAP) tear of left shoulder 06/13/2023   Nontraumatic complete tear of left rotator cuff 06/13/2023   Biceps tendonitis on left 06/13/2023   Renal calculi 04/13/2023   Gross hematuria 02/13/2023   Acute prostatitis 02/13/2023   Right adrenal mass (HCC) 02/13/2023   Left renal stone 02/13/2023   High serum lipoprotein(a) 09/05/2022   Encounter for general adult medical examination with abnormal findings 06/12/2021   Carpal tunnel syndrome, left upper limb 11/28/2020   Carpal tunnel syndrome, right upper limb 11/28/2020   Erectile dysfunction due to arterial insufficiency 09/24/2020   Benign prostatic hyperplasia with weak urinary stream 05/05/2018   Hyperlipidemia LDL goal <100 05/05/2018   Pure hyperglyceridemia 05/05/2018   Psychophysiological insomnia 05/05/2018   Lumbar radiculopathy 07/15/2017   Hyperlipidemia associated with type 2 diabetes mellitus (HCC) 06/10/2017   Essential hypertension 06/10/2017   Type 2 diabetes mellitus without complication, without long-term current use of insulin  (HCC) 06/10/2017   Spondylolisthesis, lumbar region 04/21/2017   Past Medical History:  Diagnosis Date   Adrenal tumor    rigth   Arthritis    Cancer (HCC)    basal cell   Diabetes mellitus without complication (HCC)    type 2; tx  metformin  and wkly ozempic  on Sundays   Hair loss    History of kidney stones    surgery to remove   Hyperlipidemia    tx w/atorvastatin    Hypertension    tx w/lisinopril    Insomnia    tx w/ambien     Family History  Problem Relation Age of Onset   Cancer Mother    Diabetes Mother    Mental illness Mother    Heart disease Father    Arthritis Brother     Past Surgical History:  Procedure Laterality Date   APPENDECTOMY     COLONOSCOPY     I & D KNEE WITH POLY EXCHANGE Right 01/24/2022   Procedure: IRRIGATION AND DEBRIDEMENT RIGHT KNEE WITH POLY EXCHANGE;  Surgeon: Timothy Ford, MD;  Location: [facility];  Service:  Orthopedics;  Laterality: Right;   IR URETERAL STENT LEFT NEW ACCESS W/O SEP NEPHROSTOMY CATH  04/13/2023   LITHOTRIPSY     MINOR CARPAL TUNNEL Left 11/07/2022   Procedure: MINOR CARPAL TUNNEL;  Surgeon: Merrill Abide, MD;  Location: [facility];  Service: Orthopedics;  Laterality: Left;  NEEDS RNFA (OPEN)   NEPHROLITHOTOMY Left 04/13/2023   Procedure: LEFT NEPHROLITHOTOMY PERCUTANEOUS;  Surgeon: Samson Croak, MD;  Location: [facility];  Service: Urology;  Laterality: Left;  150 MINUTE CASE   POSTERIOR LUMBAR FUSION 2 WITH HARDWARE REMOVAL Left 06/11/2023   Procedure: ARTHROSCOPY, SHOULDER WITH DEBRIDEMENT;  Surgeon: Jasmine Mesi, MD;  Location: [facility] OR;  Service: Orthopedics;  Laterality: Left;  LEFT SHOULDER ARTHROSCOPY, DEBRIDEMENT   REPLACEMENT TOTAL KNEE Right    SHOULDER OPEN ROTATOR CUFF REPAIR Left 06/11/2023   Procedure: REPAIR, ROTATOR CUFF, OPEN;  Surgeon: Jasmine Mesi, MD;  Location: [facility] OR;  Service: Orthopedics;  Laterality: Left;  BICEPS TENODESIS, MINI OPEN ROTATOR CUFF TEAR REPAIR WITH CUFFMEND AUGMENTATION   TOTAL KNEE REVISION Right 04/22/2017   Procedure: REVISION RIGHT TOTAL KNEE ARTHROPLASTY VS. POLY EXCHANGE;  Surgeon: Timothy Ford, MD;  Location: [facility];  Service: Orthopedics;  Laterality: Right;   Social History   Occupational History   Occupation: Part time  Tobacco Use   Smoking status: Never    Passive exposure: Never   Smokeless tobacco: Never  Vaping Use   Vaping status: Never Used  Substance and Sexual Activity   Alcohol use: Yes    Alcohol/week: 1.0 standard drink of alcohol    Types: 1 Glasses of wine per week    Comment: wine daily   Drug use: No   Sexual activity: Not Currently    Partners: Male

## 2023-08-10 ENCOUNTER — Encounter: Payer: Self-pay | Admitting: Rehabilitative and Restorative Service Providers"

## 2023-08-10 ENCOUNTER — Ambulatory Visit: Admitting: Rehabilitative and Restorative Service Providers"

## 2023-08-10 DIAGNOSIS — M25512 Pain in left shoulder: Secondary | ICD-10-CM

## 2023-08-10 DIAGNOSIS — R293 Abnormal posture: Secondary | ICD-10-CM | POA: Diagnosis not present

## 2023-08-10 DIAGNOSIS — M6281 Muscle weakness (generalized): Secondary | ICD-10-CM | POA: Diagnosis not present

## 2023-08-10 DIAGNOSIS — M25612 Stiffness of left shoulder, not elsewhere classified: Secondary | ICD-10-CM | POA: Diagnosis not present

## 2023-08-10 NOTE — Therapy (Addendum)
 OUTPATIENT PHYSICAL THERAPY TREATMENT  Patient Name: Adam Ware, Dr. 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 DOB:1951-01-24, 73 y.o., male Today's Date: 08/10/2023  END OF SESSION:  PT End of Session - 08/10/23 1441     Visit Number 4    Number of Visits 17    Date for PT Re-Evaluation 08/28/23    Authorization Type HTA    Authorization Time Period 07/03/23 to 08/28/23    PT Start Time 1429    PT Stop Time 1507    PT Time Calculation (min) 38 min    Activity Tolerance Patient tolerated treatment well;No increased pain    Behavior During Therapy WFL for tasks assessed/performed             Past Medical History:  Diagnosis Date   Adrenal tumor    rigth   Arthritis    Cancer (HCC)    basal cell   Diabetes mellitus without complication (HCC)    type 2; tx metformin  and wkly ozempic  on Sundays   Hair loss    History of kidney stones    surgery to remove   Hyperlipidemia    tx w/atorvastatin    Hypertension    tx w/lisinopril    Insomnia    tx w/ambien    Past Surgical History:  Procedure Laterality Date   APPENDECTOMY     COLONOSCOPY     I & D KNEE WITH POLY EXCHANGE Right 01/24/2022   Procedure: IRRIGATION AND DEBRIDEMENT RIGHT KNEE WITH POLY EXCHANGE;  Surgeon: Harden Jerona GAILS, MD;  Location: [facility] OR;  Service: Orthopedics;  Laterality: Right;   IR URETERAL STENT LEFT NEW ACCESS W/O SEP NEPHROSTOMY CATH  04/13/2023   LITHOTRIPSY     MINOR CARPAL TUNNEL Left 11/07/2022   Procedure: MINOR CARPAL TUNNEL;  Surgeon: Arlinda Buster, MD;  Location: [facility];  Service: Orthopedics;  Laterality: Left;  NEEDS RNFA (OPEN)   NEPHROLITHOTOMY Left 04/13/2023   Procedure: LEFT NEPHROLITHOTOMY PERCUTANEOUS;  Surgeon: Carolee Sherwood JONETTA DOUGLAS, MD;  Location: [facility];  Service: Urology;  Laterality: Left;  150 MINUTE CASE   POSTERIOR LUMBAR FUSION 2 WITH HARDWARE REMOVAL Left 06/11/2023   Procedure: ARTHROSCOPY, SHOULDER WITH DEBRIDEMENT;  Surgeon: Addie Cordella Hamilton, MD;  Location: [facility] OR;   Service: Orthopedics;  Laterality: Left;  LEFT SHOULDER ARTHROSCOPY, DEBRIDEMENT   REPLACEMENT TOTAL KNEE Right    SHOULDER OPEN ROTATOR CUFF REPAIR Left 06/11/2023   Procedure: REPAIR, ROTATOR CUFF, OPEN;  Surgeon: Addie Cordella Hamilton, MD;  Location: [facility] OR;  Service: Orthopedics;  Laterality: Left;  BICEPS TENODESIS, MINI OPEN ROTATOR CUFF TEAR REPAIR WITH CUFFMEND AUGMENTATION   TOTAL KNEE REVISION Right 04/22/2017   Procedure: REVISION RIGHT TOTAL KNEE ARTHROPLASTY VS. POLY EXCHANGE;  Surgeon: Harden Jerona GAILS, MD;  Location: [facility] OR;  Service: Orthopedics;  Laterality: Right;   Patient Active Problem List   Diagnosis Date Noted   Synovitis of left shoulder 06/13/2023   Degenerative superior labral anterior-to-posterior (SLAP) tear of left shoulder 06/13/2023   Nontraumatic complete tear of left rotator cuff 06/13/2023   Biceps tendonitis on left 06/13/2023   Renal calculi 04/13/2023   Gross hematuria 02/13/2023   Acute prostatitis 02/13/2023   Right adrenal mass (HCC) 02/13/2023   Left renal stone 02/13/2023   High serum lipoprotein(a) 09/05/2022   Encounter for general adult medical examination with abnormal findings 06/12/2021   Carpal tunnel syndrome, left upper limb 11/28/2020   Carpal tunnel syndrome, right upper limb 11/28/2020   Erectile dysfunction due to arterial insufficiency 09/24/2020  Benign prostatic hyperplasia with weak urinary stream 05/05/2018   Hyperlipidemia LDL goal <100 05/05/2018   Pure hyperglyceridemia 05/05/2018   Psychophysiological insomnia 05/05/2018   Lumbar radiculopathy 07/15/2017   Hyperlipidemia associated with type 2 diabetes mellitus (HCC) 06/10/2017   Essential hypertension 06/10/2017   Type 2 diabetes mellitus without complication, without long-term current use of insulin  (HCC) 06/10/2017   Spondylolisthesis, lumbar region 04/21/2017    PCP: Joshua Ned MD   REFERRING PROVIDER: Addie Cordella Hamilton, MD  REFERRING DIAG: Diagnosis [phone]  (ICD-10-CM) - S/P rotator cuff repair  THERAPY DIAG:  Muscle weakness (generalized)  Acute pain of left shoulder  Abnormal posture  Stiffness of left shoulder, not elsewhere classified  Rationale for Evaluation and Treatment: Rehabilitation  ONSET DATE: surgery 06/11/23  SUBJECTIVE:                                                                                                                                                                                      SUBJECTIVE STATEMENT: Pt indicated Lt shoulder feeling better.  Reported having some trouble with Rt.   Reported   Hand dominance: Left  PERTINENT HISTORY:  See above   PAIN:   NPRS scale: 0/10 Pain location: Lt shoulder Pain description:  Aggravating factors:  Relieving factors:    PRECAUTIONS:  left shoulder s/p open RCR on 06/11/23 PROM and isometric per MD referral   RED FLAGS: None   WEIGHT BEARING RESTRICTIONS: Yes NWB surgical shoulder   FALLS:  Has patient fallen in last 6 months? No  LIVING ENVIRONMENT: Lives with: lives alone Lives in: House/apartment  OCCUPATION: MD  PLOF: Independent, Independent with basic ADLs, Independent with gait, and Independent with transfers  PATIENT GOALS:  Get shoulder functional again    NEXT MD VISIT:   OBJECTIVE:  Note: Objective measures were completed at Evaluation unless otherwise noted.  PATIENT SURVEYS:   Patient-Specific Activity Scoring Scheme  0 represents "unable to perform." 10 represents "able to perform at prior level. 0 1 2 3 4 5 6 7 8 9  10 (Date and Score)   Activity Eval  07/03/2023    1. Washing hair   7    2. Reaching into opposite armpit   3    3. Personal hygiene  2   4.    5.    Score 4    Total score = sum of the activity scores/number of activities Minimum detectable change (90%CI) for average score = 2 points Minimum detectable change (90%CI) for single activity score = 3 points   COGNITION: 07/03/2023 Overall  cognitive status: Within functional limits for tasks assessed       UPPER  EXTREMITY ROM:   Passive ROM Right eval Left Eval 07/03/2023 Left 07/27/23 Passive  supine Left 07/31/2023 Left 08/10/2023  Shoulder flexion  110* 145 145 WFL s shrug  Shoulder extension       Shoulder scaption   100* 115    Shoulder adduction       Shoulder internal rotation  Hand to belly at 0* ABD   40 degrees at 70 degrees abduction   Shoulder external rotation  About 20* at 0* ABD 70 deg Shoulder abd 45 deg 80 degrees at 70 degrees abduction   Elbow flexion       Elbow extension       Wrist flexion       Wrist extension       Wrist ulnar deviation       Wrist radial deviation       Wrist pronation       Wrist supination       (Blank rows = not tested)  UPPER EXTREMITY MMT:  MMT Right eval Left eval  Shoulder flexion    Shoulder extension    Shoulder abduction    Shoulder adduction    Shoulder internal rotation    Shoulder external rotation    Middle trapezius    Lower trapezius    Elbow flexion    Elbow extension    Wrist flexion    Wrist extension    Wrist ulnar deviation    Wrist radial deviation    Wrist pronation    Wrist supination    Grip strength (lbs)    (Blank rows = not tested)  MMT not checked at eval due to surgical precautions     PALPATION:  07/03/2023 Very tight in anterior shoulder musculature and into bicep                  TREATMENT         DATE:  08/10/2023 Therex Pulley flexion, scaption x 5 each Lt UE Strap stretch Lt hand behind back 15 sec x 3  Cross arm stretch 15 sec x 3  Time spent in education of new exercise progressions.    Neuro Re-ed Standing blue band rows c scapular retraction focus bilateral 2 x 15 Standing blue band GH ext to legs 2 x 15  Standing Lt shoulder 100 deg flexion 2 lb ball at wall cw, ccw circles 2 x 30 each way                                                                                                                               TREATMENT         DATE: 07/31/2023 PROM IR/ER with PT for 20 minutes Shoulder IR stretch 20 x 10 seconds  Functional Activities: Shoulder blade pinches/scapular retraction 10 x 5 seconds (Posture and for better scapular positioning for reaching and overhead function) Supine arm raises/scapular protraction 20 x 3 seconds (for reaching and overhead function,  OK to add 1# at home)   TREATMENT         DATE: 07/27/2023 TherEx Pendulums: flexion/extension, circles both directions, abd/add each x 1-2 minutes Table slides: flexion, scaption and ER ( pt instructed to not stretch to point of pain) Seated cradled flexion x 10  Neuro Re-Ed Scapular retraction x 15 holding 5 sec Seated shoulder depression x 10  Self Care Pt edu and able to return demonstration of STM using tennis ball against the wall and while in supine for pressure point release for Rhomboids and levator scapulae Manual:  Scapular mobs PROM to left shoulder    07/03/23  Eval, HEP, POC  Lots of education on maintaining post-op shoulder precautions and allowing soft tissues to heal, lots of education on why it is very beneficial for him to attend PT in PROM/isometric stage to prevent backsliding or even frozen shoulder   PROM for shoulder flexion/scaption/IR/ER as tolerated and appropriate    PATIENT EDUCATION: Education details: as above  Person educated: Patient Education method: Programmer, multimedia, Facilities manager, and Handouts Education comprehension: verbalized understanding, returned demonstration, and needs further education  HOME EXERCISE PROGRAM: Access Code: [email or web address] URL: [email or web address] Date: 08/10/2023 Prepared by: Ozell Silvan  Exercises - Seated Scapular Retraction  - 5 x daily - 7 x weekly - 1 sets - 5 reps - 5 seconds hold - Supine Shoulder Internal Rotation (Mirrored)  - 2 x daily - 7 x weekly - 1 sets - 10 reps - 10 seconds hold - Supine Scapular Protraction in Flexion with  Dumbbells  - 2 x daily - 7 x weekly - 1 sets - 20 reps - 3 seconds hold - Standing Shoulder Internal Rotation Stretch with Towel (Mirrored)  - 1-2 x daily - 7 x weekly - 1 sets - 3-5 reps - 15-20 hold - Standing Shoulder Posterior Capsule Stretch  - 1-2 x daily - 7 x weekly - 1 sets - 5 reps - 15-30 hold - Standing Bilateral Low Shoulder Row with Anchored Resistance  - 1-2 x daily - 7 x weekly - 2-3 sets - 10-15 reps - Shoulder Extension with Resistance  - 1-2 x daily - 7 x weekly - 2-3 sets - 10-15 reps - Shoulder External Rotation Reactive Isometrics  - 1 x daily - 7 x weekly - 1 sets - 10 reps - 5-15 hold  ASSESSMENT:  CLINICAL IMPRESSION: Good functional overhead reach noted without weight.  Continued strengthening and movement coordination movements indicated to continue functional progression.   OBJECTIVE IMPAIRMENTS: decreased ROM, decreased strength, hypomobility, increased edema, increased fascial restrictions, increased muscle spasms, impaired flexibility, impaired UE functional use, improper body mechanics, postural dysfunction, and pain.   ACTIVITY LIMITATIONS: carrying, lifting, bathing, dressing, reach over head, hygiene/grooming, and caring for others  PARTICIPATION LIMITATIONS: meal prep, cleaning, laundry, driving, shopping, community activity, occupation, and yard work  PERSONAL FACTORS: Age, Behavior pattern, Education, Past/current experiences, Social background, and Time since onset of injury/illness/exacerbation are also affecting patient's functional outcome.   REHAB POTENTIAL: Good  CLINICAL DECISION MAKING: Stable/uncomplicated  EVALUATION COMPLEXITY: Low   GOALS: Goals reviewed with patient? No  SHORT TERM GOALS: Target date: 07/31/2023    Patient will be compliant with appropriate progressive HEP        GOAL STATUS: Met 07/31/2023  2. Lt shoulder flexion and ABD A/PROM to be at least 130*         GOAL STATUS: Met 07/31/2023   3. Lt shoulder IR and ER  A/PROM to be at  least 45* at 45* ABD away from trunk           GOAL STATUS: Met 07/31/2023   4. Will be more aware of posture with all functional tasks with use of ergonomic aides PRN/as desired      GOAL STATUS: Ongoing 07/31/2023   LONG TERM GOALS: Target date: 08/28/2023    MMT to have improved by at least one grade in all weak groups       GOAL STATUS: Initial    2. AROM to have normalized and will be pain free all planes of motion      GOAL STATUS: Initial    3. Pain to be no more than 2/10 with all functional tasks     GOAL STATUS: Initial   4. Will be able to perform all functional household and work based tasks without increase from resting pain levels     GOAL STATUS: Initial   5. PSFS  to have improved by at least 7 to show improved QOL and subjective perception of condition      GOAL STATUS: Initial   PLAN:  PT FREQUENCY: 2x/week  PT DURATION: 8 weeks  PLANNED INTERVENTIONS: 97750- Physical Performance Testing, 97110-Therapeutic exercises, 97530- Therapeutic activity, 97112- Neuromuscular re-education, 97535- Self Care, 02859- Manual therapy, 97016- Vasopneumatic device, Taping, and Dry Needling  PLAN FOR NEXT SESSION: MD release for strengthening, plan to continue to progress as able.  Check strength formally.    Ozell Silvan, PT, DPT, OCS, ATC 08/10/23  3:11 PM

## 2023-08-14 ENCOUNTER — Other Ambulatory Visit: Payer: Self-pay

## 2023-08-17 ENCOUNTER — Ambulatory Visit: Payer: Self-pay | Admitting: Internal Medicine

## 2023-08-17 ENCOUNTER — Encounter: Payer: Self-pay | Admitting: Internal Medicine

## 2023-08-17 ENCOUNTER — Ambulatory Visit (INDEPENDENT_AMBULATORY_CARE_PROVIDER_SITE_OTHER): Payer: PPO | Admitting: Internal Medicine

## 2023-08-17 ENCOUNTER — Other Ambulatory Visit (HOSPITAL_COMMUNITY): Payer: Self-pay

## 2023-08-17 VITALS — BP 130/68 | HR 73 | Temp 98.6°F | Resp 16 | Wt 194.0 lb

## 2023-08-17 DIAGNOSIS — Z0000 Encounter for general adult medical examination without abnormal findings: Secondary | ICD-10-CM

## 2023-08-17 DIAGNOSIS — Z0001 Encounter for general adult medical examination with abnormal findings: Secondary | ICD-10-CM

## 2023-08-17 DIAGNOSIS — E781 Pure hyperglyceridemia: Secondary | ICD-10-CM | POA: Diagnosis not present

## 2023-08-17 DIAGNOSIS — I10 Essential (primary) hypertension: Secondary | ICD-10-CM

## 2023-08-17 DIAGNOSIS — E785 Hyperlipidemia, unspecified: Secondary | ICD-10-CM

## 2023-08-17 DIAGNOSIS — D539 Nutritional anemia, unspecified: Secondary | ICD-10-CM | POA: Diagnosis not present

## 2023-08-17 DIAGNOSIS — E119 Type 2 diabetes mellitus without complications: Secondary | ICD-10-CM

## 2023-08-17 LAB — CBC WITH DIFFERENTIAL/PLATELET
Basophils Absolute: 0 10*3/uL (ref 0.0–0.1)
Basophils Relative: 0.5 % (ref 0.0–3.0)
Eosinophils Absolute: 0.1 10*3/uL (ref 0.0–0.7)
Eosinophils Relative: 1.6 % (ref 0.0–5.0)
HCT: 38.2 % — ABNORMAL LOW (ref 39.0–52.0)
Hemoglobin: 12.6 g/dL — ABNORMAL LOW (ref 13.0–17.0)
Lymphocytes Relative: 42.6 % (ref 12.0–46.0)
Lymphs Abs: 3.1 10*3/uL (ref 0.7–4.0)
MCHC: 32.9 g/dL (ref 30.0–36.0)
MCV: 85.8 fl (ref 78.0–100.0)
Monocytes Absolute: 0.5 10*3/uL (ref 0.1–1.0)
Monocytes Relative: 7.5 % (ref 3.0–12.0)
Neutro Abs: 3.5 10*3/uL (ref 1.4–7.7)
Neutrophils Relative %: 47.8 % (ref 43.0–77.0)
Platelets: 259 10*3/uL (ref 150.0–400.0)
RBC: 4.45 Mil/uL (ref 4.22–5.81)
RDW: 15.6 % — ABNORMAL HIGH (ref 11.5–15.5)
WBC: 7.3 10*3/uL (ref 4.0–10.5)

## 2023-08-17 LAB — URINALYSIS, ROUTINE W REFLEX MICROSCOPIC
Bilirubin Urine: NEGATIVE
Hgb urine dipstick: NEGATIVE
Ketones, ur: NEGATIVE
Leukocytes,Ua: NEGATIVE
Nitrite: NEGATIVE
Specific Gravity, Urine: >=1.03 — AB (ref 1.000–1.030)
Total Protein, Urine: NEGATIVE
Urine Glucose: 100 — AB
Urobilinogen, UA: 0.2 (ref 0.0–1.0)
pH: 6 (ref 5.0–8.0)

## 2023-08-17 LAB — BASIC METABOLIC PANEL WITH GFR
BUN: 13 mg/dL (ref 6–23)
CO2: 28 meq/L (ref 19–32)
Calcium: 8.9 mg/dL (ref 8.4–10.5)
Chloride: 104 meq/L (ref 96–112)
Creatinine, Ser: 0.61 mg/dL (ref 0.40–1.50)
GFR: 94.15 mL/min (ref >60.00)
Glucose, Bld: 94 mg/dL (ref 70–99)
Potassium: 3.9 meq/L (ref 3.5–5.1)
Sodium: 139 meq/L (ref 135–145)

## 2023-08-17 LAB — HEMOGLOBIN A1C: Hgb A1c MFr Bld: 6 % (ref 4.6–6.5)

## 2023-08-17 LAB — LIPID PANEL
Cholesterol: 121 mg/dL (ref 0–200)
HDL: 33.9 mg/dL — ABNORMAL LOW (ref >39.00)
LDL Cholesterol: 58 mg/dL (ref 0–99)
NonHDL: 87.1
Total CHOL/HDL Ratio: 4
Triglycerides: 146 mg/dL (ref 0.0–149.0)
VLDL: 29.2 mg/dL (ref 0.0–40.0)

## 2023-08-17 LAB — MICROALBUMIN / CREATININE URINE RATIO
Creatinine,U: 169 mg/dL
Microalb Creat Ratio: 6.8 mg/g (ref 0.0–30.0)
Microalb, Ur: 1.2 mg/dL (ref 0.0–1.9)

## 2023-08-17 LAB — TSH: TSH: 1.23 u[IU]/mL (ref 0.35–5.50)

## 2023-08-17 MED ORDER — ATORVASTATIN CALCIUM 10 MG PO TABS
10.0000 mg | ORAL_TABLET | ORAL | 1 refills | Status: AC
  Filled 2023-08-17: qty 90, 210d supply, fill #0
  Filled 2023-09-11: qty 42, 98d supply, fill #0
  Filled 2023-12-17: qty 42, 98d supply, fill #1
  Filled 2024-03-25: qty 42, 98d supply, fill #2

## 2023-08-17 NOTE — Progress Notes (Signed)
 Subjective:  Patient ID: Adam Ware, Dr., male    DOB: 04/02/1950  Age: 73 y.o. 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  CC: Hyperlipidemia, Hypertension, Diabetes, and Anemia   HPI Adam Ware, Dr. presents for f/up ----  Discussed the use of AI scribe software for clinical note transcription with the patient, who gave verbal consent to proceed.  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  No chest pain, shortness of breath, claudication, calf tenderness, or leg pain. No issues with his feet.  An EKG performed a couple of months ago in preparation for surgery was normal, and he has not experienced any changes since then.       Outpatient Medications Prior to Visit  Medication Sig Dispense Refill   celecoxib  (CELEBREX ) 100 MG capsule Take 1 capsule (100 mg total) by mouth 2 (two) times daily. 60 capsule 0   finasteride  (PROSCAR ) 5 MG tablet Take 1 tablet (5 mg total) by mouth daily. (Patient taking differently: Take 1 mg by mouth daily.) 90 tablet 1   lisinopril  (ZESTRIL ) 10 MG tablet Take 1 tablet by mouth daily. 90 tablet 1   tadalafil  (CIALIS ) 20 MG tablet Take 1 tablet (20 mg total) by mouth daily as needed for erectile dysfunction. 30 tablet 1   tirzepatide  (MOUNJARO ) 15 MG/0.5ML Pen Inject 15 mg into the skin once a week. 6 mL 0   zolpidem  (AMBIEN  CR) 6.25 MG CR tablet Take 1 tablet (6.25 mg total) by mouth at bedtime as needed for sleep. 90 tablet 0   atorvastatin  (LIPITOR) 10 MG tablet Take 1 tablet (10 mg total) by mouth 3 (three) times a week. 90 tablet 1   metFORMIN  (GLUCOPHAGE -XR) 500 MG 24 hr tablet Take 1 tablet (500 mg total) by mouth daily with breakfast. 90 tablet 0   methocarbamol  (ROBAXIN ) 500 MG tablet Take 1 tablet (500 mg total) by mouth every 8 (eight) hours as needed for muscle spasms. 30 tablet 1   oxyCODONE  (ROXICODONE ) 5 MG immediate release tablet Take 1 tablet (5 mg total) by mouth every 4 (four) hours as needed for  severe pain (pain score 7-10). 30 tablet 0   No facility-administered medications prior to visit.    ROS Review of Systems  Constitutional:  Negative for appetite change, chills, diaphoresis, fatigue and fever.  HENT: Negative.    Respiratory:  Negative for cough, chest tightness, shortness of breath and wheezing.   Cardiovascular:  Negative for chest pain, palpitations and leg swelling.  Gastrointestinal: Negative.  Negative for abdominal pain, constipation, diarrhea, nausea and vomiting.  Endocrine: Negative.   Genitourinary: Negative.   Musculoskeletal:  Positive for arthralgias. Negative for back pain, myalgias and neck pain.  Skin: Negative.   Hematological:  Negative for adenopathy. Does not bruise/bleed easily.  Psychiatric/Behavioral: Negative.      Objective:  BP 130/68 (BP Location: Left Arm, Patient Position: Sitting, Cuff Size: Normal)   Pulse 73   Temp 98.6 F (37 C) (Oral)   Resp 16   Wt 194 lb (88 kg)   SpO2 94%   BMI 26.31 kg/m   BP Readings from Last 3 Encounters:  08/17/23 130/68  06/11/23 (!) 159/91  04/14/23 134/71    Wt Readings from Last 3 Encounters:  08/17/23 194 lb (88 kg)  06/11/23 190 lb (86.2 kg)  04/06/23 195 lb (88.5 kg)    Physical Exam Vitals reviewed.  Constitutional:      Appearance:  Normal appearance.   Eyes:     General: No scleral icterus.    Conjunctiva/sclera: Conjunctivae normal.    Cardiovascular:     Rate and Rhythm: Normal rate and regular rhythm.     Heart sounds: No murmur heard.    No friction rub. No gallop.  Pulmonary:     Effort: Pulmonary effort is normal.     Breath sounds: No stridor. No wheezing, rhonchi or rales.  Abdominal:     General: Abdomen is flat.     Palpations: There is no mass.     Tenderness: There is no abdominal tenderness. There is no guarding.     Hernia: No hernia is present.   Musculoskeletal:        General: Normal range of motion.     Cervical back: Neck supple.     Right  lower leg: No edema.     Left lower leg: No edema.  Lymphadenopathy:     Cervical: No cervical adenopathy.   Skin:    General: Skin is warm and dry.   Neurological:     General: No focal deficit present.     Mental Status: He is alert.   Psychiatric:        Mood and Affect: Mood normal.     Lab Results  Component Value Date   WBC 7.3 08/17/2023   HGB 12.6 (L) 08/17/2023   HCT 38.2 (L) 08/17/2023   PLT 259.0 08/17/2023   GLUCOSE 94 08/17/2023   CHOL 121 08/17/2023   TRIG 146.0 08/17/2023   HDL 33.90 (L) 08/17/2023   LDLDIRECT 59.0 05/05/2018   LDLCALC 58 08/17/2023   ALT 20 04/01/2023   AST 21 04/01/2023   NA 139 08/17/2023   K 3.9 08/17/2023   CL 104 08/17/2023   CREATININE 0.61 08/17/2023   BUN 13 08/17/2023   CO2 28 08/17/2023   TSH 1.23 08/17/2023   PSA 0.31 12/11/2021   INR 1.1 04/13/2023   HGBA1C 6.0 08/17/2023   MICROALBUR 1.2 08/17/2023    No results found.  Assessment & Plan:  Hyperlipidemia LDL goal <100- LDL goal achieved. Doing well on the statin  -     TSH; Future -     Lipid panel; Future -     Atorvastatin  Calcium ; Take 1 tablet (10 mg total) by mouth 3 (three) times a week.  Dispense: 90 tablet; Refill: 1  Pure hyperglyceridemia -     Lipid panel; Future  Type 2 diabetes mellitus without complication, without long-term current use of insulin  (HCC)- A1C is down to 6.0%. Will discontinue metformin . -     Microalbumin / creatinine urine ratio; Future -     Urinalysis, Routine w reflex microscopic; Future -     Hemoglobin A1c; Future -     Basic metabolic panel with GFR; Future -     HM Diabetes Foot Exam  Essential hypertension- BP is well controlled. -     Urinalysis, Routine w reflex microscopic; Future -     Basic metabolic panel with GFR; Future -     CBC with Differential/Platelet; Future  Deficiency anemia -     IBC + Ferritin; Future -     Reticulocytes; Future -     Zinc; Future -     Folate; Future -     Vitamin B12;  Future     Follow-up: Return in about 6 months (around 02/16/2024).  Debby Molt, MD

## 2023-08-17 NOTE — Assessment & Plan Note (Signed)
 Exam completed, labs reviewed, vaccines reviewed, no cancer screenings indicated, pt ed material was given.

## 2023-08-17 NOTE — Patient Instructions (Signed)

## 2023-08-31 ENCOUNTER — Encounter: Payer: Self-pay | Admitting: Rehabilitative and Restorative Service Providers"

## 2023-08-31 ENCOUNTER — Ambulatory Visit: Admitting: Rehabilitative and Restorative Service Providers"

## 2023-08-31 DIAGNOSIS — R293 Abnormal posture: Secondary | ICD-10-CM | POA: Diagnosis not present

## 2023-08-31 DIAGNOSIS — M25512 Pain in left shoulder: Secondary | ICD-10-CM | POA: Diagnosis not present

## 2023-08-31 DIAGNOSIS — M25612 Stiffness of left shoulder, not elsewhere classified: Secondary | ICD-10-CM

## 2023-08-31 DIAGNOSIS — M6281 Muscle weakness (generalized): Secondary | ICD-10-CM | POA: Diagnosis not present

## 2023-08-31 NOTE — Therapy (Addendum)
 OUTPATIENT PHYSICAL THERAPY TREATMENT / PROGRESS NOTE  / DISCHARGE  Patient Name: Adam Ware, Dr. 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 DOB:25-Jun-1950, 73 y.o., male Today's Date: 08/31/2023  Progress Note Reporting Period 07/03/2023  to 08/31/2023  See note below for Objective Data and Assessment of Progress/Goals.      END OF SESSION:  PT End of Session - 08/31/23 1518     Visit Number 5    Number of Visits 17    Date for PT Re-Evaluation 08/28/23    Authorization Type HTA    Authorization Time Period 07/03/23 to 08/28/23    PT Start Time 1515    PT Stop Time 1553    PT Time Calculation (min) 38 min    Activity Tolerance Patient tolerated treatment well;No increased pain    Behavior During Therapy WFL for tasks assessed/performed              Past Medical History:  Diagnosis Date   Adrenal tumor    rigth   Arthritis    Cancer (HCC)    basal cell   Diabetes mellitus without complication (HCC)    type 2; tx metformin  and wkly ozempic  on Sundays   Hair loss    History of kidney stones    surgery to remove   Hyperlipidemia    tx w/atorvastatin    Hypertension    tx w/lisinopril    Insomnia    tx w/ambien    Past Surgical History:  Procedure Laterality Date   APPENDECTOMY     COLONOSCOPY     I & D KNEE WITH POLY EXCHANGE Right 01/24/2022   Procedure: IRRIGATION AND DEBRIDEMENT RIGHT KNEE WITH POLY EXCHANGE;  Surgeon: Harden Jerona GAILS, MD;  Location: [facility];  Service: Orthopedics;  Laterality: Right;   IR URETERAL STENT LEFT NEW ACCESS W/O SEP NEPHROSTOMY CATH  04/13/2023   LITHOTRIPSY     MINOR CARPAL TUNNEL Left 11/07/2022   Procedure: MINOR CARPAL TUNNEL;  Surgeon: Arlinda Buster, MD;  Location: [facility];  Service: Orthopedics;  Laterality: Left;  NEEDS RNFA (OPEN)   NEPHROLITHOTOMY Left 04/13/2023   Procedure: LEFT NEPHROLITHOTOMY PERCUTANEOUS;  Surgeon: Carolee Sherwood JONETTA DOUGLAS, MD;  Location: [facility];  Service: Urology;  Laterality: Left;  150 MINUTE CASE    POSTERIOR LUMBAR FUSION 2 WITH HARDWARE REMOVAL Left 06/11/2023   Procedure: ARTHROSCOPY, SHOULDER WITH DEBRIDEMENT;  Surgeon: Addie Cordella Hamilton, MD;  Location: [facility] OR;  Service: Orthopedics;  Laterality: Left;  LEFT SHOULDER ARTHROSCOPY, DEBRIDEMENT   REPLACEMENT TOTAL KNEE Right    SHOULDER OPEN ROTATOR CUFF REPAIR Left 06/11/2023   Procedure: REPAIR, ROTATOR CUFF, OPEN;  Surgeon: Addie Cordella Hamilton, MD;  Location: [facility] OR;  Service: Orthopedics;  Laterality: Left;  BICEPS TENODESIS, MINI OPEN ROTATOR CUFF TEAR REPAIR WITH CUFFMEND AUGMENTATION   TOTAL KNEE REVISION Right 04/22/2017   Procedure: REVISION RIGHT TOTAL KNEE ARTHROPLASTY VS. POLY EXCHANGE;  Surgeon: Harden Jerona GAILS, MD;  Location: [facility];  Service: Orthopedics;  Laterality: Right;   Patient Active Problem List   Diagnosis Date Noted   Deficiency anemia 08/17/2023   Synovitis of left shoulder 06/13/2023   Degenerative superior labral anterior-to-posterior (SLAP) tear of left shoulder 06/13/2023   Nontraumatic complete tear of left rotator cuff 06/13/2023   Biceps tendonitis on left 06/13/2023   Renal calculi 04/13/2023   Right adrenal mass (HCC) 02/13/2023   Left renal stone 02/13/2023   High serum lipoprotein(a) 09/05/2022   Encounter for general adult medical examination with abnormal findings 06/12/2021  Carpal tunnel syndrome, left upper limb 11/28/2020   Carpal tunnel syndrome, right upper limb 11/28/2020   Erectile dysfunction due to arterial insufficiency 09/24/2020   Benign prostatic hyperplasia with weak urinary stream 05/05/2018   Hyperlipidemia LDL goal <100 05/05/2018   Pure hyperglyceridemia 05/05/2018   Psychophysiological insomnia 05/05/2018   Lumbar radiculopathy 07/15/2017   Hyperlipidemia associated with type 2 diabetes mellitus (HCC) 06/10/2017   Essential hypertension 06/10/2017   Type 2 diabetes mellitus without complication, without long-term current use of insulin  (HCC) 06/10/2017   Spondylolisthesis,  lumbar region 04/21/2017    PCP: Joshua Ned MD   REFERRING PROVIDER: Addie Cordella Hamilton, MD  REFERRING DIAG: Diagnosis [phone] (ICD-10-CM) - S/P rotator cuff repair  THERAPY DIAG:  Muscle weakness (generalized)  Acute pain of left shoulder  Abnormal posture  Stiffness of left shoulder, not elsewhere classified  Rationale for Evaluation and Treatment: Rehabilitation  ONSET DATE: surgery 06/11/23  SUBJECTIVE:                                                                                                                                                                                      SUBJECTIVE STATEMENT: Pt indicated no specific sharp pain.  Does notice some symptoms with reaching at times.  Reported tightness noted at times.  Reported overall improvement to normal at about 90 %.   Hand dominance: Left  PERTINENT HISTORY:  See above   PAIN:   NPRS scale: 0/10 Pain location: Lt shoulder Pain description:  Aggravating factors:  Relieving factors:    PRECAUTIONS:  left shoulder s/p open RCR on 06/11/23 PROM and isometric per MD referral   RED FLAGS: None   WEIGHT BEARING RESTRICTIONS: Yes NWB surgical shoulder   FALLS:  Has patient fallen in last 6 months? No  LIVING ENVIRONMENT: Lives with: lives alone Lives in: House/apartment  OCCUPATION: MD  PLOF: Independent, Independent with basic ADLs, Independent with gait, and Independent with transfers  PATIENT GOALS:  Get shoulder functional again    NEXT MD VISIT:   OBJECTIVE:  Note: Objective measures were completed at Evaluation unless otherwise noted.  PATIENT SURVEYS:   Patient-Specific Activity Scoring Scheme  0 represents "unable to perform." 10 represents "able to perform at prior level. 0 1 2 3 4 5 6 7 8 9  10 (Date and Score)   Activity Eval  07/03/2023  08/31/2023   1. Washing hair   7  10  2. Reaching into opposite armpit   3  10  3. Personal hygiene  2 10  4.    5.     Score 4 10   Total score = sum of the activity scores/number  of activities Minimum detectable change (90%CI) for average score = 2 points Minimum detectable change (90%CI) for single activity score = 3 points   COGNITION: 07/03/2023 Overall cognitive status: Within functional limits for tasks assessed       UPPER EXTREMITY ROM:   Passive ROM Right eval Left Eval 07/03/2023 Left 07/27/23 Passive  supine Left 07/31/2023 Left 08/10/2023 Left 08/31/2023  Shoulder flexion  110* 145 145 WFL s shrug   Shoulder extension        Shoulder scaption   100* 115     Shoulder adduction        Shoulder internal rotation  Hand to belly at 0* ABD   40 degrees at 70 degrees abduction  70 AROM in 90 deg abduction supine  Shoulder external rotation  About 20* at 0* ABD 70 deg Shoulder abd 45 deg 80 degrees at 70 degrees abduction  75 AROM in 90 deg abduction supine  Elbow flexion        Elbow extension        Wrist flexion        Wrist extension        Wrist ulnar deviation        Wrist radial deviation        Wrist pronation        Wrist supination        (Blank rows = not tested)  UPPER EXTREMITY MMT:  MMT Right 08/31/2023 Left 08/31/2023  Shoulder flexion 4/5 5/5  Shoulder extension    Shoulder abduction  4+/5  Shoulder adduction    Shoulder internal rotation  5/5  Shoulder external rotation  4+/5  Middle trapezius    Lower trapezius    Elbow flexion    Elbow extension    Wrist flexion    Wrist extension    Wrist ulnar deviation    Wrist radial deviation    Wrist pronation    Wrist supination    Grip strength (lbs)    (Blank rows = not tested)  MMT not checked at eval due to surgical precautions   PALPATION:  07/03/2023 Very tight in anterior shoulder musculature and into bicep                  TREATMENT         DATE:  08/31/2023 Therex UBE fwd/back 4 mins each way with interval 10 seconds at top of each minute lvl 3.0  Review of existing HEP.   Neuro Re-ed Wall  push up SA press hold 3 seconds 2 x 10  Standing Lt shoulder 100 deg flexion 2 lb ball at wall cw, ccw circles 2 x 30 each way Band Er walk outs with towel under arm 10 sec hold x 10  Standing blue band GH ext 2 x 15 bilateral   TREATMENT         DATE:  08/10/2023 Therex Pulley flexion, scaption x 5 each Lt UE Strap stretch Lt hand behind back 15 sec x 3  Cross arm stretch 15 sec x 3  Time spent in education of new exercise progressions.    Neuro Re-ed Standing blue band rows c scapular retraction focus bilateral 2 x 15 Standing blue band GH ext to legs 2 x 15  Standing Lt shoulder 100 deg flexion 2 lb ball at wall cw, ccw circles 2 x 30 each way  TREATMENT         DATE: 07/31/2023 PROM IR/ER with PT for 20 minutes Shoulder IR stretch 20 x 10 seconds  Functional Activities: Shoulder blade pinches/scapular retraction 10 x 5 seconds (Posture and for better scapular positioning for reaching and overhead function) Supine arm raises/scapular protraction 20 x 3 seconds (for reaching and overhead function, OK to add 1# at home)   PATIENT EDUCATION: Education details: see HEP below  Person educated: Patient Education method: Explanation, Demonstration, and Handouts Education comprehension: verbalized understanding, returned demonstration, and needs further education  HOME EXERCISE PROGRAM: Access Code: [email or web address] URL: [email or web address] Date: 08/10/2023 Prepared by: Ozell Silvan  Exercises - Seated Scapular Retraction  - 5 x daily - 7 x weekly - 1 sets - 5 reps - 5 seconds hold - Supine Shoulder Internal Rotation (Mirrored)  - 2 x daily - 7 x weekly - 1 sets - 10 reps - 10 seconds hold - Supine Scapular Protraction in Flexion with Dumbbells  - 2 x daily - 7 x weekly - 1 sets - 20 reps - 3 seconds hold - Standing Shoulder Internal Rotation  Stretch with Towel (Mirrored)  - 1-2 x daily - 7 x weekly - 1 sets - 3-5 reps - 15-20 hold - Standing Shoulder Posterior Capsule Stretch  - 1-2 x daily - 7 x weekly - 1 sets - 5 reps - 15-30 hold - Standing Bilateral Low Shoulder Row with Anchored Resistance  - 1-2 x daily - 7 x weekly - 2-3 sets - 10-15 reps - Shoulder Extension with Resistance  - 1-2 x daily - 7 x weekly - 2-3 sets - 10-15 reps - Shoulder External Rotation Reactive Isometrics  - 1 x daily - 7 x weekly - 1 sets - 10 reps - 5-15 hold  ASSESSMENT:  CLINICAL IMPRESSION: The patient has attended 5 visits over the course of treatment cycle.  Patient has reported overall improvement at 90. See objective data above for updated information regarding current presentation.  Good progress note to this point in range and in strength.  May continue to benefit from HEP gains in strengthening.  Open to return to clinic if desired for continued progression.  Has MD visit upcoming.    OBJECTIVE IMPAIRMENTS: decreased ROM, decreased strength, hypomobility, increased edema, increased fascial restrictions, increased muscle spasms, impaired flexibility, impaired UE functional use, improper body mechanics, postural dysfunction, and pain.   ACTIVITY LIMITATIONS: carrying, lifting, bathing, dressing, reach over head, hygiene/grooming, and caring for others  PARTICIPATION LIMITATIONS: meal prep, cleaning, laundry, driving, shopping, community activity, occupation, and yard work  PERSONAL FACTORS: Age, Behavior pattern, Education, Past/current experiences, Social background, and Time since onset of injury/illness/exacerbation are also affecting patient's functional outcome.   REHAB POTENTIAL: Good  CLINICAL DECISION MAKING: Stable/uncomplicated  EVALUATION COMPLEXITY: Low   GOALS: Goals reviewed with patient? No  SHORT TERM GOALS: Target date: 07/31/2023    Patient will be compliant with appropriate progressive HEP        GOAL STATUS: Met  07/31/2023  2. Lt shoulder flexion and ABD A/PROM to be at least 130*         GOAL STATUS: Met 07/31/2023   3. Lt shoulder IR and ER A/PROM to be at least 45* at 45* ABD away from trunk           GOAL STATUS: Met 07/31/2023   4. Will be more aware of posture with all functional tasks with use of ergonomic aides PRN/as desired  GOAL STATUS: Ongoing 07/31/2023   LONG TERM GOALS: Target date: 08/28/2023    MMT to have improved by at least one grade in all weak groups       GOAL STATUS: Met  08/31/2023   2. AROM to have normalized and will be pain free all planes of motion      GOAL STATUS: Mostly met 08/31/2023    3. Pain to be no more than 2/10 with all functional tasks     GOAL STATUS:Met  08/31/2023   4. Will be able to perform all functional household and work based tasks without increase from resting pain levels     GOAL STATUS: mostly met 08/31/2023   5. PSFS  to have improved by at least 7 to show improved QOL and subjective perception of condition      GOAL STATUS: met 08/31/2023   PLAN:  PT FREQUENCY: 2x/week  PT DURATION: 8 weeks  PLANNED INTERVENTIONS: 97750- Physical Performance Testing, 97110-Therapeutic exercises, 97530- Therapeutic activity, 97112- Neuromuscular re-education, 97535- Self Care, 02859- Manual therapy, 97016- Vasopneumatic device, Taping, and Dry Needling  PLAN FOR NEXT SESSION:  Return to MD.     Ozell Silvan, PT, DPT, OCS, ATC 08/31/23  3:51 PM   PHYSICAL THERAPY DISCHARGE SUMMARY  Visits from Start of Care: 5  Current functional level related to goals / functional outcomes: See note   Remaining deficits: See note   Education / Equipment: HEP  Patient goals were met. Patient is being discharged due to not returning since the last visit.  Ozell Silvan, PT, DPT, OCS, ATC 10/15/23  9:51 AM

## 2023-09-07 ENCOUNTER — Encounter: Payer: Self-pay | Admitting: Orthopedic Surgery

## 2023-09-07 ENCOUNTER — Ambulatory Visit (INDEPENDENT_AMBULATORY_CARE_PROVIDER_SITE_OTHER): Admitting: Orthopedic Surgery

## 2023-09-07 DIAGNOSIS — Z9889 Other specified postprocedural states: Secondary | ICD-10-CM

## 2023-09-07 NOTE — Progress Notes (Signed)
 Post-Op Visit Note   Patient: Adam Ware, Dr.           Date of Birth: 05/18/1950           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 Visit Date: 09/07/2023 PCP: Joshua Debby CROME, MD   Assessment & Plan:  Chief Complaint:  Chief Complaint  Patient presents with   Left Shoulder - Routine Post Op     06/11/23    Arthroscopy, Shoulder With Debridement - Left      Repair, Rotator Cuff, Open    Visit Diagnoses:  1. S/P rotator cuff repair     Plan: Quinlin is now 3 months out left shoulder arthroscopy with biceps tenodesis and mini open rotator cuff tendon repair of the supraspinatus and infraspinatus.  He has been working on range of motion and is doing some strength training with bands.  Doing physical therapy.  Did go out had about 20 golf balls this weekend and had mild discomfort which for him is noteworthy.  On exam he has good passive range of motion and can put his hand behind his back.  Motion is 60/100/165.  Has very good rotator cuff strength infraspinatus supraspinatus and subscap muscle testing.  Fairly minimal crepitus with passive range of motion of the shoulder at 90 degrees of abduction plan at this time is to continue physical therapy and follow-up in 8 weeks for final check.  Follow-Up Instructions: No follow-ups on file.   Orders:  No orders of the defined types were placed in this encounter.  No orders of the defined types were placed in this encounter.   Imaging: No results found.  PMFS History: Patient Active Problem List   Diagnosis Date Noted   Deficiency anemia 08/17/2023   Synovitis of left shoulder 06/13/2023   Degenerative superior labral anterior-to-posterior (SLAP) tear of left shoulder 06/13/2023   Nontraumatic complete tear of left rotator cuff 06/13/2023   Biceps tendonitis on left 06/13/2023   Renal calculi 04/13/2023   Right adrenal mass (HCC) 02/13/2023   Left renal stone 02/13/2023   High serum lipoprotein(a) 09/05/2022   Encounter for general adult medical  examination with abnormal findings 06/12/2021   Carpal tunnel syndrome, left upper limb 11/28/2020   Carpal tunnel syndrome, right upper limb 11/28/2020   Erectile dysfunction due to arterial insufficiency 09/24/2020   Benign prostatic hyperplasia with weak urinary stream 05/05/2018   Hyperlipidemia LDL goal <100 05/05/2018   Pure hyperglyceridemia 05/05/2018   Psychophysiological insomnia 05/05/2018   Lumbar radiculopathy 07/15/2017   Hyperlipidemia associated with type 2 diabetes mellitus (HCC) 06/10/2017   Essential hypertension 06/10/2017   Type 2 diabetes mellitus without complication, without long-term current use of insulin  (HCC) 06/10/2017   Spondylolisthesis, lumbar region 04/21/2017   Past Medical History:  Diagnosis Date   Adrenal tumor    rigth   Arthritis    Cancer (HCC)    basal cell   Diabetes mellitus without complication (HCC)    type 2; tx metformin  and wkly ozempic  on Sundays   Hair loss    History of kidney stones    surgery to remove   Hyperlipidemia    tx w/atorvastatin    Hypertension    tx w/lisinopril    Insomnia    tx w/ambien     Family History  Problem Relation Age of Onset   Cancer Mother    Diabetes Mother    Mental illness Mother    Heart disease Father    Arthritis Brother     Past Surgical  History:  Procedure Laterality Date   APPENDECTOMY     COLONOSCOPY     I & D KNEE WITH POLY EXCHANGE Right 01/24/2022   Procedure: IRRIGATION AND DEBRIDEMENT RIGHT KNEE WITH POLY EXCHANGE;  Surgeon: Harden Jerona GAILS, MD;  Location: [facility];  Service: Orthopedics;  Laterality: Right;   IR URETERAL STENT LEFT NEW ACCESS W/O SEP NEPHROSTOMY CATH  04/13/2023   LITHOTRIPSY     MINOR CARPAL TUNNEL Left 11/07/2022   Procedure: MINOR CARPAL TUNNEL;  Surgeon: Arlinda Buster, MD;  Location: [facility];  Service: Orthopedics;  Laterality: Left;  NEEDS RNFA (OPEN)   NEPHROLITHOTOMY Left 04/13/2023   Procedure: LEFT NEPHROLITHOTOMY PERCUTANEOUS;   Surgeon: Carolee Sherwood JONETTA DOUGLAS, MD;  Location: [facility];  Service: Urology;  Laterality: Left;  150 MINUTE CASE   POSTERIOR LUMBAR FUSION 2 WITH HARDWARE REMOVAL Left 06/11/2023   Procedure: ARTHROSCOPY, SHOULDER WITH DEBRIDEMENT;  Surgeon: Addie Cordella Hamilton, MD;  Location: [facility] OR;  Service: Orthopedics;  Laterality: Left;  LEFT SHOULDER ARTHROSCOPY, DEBRIDEMENT   REPLACEMENT TOTAL KNEE Right    SHOULDER OPEN ROTATOR CUFF REPAIR Left 06/11/2023   Procedure: REPAIR, ROTATOR CUFF, OPEN;  Surgeon: Addie Cordella Hamilton, MD;  Location: [facility] OR;  Service: Orthopedics;  Laterality: Left;  BICEPS TENODESIS, MINI OPEN ROTATOR CUFF TEAR REPAIR WITH CUFFMEND AUGMENTATION   TOTAL KNEE REVISION Right 04/22/2017   Procedure: REVISION RIGHT TOTAL KNEE ARTHROPLASTY VS. POLY EXCHANGE;  Surgeon: Harden Jerona GAILS, MD;  Location: [facility];  Service: Orthopedics;  Laterality: Right;   Social History   Occupational History   Occupation: Part time  Tobacco Use   Smoking status: Never    Passive exposure: Never   Smokeless tobacco: Never  Vaping Use   Vaping status: Never Used  Substance and Sexual Activity   Alcohol use: Yes    Alcohol/week: 1.0 standard drink of alcohol    Types: 1 Glasses of wine per week    Comment: wine daily   Drug use: No   Sexual activity: Not Currently    Partners: Male

## 2023-09-11 ENCOUNTER — Other Ambulatory Visit (HOSPITAL_COMMUNITY): Payer: Self-pay

## 2023-09-11 ENCOUNTER — Other Ambulatory Visit: Payer: Self-pay

## 2023-09-11 DIAGNOSIS — L720 Epidermal cyst: Secondary | ICD-10-CM | POA: Diagnosis not present

## 2023-09-14 ENCOUNTER — Encounter: Admitting: Orthopedic Surgery

## 2023-10-07 ENCOUNTER — Other Ambulatory Visit (HOSPITAL_COMMUNITY): Payer: Self-pay

## 2023-10-07 ENCOUNTER — Other Ambulatory Visit: Payer: Self-pay | Admitting: Internal Medicine

## 2023-10-07 DIAGNOSIS — E119 Type 2 diabetes mellitus without complications: Secondary | ICD-10-CM

## 2023-10-07 MED ORDER — MOUNJARO 15 MG/0.5ML ~~LOC~~ SOAJ
15.0000 mg | SUBCUTANEOUS | 0 refills | Status: DC
  Filled 2023-10-07: qty 6, 84d supply, fill #0

## 2023-10-08 ENCOUNTER — Other Ambulatory Visit: Payer: Self-pay

## 2023-10-08 ENCOUNTER — Other Ambulatory Visit (HOSPITAL_COMMUNITY): Payer: Self-pay

## 2023-10-08 ENCOUNTER — Encounter: Payer: Self-pay | Admitting: Internal Medicine

## 2023-10-08 ENCOUNTER — Other Ambulatory Visit: Payer: Self-pay | Admitting: Internal Medicine

## 2023-10-08 MED ORDER — ACETAZOLAMIDE 250 MG PO TABS
250.0000 mg | ORAL_TABLET | Freq: Two times a day (BID) | ORAL | 0 refills | Status: DC
  Filled 2023-10-08: qty 10, 5d supply, fill #0

## 2023-10-08 MED ORDER — ACETAZOLAMIDE 250 MG PO TABS: 250.0000 mg | ORAL_TABLET | Freq: Two times a day (BID) | ORAL | 0 refills | Status: DC

## 2023-10-14 ENCOUNTER — Other Ambulatory Visit: Payer: Self-pay | Admitting: Internal Medicine

## 2023-10-14 ENCOUNTER — Encounter: Payer: Self-pay | Admitting: Internal Medicine

## 2023-10-14 DIAGNOSIS — E119 Type 2 diabetes mellitus without complications: Secondary | ICD-10-CM

## 2023-10-14 DIAGNOSIS — I10 Essential (primary) hypertension: Secondary | ICD-10-CM

## 2023-10-15 ENCOUNTER — Other Ambulatory Visit (HOSPITAL_COMMUNITY): Payer: Self-pay

## 2023-10-15 MED ORDER — LISINOPRIL 10 MG PO TABS
10.0000 mg | ORAL_TABLET | Freq: Every day | ORAL | 0 refills | Status: DC
  Filled 2023-10-15 – 2023-10-21 (×2): qty 90, 90d supply, fill #0

## 2023-10-21 ENCOUNTER — Other Ambulatory Visit: Payer: Self-pay

## 2023-10-21 ENCOUNTER — Other Ambulatory Visit (HOSPITAL_COMMUNITY): Payer: Self-pay

## 2023-11-09 ENCOUNTER — Encounter: Payer: Self-pay | Admitting: Orthopedic Surgery

## 2023-11-09 ENCOUNTER — Ambulatory Visit (INDEPENDENT_AMBULATORY_CARE_PROVIDER_SITE_OTHER): Admitting: Orthopedic Surgery

## 2023-11-09 DIAGNOSIS — Z9889 Other specified postprocedural states: Secondary | ICD-10-CM

## 2023-11-09 NOTE — Progress Notes (Unsigned)
 Post-Op Visit Note   Patient: Adam Ware, Dr.           Date of Birth: June 04, 1950           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 Visit Date: 11/09/2023 PCP: Adam Debby CROME, MD   Assessment & Plan:  Chief Complaint:  Chief Complaint  Patient presents with   Left Shoulder - Routine Post Op      06/11/23    Arthroscopy, Shoulder With Debridement - Left      Repair, Rotator Cuff, Open       Visit Diagnoses: No diagnosis found.  Plan: Adam Ware is now about 5 months out left shoulder rotator cuff tear repair of a complex tear of the infraspinatus and separate tear of the supraspinatus.  He has had a fall on the left shoulder when he was traveling [place].  Improved.  A little soreness after that.  He has been able to play golf.  Not taking any medication.  Examination he has very good external rotation strength bilaterally 5 out of 5.  He has been doing his own physical therapy with bands and avoiding overhead strengthening.  Passive range of motion is 70/95/160 and I do not feel any crepitus or coarseness with passive range of motion at this time.  Plan is to continue his home exercise regimen to focus on below shoulder level external rotation strengthening.  No follow-up with us  as needed.  Follow-Up Instructions: No follow-ups on file.   Orders:  No orders of the defined types were placed in this encounter.  No orders of the defined types were placed in this encounter.   Imaging: No results found.  PMFS History: Patient Active Problem List   Diagnosis Date Noted   Deficiency anemia 08/17/2023   Synovitis of left shoulder 06/13/2023   Degenerative superior labral anterior-to-posterior (SLAP) tear of left shoulder 06/13/2023   Nontraumatic complete tear of left rotator cuff 06/13/2023   Biceps tendonitis on left 06/13/2023   Renal calculi 04/13/2023   Right adrenal mass 02/13/2023   Left renal stone 02/13/2023   High serum lipoprotein(a) 09/05/2022   Encounter for general adult medical  examination with abnormal findings 06/12/2021   Carpal tunnel syndrome, left upper limb 11/28/2020   Carpal tunnel syndrome, right upper limb 11/28/2020   Erectile dysfunction due to arterial insufficiency 09/24/2020   Benign prostatic hyperplasia with weak urinary stream 05/05/2018   Hyperlipidemia LDL goal <100 05/05/2018   Pure hyperglyceridemia 05/05/2018   Psychophysiological insomnia 05/05/2018   Lumbar radiculopathy 07/15/2017   Hyperlipidemia associated with type 2 diabetes mellitus (HCC) 06/10/2017   Essential hypertension 06/10/2017   Type 2 diabetes mellitus without complication, without long-term current use of insulin  (HCC) 06/10/2017   Spondylolisthesis, lumbar region 04/21/2017   Past Medical History:  Diagnosis Date   Adrenal tumor    rigth   Arthritis    Cancer (HCC)    basal cell   Diabetes mellitus without complication (HCC)    type 2; tx metformin  and wkly ozempic  on Sundays   Hair loss    History of kidney stones    surgery to remove   Hyperlipidemia    tx w/atorvastatin    Hypertension    tx w/lisinopril    Insomnia    tx w/ambien     Family History  Problem Relation Age of Onset   Cancer Mother    Diabetes Mother    Mental illness Mother    Heart disease Father    Arthritis Brother  Past Surgical History:  Procedure Laterality Date   APPENDECTOMY     COLONOSCOPY     I & D KNEE WITH POLY EXCHANGE Right 01/24/2022   Procedure: IRRIGATION AND DEBRIDEMENT RIGHT KNEE WITH POLY EXCHANGE;  Surgeon: Harden Jerona GAILS, MD;  Location: [facility];  Service: Orthopedics;  Laterality: Right;   IR URETERAL STENT LEFT NEW ACCESS W/O SEP NEPHROSTOMY CATH  04/13/2023   LITHOTRIPSY     MINOR CARPAL TUNNEL Left 11/07/2022   Procedure: MINOR CARPAL TUNNEL;  Surgeon: Arlinda Buster, MD;  Location: [facility];  Service: Orthopedics;  Laterality: Left;  NEEDS RNFA (OPEN)   NEPHROLITHOTOMY Left 04/13/2023   Procedure: LEFT NEPHROLITHOTOMY PERCUTANEOUS;   Surgeon: Carolee Sherwood JONETTA DOUGLAS, MD;  Location: [facility];  Service: Urology;  Laterality: Left;  150 MINUTE CASE   POSTERIOR LUMBAR FUSION 2 WITH HARDWARE REMOVAL Left 06/11/2023   Procedure: ARTHROSCOPY, SHOULDER WITH DEBRIDEMENT;  Surgeon: Adam Cordella Hamilton, MD;  Location: [facility] OR;  Service: Orthopedics;  Laterality: Left;  LEFT SHOULDER ARTHROSCOPY, DEBRIDEMENT   REPLACEMENT TOTAL KNEE Right    SHOULDER OPEN ROTATOR CUFF REPAIR Left 06/11/2023   Procedure: REPAIR, ROTATOR CUFF, OPEN;  Surgeon: Adam Cordella Hamilton, MD;  Location: [facility] OR;  Service: Orthopedics;  Laterality: Left;  BICEPS TENODESIS, MINI OPEN ROTATOR CUFF TEAR REPAIR WITH CUFFMEND AUGMENTATION   TOTAL KNEE REVISION Right 04/22/2017   Procedure: REVISION RIGHT TOTAL KNEE ARTHROPLASTY VS. POLY EXCHANGE;  Surgeon: Harden Jerona GAILS, MD;  Location: [facility];  Service: Orthopedics;  Laterality: Right;   Social History   Occupational History   Occupation: Part time  Tobacco Use   Smoking status: Never    Passive exposure: Never   Smokeless tobacco: Never  Vaping Use   Vaping status: Never Used  Substance and Sexual Activity   Alcohol use: Yes    Alcohol/week: 1.0 standard drink of alcohol    Types: 1 Glasses of wine per week    Comment: wine daily   Drug use: No   Sexual activity: Not Currently    Partners: Male

## 2023-12-14 ENCOUNTER — Other Ambulatory Visit: Payer: Self-pay | Admitting: *Deleted

## 2023-12-14 DIAGNOSIS — R002 Palpitations: Secondary | ICD-10-CM

## 2023-12-17 ENCOUNTER — Other Ambulatory Visit: Payer: Self-pay | Admitting: *Deleted

## 2023-12-17 DIAGNOSIS — R002 Palpitations: Secondary | ICD-10-CM

## 2023-12-18 ENCOUNTER — Telehealth: Payer: Self-pay

## 2023-12-18 ENCOUNTER — Other Ambulatory Visit (HOSPITAL_COMMUNITY): Payer: Self-pay

## 2023-12-18 NOTE — Telephone Encounter (Signed)
 Copied from CRM [id number]. Topic: General - Other >> Dec 18, 2023 12:15 PM Thersia BROCKS wrote: Reason for CRM: Patient called in stated he is having PVC EKG already in record and need to get referral for echocardiogram  Referral to Lynwood Schilling, MD - already discuss this with Dr.Hochrein

## 2023-12-18 NOTE — Telephone Encounter (Signed)
**Note De-identified  [place] Obfuscation** Please advise 

## 2023-12-20 ENCOUNTER — Emergency Department (HOSPITAL_COMMUNITY)

## 2023-12-20 ENCOUNTER — Other Ambulatory Visit: Payer: Self-pay

## 2023-12-20 ENCOUNTER — Emergency Department (HOSPITAL_COMMUNITY)
Admission: EM | Admit: 2023-12-20 | Discharge: 2023-12-20 | Disposition: A | Discharge status: Home / Self Care | Attending: Emergency Medicine | Admitting: Emergency Medicine

## 2023-12-20 DIAGNOSIS — I493 Ventricular premature depolarization: Secondary | ICD-10-CM | POA: Insufficient documentation

## 2023-12-20 DIAGNOSIS — D649 Anemia, unspecified: Secondary | ICD-10-CM | POA: Insufficient documentation

## 2023-12-20 DIAGNOSIS — R002 Palpitations: Secondary | ICD-10-CM | POA: Diagnosis not present

## 2023-12-20 DIAGNOSIS — I10 Essential (primary) hypertension: Secondary | ICD-10-CM | POA: Insufficient documentation

## 2023-12-20 DIAGNOSIS — E1165 Type 2 diabetes mellitus with hyperglycemia: Secondary | ICD-10-CM | POA: Insufficient documentation

## 2023-12-20 LAB — COMPREHENSIVE METABOLIC PANEL WITH GFR
ALT: 24 U/L (ref 0–44)
AST: 25 U/L (ref 15–41)
Albumin: 3.9 g/dL (ref 3.5–5.0)
Alkaline Phosphatase: 120 U/L (ref 38–126)
Anion gap: 9 (ref 5–15)
BUN: 14 mg/dL (ref 8–23)
CO2: 25 mmol/L (ref 22–32)
Calcium: 8.6 mg/dL — ABNORMAL LOW (ref 8.9–10.3)
Chloride: 107 mmol/L (ref 98–111)
Creatinine, Ser: 0.63 mg/dL (ref 0.61–1.24)
GFR, Estimated: >60 mL/min (ref >60)
Glucose, Bld: 219 mg/dL — ABNORMAL HIGH (ref 70–99)
Potassium: 4.2 mmol/L (ref 3.5–5.1)
Sodium: 141 mmol/L (ref 135–145)
Total Bilirubin: 0.6 mg/dL (ref 0.0–1.2)
Total Protein: 5.6 g/dL — ABNORMAL LOW (ref 6.5–8.1)

## 2023-12-20 LAB — CBC WITH DIFFERENTIAL/PLATELET
Abs Immature Granulocytes: 0.06 K/uL (ref 0.00–0.07)
Basophils Absolute: 0 K/uL (ref 0.0–0.1)
Basophils Relative: 0 %
Eosinophils Absolute: 0.1 K/uL (ref 0.0–0.5)
Eosinophils Relative: 1 %
HCT: 40.8 % (ref 39.0–52.0)
Hemoglobin: 12.6 g/dL — ABNORMAL LOW (ref 13.0–17.0)
Immature Granulocytes: 1 %
Lymphocytes Relative: 41 %
Lymphs Abs: 2.8 K/uL (ref 0.7–4.0)
MCH: 27.6 pg (ref 26.0–34.0)
MCHC: 30.9 g/dL (ref 30.0–36.0)
MCV: 89.5 fL (ref 80.0–100.0)
Monocytes Absolute: 0.4 K/uL (ref 0.1–1.0)
Monocytes Relative: 6 %
Neutro Abs: 3.5 K/uL (ref 1.7–7.7)
Neutrophils Relative %: 51 %
Platelets: 256 K/uL (ref 150–400)
RBC: 4.56 MIL/uL (ref 4.22–5.81)
RDW: 13.9 % (ref 11.5–15.5)
WBC: 6.8 K/uL (ref 4.0–10.5)
nRBC: 0 % (ref 0.0–0.2)

## 2023-12-20 LAB — PRO BRAIN NATRIURETIC PEPTIDE: Pro Brain Natriuretic Peptide: 116 pg/mL (ref <300.0)

## 2023-12-20 LAB — TSH: TSH: 0.956 u[IU]/mL (ref 0.350–4.500)

## 2023-12-20 LAB — TROPONIN T, HIGH SENSITIVITY: Troponin T High Sensitivity: 16 ng/L (ref 0–19)

## 2023-12-20 LAB — MAGNESIUM: Magnesium: 2 mg/dL (ref 1.7–2.4)

## 2023-12-20 MED ORDER — LACTATED RINGERS IV BOLUS
500.0000 mL | Freq: Once | INTRAVENOUS | Status: AC
  Administered 2023-12-20: 500 mL via INTRAVENOUS

## 2023-12-20 NOTE — ED Provider Notes (Signed)
 [facility] AT 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 Patient arrived via POV.  He history provided by patient.  No interpreter required during this encounter.  Patient reports that he has intermittently had palpitations for approximately 1 month.  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  Reports that he is awaiting follow-up with his cardiologist, Dr. Lynwood Schilling, for a echo to be performed.  Reports that he had increased PVC frequency today, and he auscultated himself with his stethoscope, and became concerned for bigeminy, therefore he wanted to come to the emergency department for further evaluation.  He reports that he has had some sensation of feeling off balance, however has not had any lightheadedness, dizziness, chest pain, shortness of breath, nausea, vomiting, diarrhea, lower extremity edema, dyspnea on exertion, paroxysmal nocturnal dyspnea.  States that he would like a TSH to be checked as he has not yet had this test performed.  Patient does state that he feels like he may be mildly dehydrated.  Patient's recorded medical, surgical, social, medication list and allergies were reviewed in the Snapshot window as part of the initial history.   Prior to Admission medications   Medication Sig Start Date End Date Taking? Authorizing Provider  acetaZOLAMIDE  (DIAMOX ) 250 MG tablet Take 1 tablet (250 mg total) by mouth 2 (two) times daily for 5 days. Start 48 hours before ascending and continue for 72 hours 10/08/23 10/13/23  Joshua Debby CROME, MD  atorvastatin  (LIPITOR) 10 MG tablet Take 1 tablet (10 mg total) by mouth 3 (three) times a week.  08/17/23   Joshua Debby CROME, MD  celecoxib  (CELEBREX ) 100 MG capsule Take 1 capsule (100 mg total) by mouth 2 (two) times daily. 06/11/23 06/10/24  Magnant, Charles L, PA-C  finasteride  (PROSCAR ) 5 MG tablet Take 1 tablet (5 mg total) by mouth daily. Patient taking differently: Take 1 mg by mouth daily. 05/18/23   Joshua Debby CROME, MD  lisinopril  (ZESTRIL ) 10 MG tablet Take 1 tablet by mouth daily. 10/15/23   Joshua Debby CROME, MD  tadalafil  (CIALIS ) 20 MG tablet Take 1 tablet (20 mg total) by mouth daily as needed for erectile dysfunction. 04/22/23   Joshua Debby CROME, MD  tirzepatide  (MOUNJARO ) 15 MG/0.5ML Pen Inject 15 mg into the skin once a week. 10/07/23   Joshua Debby CROME, MD  zolpidem  (AMBIEN  CR) 6.25 MG CR tablet Take 1 tablet (6.25 mg total) by mouth at bedtime as needed for sleep. 01/21/23   Joshua Debby CROME, MD     Allergies: Sulfa  antibiotics   Review of Systems   ROS as per HPI  Physical Exam Updated Vital Signs BP (!) 147/84   Pulse 93   Temp 98 F (36.7 C) (Oral)   Resp 20   Ht 6' (1.829 m)   Wt 89 kg   SpO2 96%   BMI 26.61 kg/m  Physical Exam Vitals and nursing note reviewed.  Constitutional:      General: He is not in acute distress.    Appearance: He is well-developed.  HENT:     Head: Normocephalic and atraumatic.  Eyes:     Conjunctiva/sclera: Conjunctivae normal.  Cardiovascular:     Rate and Rhythm: Normal rate. Rhythm irregular.     Heart sounds: No murmur heard. Pulmonary:     Effort: Pulmonary effort is normal. No respiratory distress.     Breath sounds: Normal breath sounds.  Abdominal:     Palpations: Abdomen is soft.     Tenderness: There is no abdominal tenderness.  Musculoskeletal:        General: No swelling.     Cervical back: Neck supple.     Right lower leg: No edema.     Left lower leg: No edema.  Skin:    General: Skin is warm and dry.     Capillary Refill: Capillary refill takes less than 2 seconds.  Neurological:     Mental Status: He is  alert.  Psychiatric:        Mood and Affect: Mood normal.     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  Physical exam is pertinent for irregular rhythm without tachycardia.   The differential includes but is not limited to PVCs, atrial fibrillation, bigeminy, dehydration, electrolyte derangement, hypomagnesemia, hypokalemia, hyperthyroidism, hypothyroidism, heart failure, ACS.  Independent historian: None  External data reviewed: Notes: Reviewed patient's chart including phone notes with Dr. [place] office  Initial Plan:  Screening labs including CBC and Metabolic panel to evaluate for infectious or metabolic etiology of disease.  TSH to evaluate for thyroid  abnormality Magnesium  level to check for hypomagnesemia as etiology of dysrhythmia  Screening BMP for heart failure CXR to evaluate for structural/infectious intra-thoracic pathology.  EKG and single troponin to evaluate for cardiac pathology.  Single troponin given patient is chest pain-free. Objective evaluation as below reviewed   Labs: Ordered, Independent interpretation, and Details: CBC without leukocytosis or thrombocytopenia.  Anemia stable in comparison to prior.  CMP with hyperglycemia without anion gap elevation.  No emergent electrolyte derangement, AKI, emergent LFT abnormality.  No hyperkalemia.  Magnesium  WNL.  Troponin WNL at 16.  BNP WNL at 116. TSH WNL.  Radiology: Ordered, Independent interpretation, Details: Chest x-ray without focal airspace opacification, cardiomediastinal switcher Intran, pneumothorax, pleural effusion, bony derangement, and All images reviewed independently.  Agree with radiology report at this time.   DG Chest 2 View Result  Date: 12/20/2023 EXAM: 2 VIEW(S) XRAY OF THE CHEST 12/20/2023 08:51:00 AM COMPARISON: None available. CLINICAL HISTORY: Palpitations FINDINGS: LUNGS AND PLEURA: No focal pulmonary opacity. No pulmonary edema. No pleural effusion. No pneumothorax. HEART AND MEDIASTINUM: No acute abnormality of the cardiac and mediastinal silhouettes. BONES AND SOFT TISSUES: No acute osseous abnormality. IMPRESSION: 1. No acute cardiopulmonary process. Electronically signed by: Norleen Kil MD 12/20/2023 09:17 AM EST RP Workstation: [id number]    EKG/Medicine tests: Ordered and Independent interpretation EKG Interpretation: Sinus rhythm Multiple ventricular premature complexes Borderline low voltage, extremity leads PVC new from prior, no other acute changes Confirmed by Rogelia Satterfield ([id number]) on 12/20/2023 8:26:52 AM                Interventions: LR bolus  See the EMR for full details regarding lab and imaging results.  Patient presents to the emergency department for increasing frequency of palpitations.  EKG demonstrates PVCs without other signs of acute ischemia, patient without additional symptoms  on exam.  Well-appearing, vitally stable.  Do feel that patient warrants screening labs as well as chest x-ray.  These were obtained and reveal no acute abnormalities.  Doubt metabolic derangement precipitating PVCs given magnesium  and CMP WNL.  Doubt infectious etiology given patient without leukocytosis, fever, no localizing symptoms on exam.  Doubt severe dehydration given no metabolic derangement, AKI.  Doubt ACS given troponin WNL, doubt heart failure given BNP WNL.  Doubt thyroid  disease given TSH WNL.  Given patient concerns that he has mild dehydration, given 500 cc LR bolus.  On reevaluation, patient demonstrates decreased PVC burden on the monitor, and feels that he is symptomatically improved.  Offered that he receive additional half of fluid bolus, patient declines, which I feel is reasonable given he does not have  clinical evidence of severe dehydration.  Recommended follow-up with PCP and cardiologist, patient minimalist plan, discharged in stable condition.  Presentation is most consistent with acute complicated illness and I did consider and rule out acute life/limb-threatening illness  Discussion of management or test interpretations with external provider(s): Not indicated  Risk Drugs:Prescription drug management  Disposition: DISCHARGE: I believe that the patient is safe for discharge home with outpatient follow-up. Patient was informed of all pertinent physical exam, laboratory, and imaging findings.  Patient's suspected etiology of their symptom presentation was discussed with the patient and all questions were answered. We discussed following up with PCP and cardiology. I provided thorough ED return precautions. The patient feels safe and comfortable with this plan.  MDM generated using voice dictation software and may contain dictation errors.  Please contact me for any clarification or with any questions.  Clinical Impression:  1. PVC (premature ventricular contraction)      Discharge   Final Clinical Impression(s) / ED Diagnoses Final diagnoses:  PVC (premature ventricular contraction)    Rx / DC Orders ED Discharge Orders     None        Rogelia Jerilynn RAMAN, MD 12/20/23 [phone]

## 2023-12-20 NOTE — Discharge Instructions (Signed)
 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  You came to the Emergency Department today because you are having increased frequency of palpitations at home.  Here in the emergency department your EKG is consistent with PVCs.  Your chest x-ray did not show any acute abnormalities.  Your CBC did not demonstrate leukocytosis, your anemia is stable.  Your CMP does not demonstrate any AKI or emergent electrolyte derangement, your magnesium  is also within normal limits.  Your BNP and troponin are not elevated, therefore we do not feel that your PVCs are due to underlying heart failure or ischemia.  We did feel that your palpitations improved after fluids, therefore you may be mildly dehydrated.  We recommend drinking plenty of water over the next several days, and continue to follow-up outpatient with your PCP and cardiologist.  To-Do: 1. Please follow-up with your primary doctor within 1 - 2 weeks / as soon as possible.   Please return to the Emergency Department or call 911 if you experience have worsening of your symptoms, or do not get better, develop chest pain, shortness of breath, severe or significantly worsening pain, high fever, severe confusion, pass out or have any reason to think that you need emergency medical care.   We hope you feel better soon.   Mitzie Later, MD Department of Emergency Medicine [facility] [place]

## 2023-12-20 NOTE — ED Triage Notes (Signed)
 Patient to ED by POV from home with c/o palpitations. Chest discomfort started this morning along with flutters, Cardiac HX.

## 2023-12-21 ENCOUNTER — Encounter: Payer: Self-pay | Admitting: Radiology

## 2023-12-21 ENCOUNTER — Other Ambulatory Visit: Payer: Self-pay | Admitting: Orthopedic Surgery

## 2023-12-21 ENCOUNTER — Other Ambulatory Visit: Payer: Self-pay | Admitting: Internal Medicine

## 2023-12-21 DIAGNOSIS — R202 Paresthesia of skin: Secondary | ICD-10-CM

## 2023-12-21 DIAGNOSIS — I493 Ventricular premature depolarization: Secondary | ICD-10-CM | POA: Insufficient documentation

## 2023-12-23 ENCOUNTER — Telehealth: Payer: Self-pay

## 2023-12-23 NOTE — Transitions of Care (Post Inpatient/ED Visit) (Signed)
   12/23/2023  Name: Adam Ware, Dr. 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 DOB: 1950/03/18  Today's TOC FU Call Status: Today's TOC FU Call Status:: Successful TOC FU Call Completed TOC FU Call Complete Date: 12/23/23 Patient's Name and Date of Birth confirmed.  Transition Care Management Follow-up Telephone Call Date of Discharge: 12/20/23 Discharge Facility: [facility] Type of Discharge: Emergency Department Reason for ED Visit: Cardiac Conditions Cardiac Conditions Diagnosis:  (PVC) How have you been since you were released from the hospital?: Better Any questions or concerns?: No  Items Reviewed: Did you receive and understand the discharge instructions provided?: Yes Medications obtained,verified, and reconciled?: Yes (Medications Reviewed) Any new allergies since your discharge?: No Dietary orders reviewed?: No Do you have support at home?: No  Medications Reviewed Today: Medications Reviewed Today   Medications were not reviewed in this encounter     Home Care and Equipment/Supplies: Were Home Health Services Ordered?: NA Any new equipment or medical supplies ordered?: NA  Functional Questionnaire: Do you need assistance with bathing/showering or dressing?: No Do you need assistance with meal preparation?: No Do you need assistance with eating?: No Do you have difficulty maintaining continence: No Do you need assistance with getting out of bed/getting out of a chair/moving?: No Do you have difficulty managing or taking your medications?: No  Follow up appointments reviewed: PCP Follow-up appointment confirmed?: NA (referral was placed for pt) Specialist Hospital Follow-up appointment confirmed?: NA Do you need transportation to your follow-up appointment?: No Do you understand care options if your condition(s) worsen?: Yes-patient verbalized understanding    SIGNATURE Ronnald Palms

## 2023-12-24 ENCOUNTER — Other Ambulatory Visit: Payer: Self-pay | Admitting: Medical

## 2023-12-24 DIAGNOSIS — I493 Ventricular premature depolarization: Secondary | ICD-10-CM

## 2023-12-24 DIAGNOSIS — R002 Palpitations: Secondary | ICD-10-CM

## 2023-12-24 NOTE — Telephone Encounter (Signed)
 Patient states that he has his appointment scheduled with Cardiology already. No further questions at this time. He will discuss everything with them.

## 2023-12-24 NOTE — Progress Notes (Signed)
 Recent c/o palpitations, ED visit for same, EKG showing PVCs, patient spoke to cardiology who recommended echo

## 2023-12-25 ENCOUNTER — Telehealth: Payer: Self-pay

## 2023-12-25 NOTE — Telephone Encounter (Signed)
 Left voice message to call back 11/7

## 2023-12-25 NOTE — Telephone Encounter (Signed)
-----   Message from Lynwood Schilling sent at 12/25/2023  7:53 AM EST ----- Regarding: FW: me This is the patient that needs the echo and TSH if they have not been ordered.  He is on my schedule for next month. ----- Message ----- From: Sanjuan Aleck SAILOR, RN Sent: 12/22/2023   3:05 PM EST To: Lynwood Schilling, MD Subject: FW: me                                         Please attach the pt or provide a birth date so I can confirm I have the right pt. Thank you! ----- Message ----- From: Schilling Lynwood, MD Sent: 12/20/2023  10:20 AM EST To: Asberry VEAR mallory Danney Debby LITTIE Molt, MD; Car# Subject: FW: me                                         Please make sure he has a TSH ordered by his PCP and please order an echo for palpitations.  thanks ----- Message ----- From: Joyce Norleen BROCKS, MD Sent: 12/17/2023  10:56 AM EST To: Lynwood Schilling, MD Subject: me                                             Good morning.  I just put an EKG in the system on me.  Too damn many PVCs!  What you want me to do next Norleen Joyce

## 2024-01-08 ENCOUNTER — Ambulatory Visit: Admitting: Physical Medicine and Rehabilitation

## 2024-01-08 DIAGNOSIS — R202 Paresthesia of skin: Secondary | ICD-10-CM

## 2024-01-08 NOTE — Progress Notes (Unsigned)
 Pain Scale   Average Pain 0 Patient advising he has numbness, tingling in his right hand. Patient is left hand dominate        +Driver, -BT, -Dye Allergies.

## 2024-01-11 ENCOUNTER — Other Ambulatory Visit (HOSPITAL_COMMUNITY): Payer: Self-pay

## 2024-01-11 ENCOUNTER — Other Ambulatory Visit: Payer: Self-pay

## 2024-01-11 ENCOUNTER — Ambulatory Visit: Payer: Self-pay | Admitting: Medical

## 2024-01-11 ENCOUNTER — Ambulatory Visit (HOSPITAL_COMMUNITY)
Admission: RE | Admit: 2024-01-11 | Discharge: 2024-01-11 | Disposition: A | Discharge status: Home / Self Care | Source: Ambulatory Visit | Attending: Medical | Admitting: Medical

## 2024-01-11 DIAGNOSIS — E119 Type 2 diabetes mellitus without complications: Secondary | ICD-10-CM

## 2024-01-11 DIAGNOSIS — I517 Cardiomegaly: Secondary | ICD-10-CM | POA: Diagnosis not present

## 2024-01-11 DIAGNOSIS — I371 Nonrheumatic pulmonary valve insufficiency: Secondary | ICD-10-CM | POA: Diagnosis not present

## 2024-01-11 DIAGNOSIS — I351 Nonrheumatic aortic (valve) insufficiency: Secondary | ICD-10-CM

## 2024-01-11 DIAGNOSIS — I493 Ventricular premature depolarization: Secondary | ICD-10-CM | POA: Insufficient documentation

## 2024-01-11 DIAGNOSIS — I083 Combined rheumatic disorders of mitral, aortic and tricuspid valves: Secondary | ICD-10-CM | POA: Diagnosis not present

## 2024-01-11 DIAGNOSIS — R002 Palpitations: Secondary | ICD-10-CM

## 2024-01-11 LAB — ECHOCARDIOGRAM COMPLETE
AR max vel: 2.27 cm2
AV Area VTI: 2.48 cm2
AV Area mean vel: 2.56 cm2
AV Mean grad: 5 mmHg
AV Peak grad: 10 mmHg
Ao pk vel: 1.58 m/s
Area-P 1/2: 4.08 cm2
Calc EF: 57.2 %
MV VTI: 3.62 cm2
S' Lateral: 3.6 cm
Single Plane A2C EF: 59.2 %
Single Plane A4C EF: 59.4 %

## 2024-01-11 MED ORDER — MOUNJARO 15 MG/0.5ML ~~LOC~~ SOAJ
15.0000 mg | SUBCUTANEOUS | 0 refills | Status: DC
  Filled 2024-01-11 (×2): qty 6, 84d supply, fill #0

## 2024-01-11 NOTE — Progress Notes (Signed)
 Results to MyChart

## 2024-01-12 NOTE — Procedures (Signed)
 EMG & NCV Findings: Evaluation of the right median motor nerve showed prolonged distal onset latency (6.4 ms) and decreased conduction velocity (Elbow-Wrist, 48 m/s).  The right median (across palm) sensory nerve showed no response (Palm), prolonged distal peak latency (8.7 ms), and reduced amplitude (7.5 V).  The right ulnar sensory nerve showed reduced amplitude (8.6 V).  All remaining nerves (as indicated in the following tables) were within normal limits.    All examined muscles (as indicated in the following table) showed no evidence of electrical instability.    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   There is no significant electrodiagnostic evidence of any other focal nerve entrapment, brachial plexopathy, cervical radiculopathy. Hint but not diagnostic underlying sensory axonal neuropathy.  Recommendations: 1.  Follow-up with referring physician. 2.  Continue current management of symptoms. 3.  Suggest surgical evaluation.  ___________________________ Prentice Masters FAAPMR Board Certified, American Board of Physical Medicine and Rehabilitation    Nerve Conduction Studies Anti Sensory Summary Table   Stim Site NR Peak (ms) Norm Peak (ms) P-T Amp (V) Norm P-T Amp Site1 Site2 Delta-P (ms) Dist (cm) Vel (m/s) Norm Vel (m/s)  Right Median Acr Palm Anti Sensory (2nd Digit)  31.1C  Wrist    *8.7 <3.6 *7.5 >10 Wrist Palm  0.0    Palm *NR  <2.0          Right Radial Anti Sensory (Base 1st Digit)  31.7C  Wrist    2.3 <3.1 10.9  Wrist Base 1st Digit 2.3 0.0    Right Ulnar Anti Sensory (5th Digit)  32.2C  Wrist    3.1 <3.7 *8.6 >15.0 Wrist 5th Digit 3.1 14.0 45 >38  B Elbow    0.7  148.4  B Elbow Wrist 2.4 0.0  >47   Motor Summary Table   Stim Site NR Onset (ms) Norm Onset (ms) O-P Amp (mV) Norm O-P Amp Site1  Site2 Delta-0 (ms) Dist (cm) Vel (m/s) Norm Vel (m/s)  Right Median Motor (Abd Poll Brev)  32.4C  Wrist    *6.4 <4.2 6.2 >5 Elbow Wrist 5.3 25.3 *48 >50  Elbow    11.7  2.2         Right Ulnar Motor (Abd Dig Min)  32.5C  Wrist    3.0 <4.2 8.1 >3 B Elbow Wrist 4.3 23.0 53 >53  B Elbow    7.3  5.8  A Elbow B Elbow 1.8 10.0 56 >53  A Elbow    9.1  7.3          EMG   Side Muscle Nerve Root Ins Act Fibs Psw Amp Dur Poly Recrt Int Bruna Comment  Right Abd Poll Brev Median C8-T1 Nml Nml Nml Nml Nml 0 Nml Nml   Right 1stDorInt Ulnar C8-T1 Nml Nml Nml Nml Nml 0 Nml Nml   Right PronatorTeres Median C6-7 Nml Nml Nml Nml Nml 0 Nml Nml   Right Biceps Musculocut C5-6 Nml Nml Nml Nml Nml 0 Nml Nml     Nerve Conduction Studies Anti Sensory Left/Right Comparison   Stim Site L Lat (ms) R Lat (ms) L-R Lat (ms) L Amp (V) R Amp (V) L-R Amp (%) Site1 Site2 L Vel (m/s) R Vel (m/s) L-R Vel (m/s)  Median Acr Palm Anti Sensory (2nd Digit)  31.1C  Wrist  *8.7   *7.5  Wrist  Palm     Palm             Radial Anti Sensory (Base 1st Digit)  31.7C  Wrist  2.3   10.9  Wrist Base 1st Digit     Ulnar Anti Sensory (5th Digit)  32.2C  Wrist  3.1   *8.6  Wrist 5th Digit  45   B Elbow  0.7   148.4  B Elbow Wrist      Motor Left/Right Comparison   Stim Site L Lat (ms) R Lat (ms) L-R Lat (ms) L Amp (mV) R Amp (mV) L-R Amp (%) Site1 Site2 L Vel (m/s) R Vel (m/s) L-R Vel (m/s)  Median Motor (Abd Poll Brev)  32.4C  Wrist  *6.4   6.2  Elbow Wrist  *48   Elbow  11.7   2.2        Ulnar Motor (Abd Dig Min)  32.5C  Wrist  3.0   8.1  B Elbow Wrist  53   B Elbow  7.3   5.8  A Elbow B Elbow  56   A Elbow  9.1   7.3           Waveforms:

## 2024-01-12 NOTE — Progress Notes (Signed)
 Norleen JAYSON Jobs, Dr. - 73 y.o. 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 Anshul Agarwala for evaluation and management of chronic, worsening and severe pain, numbness and tingling in the Right upper extremities.  Patient is Left hand dominant. Prior left sided study is in the chart but was post surgical release.    ROS Otherwise per HPI.  Assessment & Plan: Visit Diagnoses:    ICD-10-CM   1. 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   There is no significant electrodiagnostic evidence of any other focal nerve entrapment, brachial plexopathy, cervical radiculopathy. Hint but not diagnostic underlying sensory axonal neuropathy.  Recommendations: 1.  Follow-up with referring physician. 2.  Continue current management of symptoms. 3.  Suggest surgical evaluation.  Meds & Orders: No orders of the defined types were placed in this encounter.   Orders Placed This Encounter  Procedures   NCV with EMG (electromyography)    Follow-up: Return for Anshul Agarwala, MD.   Procedures: No procedures performed  EMG & NCV Findings: Evaluation of the right median motor nerve showed prolonged distal onset latency (6.4 ms) and decreased conduction velocity (Elbow-Wrist, 48 m/s).  The right median (across palm) sensory nerve showed no response (Palm), prolonged distal peak latency (8.7 ms), and reduced amplitude (7.5 V).  The right ulnar sensory nerve showed reduced amplitude (8.6  V).  All remaining nerves (as indicated in the following tables) were within normal limits.    All examined muscles (as indicated in the following table) showed no evidence of electrical instability.    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   There is no significant electrodiagnostic evidence of any other focal nerve entrapment, brachial plexopathy, cervical radiculopathy. Hint but not diagnostic underlying sensory axonal neuropathy.  Recommendations: 1.  Follow-up with referring physician. 2.  Continue current management of symptoms. 3.  Suggest surgical evaluation.  ___________________________ Prentice Masters FAAPMR Board Certified, American Board of Physical Medicine and Rehabilitation    Nerve Conduction Studies Anti Sensory Summary Table   Stim Site NR Peak (ms) Norm Peak (ms) P-T Amp (V) Norm P-T Amp Site1 Site2 Delta-P (ms) Dist (cm) Vel (m/s) Norm Vel (m/s)  Right Median Acr Palm Anti Sensory (2nd Digit)  31.1C  Wrist    *8.7 <3.6 *7.5 >10 Wrist Palm  0.0    Palm *NR  <2.0          Right Radial Anti Sensory (Base 1st Digit)  31.7C  Wrist    2.3 <3.1 10.9  Wrist Base 1st Digit 2.3 0.0    Right Ulnar Anti Sensory (5th Digit)  32.2C  Wrist    3.1 <3.7 *8.6 >15.0 Wrist 5th Digit 3.1 14.0 45 >38  B Elbow    0.7  148.4  B Elbow Wrist 2.4 0.0  >47   Motor Summary Table  Stim Site NR Onset (ms) Norm Onset (ms) O-P Amp (mV) Norm O-P Amp Site1 Site2 Delta-0 (ms) Dist (cm) Vel (m/s) Norm Vel (m/s)  Right Median Motor (Abd Poll Brev)  32.4C  Wrist    *6.4 <4.2 6.2 >5 Elbow Wrist 5.3 25.3 *48 >50  Elbow    11.7  2.2         Right Ulnar Motor (Abd Dig Min)  32.5C  Wrist    3.0 <4.2 8.1 >3 B Elbow Wrist 4.3 23.0 53 >53  B Elbow    7.3  5.8  A Elbow B Elbow 1.8 10.0 56 >53  A Elbow    9.1  7.3           EMG   Side Muscle Nerve Root Ins Act Fibs Psw Amp Dur Poly Recrt Int Bruna Comment  Right Abd Poll Brev Median C8-T1 Nml Nml Nml Nml Nml 0 Nml Nml   Right 1stDorInt Ulnar C8-T1 Nml Nml Nml Nml Nml 0 Nml Nml   Right PronatorTeres Median C6-7 Nml Nml Nml Nml Nml 0 Nml Nml   Right Biceps Musculocut C5-6 Nml Nml Nml Nml Nml 0 Nml Nml     Nerve Conduction Studies Anti Sensory Left/Right Comparison   Stim Site L Lat (ms) R Lat (ms) L-R Lat (ms) L Amp (V) R Amp (V) L-R Amp (%) Site1 Site2 L Vel (m/s) R Vel (m/s) L-R Vel (m/s)  Median Acr Palm Anti Sensory (2nd Digit)  31.1C  Wrist  *8.7   *7.5  Wrist Palm     Palm             Radial Anti Sensory (Base 1st Digit)  31.7C  Wrist  2.3   10.9  Wrist Base 1st Digit     Ulnar Anti Sensory (5th Digit)  32.2C  Wrist  3.1   *8.6  Wrist 5th Digit  45   B Elbow  0.7   148.4  B Elbow Wrist      Motor Left/Right Comparison   Stim Site L Lat (ms) R Lat (ms) L-R Lat (ms) L Amp (mV) R Amp (mV) L-R Amp (%) Site1 Site2 L Vel (m/s) R Vel (m/s) L-R Vel (m/s)  Median Motor (Abd Poll Brev)  32.4C  Wrist  *6.4   6.2  Elbow Wrist  *48   Elbow  11.7   2.2        Ulnar Motor (Abd Dig Min)  32.5C  Wrist  3.0   8.1  B Elbow Wrist  53   B Elbow  7.3   5.8  A Elbow B Elbow  56   A Elbow  9.1   7.3           Waveforms:            Clinical History: MRI LUMBAR SPINE WITHOUT CONTRAST   TECHNIQUE:  Multiplanar, multisequence MR imaging of the lumbar spine was  performed. No intravenous contrast was administered.   COMPARISON: Lumbar spine radiographs 04/21/2017. CT abdomen and  pelvis 03/25/2014.   FINDINGS:  Segmentation: Standard.   Alignment: 8 mm anterolisthesis of L5 on S1 due to bilateral L5 pars  defects.   Vertebrae: No acute fracture or suspicious osseous lesion. Mild  left-sided L4 inferior endplate edema, likely degenerative with a  small Schmorl's node also present in this location.   Conus medullaris and cauda equina: Conus  extends to the L1-2 level.  Conus and cauda equina appear normal.   Paraspinal and  other soft tissues: Unremarkable.   Disc levels:   L1-2: Negative.   L2-3: Mild disc desiccation. Minimal disc bulging without stenosis.   L3-4: Mild disc desiccation. Minimal disc bulging and mild facet  arthrosis without stenosis.   L4-5: Mild disc desiccation. Mild disc bulging and moderate facet  hypertrophy result in mild left neural foraminal stenosis without  spinal stenosis. A 5 x 1 mm cyst is noted along the left ligamentum  flavum, not resulting in neural impingement.   L5-S1: Disc desiccation and severe disc space narrowing.  Anterolisthesis, disc space height loss, and bulging uncovered disc  result in severe bilateral neural foraminal stenosis with L5 nerve  root compression. No spinal stenosis.   IMPRESSION:  1. Bilateral L5 pars defects with grade 1 anterolisthesis, advanced  disc degeneration, and severe bilateral neural foraminal stenosis  with L5 nerve root compression.  2. Mild disc bulging and moderate facet hypertrophy at L4-5  resulting in mild left neural foraminal stenosis.    Electronically Signed  By: Dasie Hamburg M.D.  On: 05/01/2017 17:09     Objective:  VS:  HT:    WT:   BMI:     BP:   HR: bpm  TEMP: ( )  RESP:  Physical Exam Musculoskeletal:        General: Tenderness present.     Comments: Inspection reveals no atrophy of the bilateral APB or FDI or hand intrinsics. There is no swelling, color changes, allodynia or dystrophic changes. There is 5 out of 5 strength in the bilateral wrist extension, finger abduction and long finger flexion. Subjection impaired sensation in the left more than right median nerve distribution.   There is a negative Hoffmann's test bilaterally.  Skin:    General: Skin is warm and dry.     Findings: No erythema or rash.  Neurological:     General: No focal deficit present.     Mental Status: He is alert and oriented to person,  place, and time.     Cranial Nerves: No cranial nerve deficit.     Sensory: No sensory deficit.     Motor: No weakness or abnormal muscle tone.     Coordination: Coordination normal.     Gait: Gait normal.  Psychiatric:        Mood and Affect: Mood normal.        Behavior: Behavior normal.        Thought Content: Thought content normal.      Imaging: No results found.

## 2024-01-13 ENCOUNTER — Other Ambulatory Visit (HOSPITAL_BASED_OUTPATIENT_CLINIC_OR_DEPARTMENT_OTHER): Payer: Self-pay

## 2024-01-18 ENCOUNTER — Encounter: Payer: Self-pay | Admitting: Internal Medicine

## 2024-01-18 ENCOUNTER — Ambulatory Visit: Admitting: Internal Medicine

## 2024-01-18 VITALS — BP 134/78 | HR 86 | Temp 97.8°F | Resp 16 | Ht 72.0 in | Wt 197.8 lb

## 2024-01-18 DIAGNOSIS — E119 Type 2 diabetes mellitus without complications: Secondary | ICD-10-CM

## 2024-01-18 DIAGNOSIS — D539 Nutritional anemia, unspecified: Secondary | ICD-10-CM

## 2024-01-18 LAB — CBC WITH DIFFERENTIAL/PLATELET
Basophils Absolute: 0 K/uL (ref 0.0–0.1)
Basophils Relative: 0.3 % (ref 0.0–3.0)
Eosinophils Absolute: 0.1 K/uL (ref 0.0–0.7)
Eosinophils Relative: 1.4 % (ref 0.0–5.0)
HCT: 41.1 % (ref 39.0–52.0)
Hemoglobin: 13.4 g/dL (ref 13.0–17.0)
Lymphocytes Relative: 36.6 % (ref 12.0–46.0)
Lymphs Abs: 2.6 K/uL (ref 0.7–4.0)
MCHC: 32.7 g/dL (ref 30.0–36.0)
MCV: 87 fl (ref 78.0–100.0)
Monocytes Absolute: 0.5 K/uL (ref 0.1–1.0)
Monocytes Relative: 6.6 % (ref 3.0–12.0)
Neutro Abs: 4 K/uL (ref 1.4–7.7)
Neutrophils Relative %: 55.1 % (ref 43.0–77.0)
Platelets: 296 K/uL (ref 150.0–400.0)
RBC: 4.72 Mil/uL (ref 4.22–5.81)
RDW: 13.7 % (ref 11.5–15.5)
WBC: 7.2 K/uL (ref 4.0–10.5)

## 2024-01-18 LAB — VITAMIN B12: Vitamin B-12: 298 pg/mL (ref 211–911)

## 2024-01-18 LAB — FOLATE: Folate: 9.4 ng/mL (ref >5.9)

## 2024-01-18 LAB — HEMOGLOBIN A1C: Hgb A1c MFr Bld: 5.9 % (ref 4.6–6.5)

## 2024-01-18 NOTE — Patient Instructions (Signed)

## 2024-01-18 NOTE — Progress Notes (Unsigned)
 Subjective:  Patient ID: Adam Ware, Dr., male    DOB: 29-Dec-1950  Age: 73 y.o. 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  CC: Surgical Clearance (Surgery is 02/04/2024. ), Anemia, Hypertension, and Diabetes   HPI Adam Ware, Dr. presents for f/up ---  Discussed the use of AI scribe software for clinical note transcription with the patient, who gave verbal consent to proceed.  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  He has been experiencing intermittent PVCs since early October, initially described as 'flip flops'. An EKG confirmed the presence of PVCs. He visited the ER due to the frequency of these episodes, which he found concerning. The PVCs occur intermittently, with occasional sensations he describes as 'interesting' but not alarming. No associated chest pain or shortness of breath.  During his ER visit, a hemoglobin level of 12 was noted. No symptoms of anemia such as gastrointestinal bleeding or abdominal pain.  He has a history of paresthesias, specifically in the right upper extremity, and mentions previous symptoms on the left side following a procedure. Past issues with L5 were treated with epidurals, resulting in paresthesia and dysesthesia, but these symptoms have resolved. No current foot drop.  He stopped taking metformin  after his A1c was 6.0 this summer. He does not regularly take B12 or other supplements.  He is not currently taking aspirin  or blood thinners and is uncertain about whether he should resume them given his age of 23.     Outpatient Medications Prior to Visit  Medication Sig Dispense Refill   atorvastatin  (LIPITOR) 10 MG tablet Take 1 tablet (10 mg total) by mouth 3 (three) times a week. 90 tablet 1   finasteride  (PROSCAR ) 5 MG tablet Take 1 tablet (5 mg total) by mouth daily. (Patient taking differently: Take 1 mg by mouth daily.) 90 tablet 1   lisinopril  (ZESTRIL )  10 MG tablet Take 1 tablet by mouth daily. 90 tablet 0   tadalafil  (CIALIS ) 20 MG tablet Take 1 tablet (20 mg total) by mouth daily as needed for erectile dysfunction. 30 tablet 1   tirzepatide  (MOUNJARO ) 15 MG/0.5ML Pen Inject 15 mg into the skin once a week. 6 mL 0   zolpidem  (AMBIEN  CR) 6.25 MG CR tablet Take 1 tablet (6.25 mg total) by mouth at bedtime as needed for sleep. 90 tablet 0   acetaZOLAMIDE  (DIAMOX ) 250 MG tablet Take 1 tablet (250 mg total) by mouth 2 (two) times daily for 5 days. Start 48 hours before ascending and continue for 72 hours 10 tablet 0   celecoxib  (CELEBREX ) 100 MG capsule Take 1 capsule (100 mg total) by mouth 2 (two) times daily. 60 capsule 0   No facility-administered medications prior to visit.    ROS Review of Systems  Objective:  BP 134/78 (BP Location: Left Arm, Patient Position: Sitting, Cuff Size: Normal)   Pulse 86   Temp 97.8 F (36.6 C) (Oral)   Resp 16   Ht 6' (1.829 m)   Wt 197 lb 12.8 oz (89.7 kg)   SpO2 94%   BMI 26.83 kg/m   BP Readings from Last 3 Encounters:  01/18/24 134/78  12/20/23 118/86  08/17/23 130/68    Wt Readings from Last 3 Encounters:  01/18/24 197 lb 12.8 oz (89.7 kg)  12/20/23 196 lb 3.4 oz (89 kg)  08/17/23 194 lb (88 kg)    Physical Exam  Lab Results  Component Value Date  WBC 6.8 12/20/2023   HGB 12.6 (L) 12/20/2023   HCT 40.8 12/20/2023   PLT 256 12/20/2023   GLUCOSE 219 (H) 12/20/2023   CHOL 121 08/17/2023   TRIG 146.0 08/17/2023   HDL 33.90 (L) 08/17/2023   LDLDIRECT 59.0 05/05/2018   LDLCALC 58 08/17/2023   ALT 24 12/20/2023   AST 25 12/20/2023   NA 141 12/20/2023   K 4.2 12/20/2023   CL 107 12/20/2023   CREATININE 0.63 12/20/2023   BUN 14 12/20/2023   CO2 25 12/20/2023   TSH 0.956 12/20/2023   PSA 0.31 12/11/2021   INR 1.1 04/13/2023   HGBA1C 6.0 08/17/2023   MICROALBUR 1.2 08/17/2023    ECHOCARDIOGRAM COMPLETE Result Date: 01/11/2024    ECHOCARDIOGRAM REPORT   Patient Name:    Adam Ware Date of Exam: 01/11/2024 Medical Rec #:  [id number]      Height:       72.0 in Accession #:    [id number]     Weight:       196.2 lb Date of Birth:  [birth year]      BSA:          2.113 m Patient Age:    73 years       BP:           163/85 mmHg Patient Gender: M              HR:           80 bpm. Exam Location:  Outpatient Procedure: 2D Echo (Both Spectral and Color Flow Doppler were utilized during            procedure). Indications:    Palpitations, PVC's  History:        Patient has no prior history of Echocardiogram examinations.  Sonographer:    Charmaine Gaskins Referring Phys: [phone] DAVID S TYSINGER  Sonographer Comments: Global longitudinal strain was attempted. IMPRESSIONS  1. Left ventricular ejection fraction, by estimation, is 55 to 60%. The left ventricle has normal function. The left ventricle has no regional wall motion abnormalities. There is mild concentric left ventricular hypertrophy. Left ventricular diastolic parameters are consistent with Grade I diastolic dysfunction (impaired relaxation).  2. Right ventricular systolic function is normal. The right ventricular size is normal. There is normal pulmonary artery systolic pressure.  3. Left atrial size was mildly dilated.  4. Mitral valve leaflets are thickened with mobile densities on the anterior leaflet and posterior annulus. Likely mobile mitral annular calcification. Suggest clinical correlation for endocarditis risk. The mitral valve is degenerative. Moderate mitral  valve regurgitation. No evidence of mitral stenosis.  5. Aortic regurgitation pressure half-time 535 msec. The aortic valve is tricuspid. Aortic valve regurgitation is mild. No aortic stenosis is present. Aortic valve area, by VTI measures 2.48 cm. Aortic valve mean gradient measures 5.0 mmHg. Aortic valve Vmax measures 1.58 m/s.  6. The inferior vena cava is normal in size with greater than 50% respiratory variability, suggesting right atrial pressure of 3 mmHg.  FINDINGS  Left Ventricle: Left ventricular ejection fraction, by estimation, is 55 to 60%. The left ventricle has normal function. The left ventricle has no regional wall motion abnormalities. The left ventricular internal cavity size was normal in size. There is  mild concentric left ventricular hypertrophy. Left ventricular diastolic parameters are consistent with Grade I diastolic dysfunction (impaired relaxation). Indeterminate filling pressures. Right Ventricle: The right ventricular size is normal. No increase in right ventricular wall thickness. Right ventricular systolic function  is normal. There is normal pulmonary artery systolic pressure. The tricuspid regurgitant velocity is 2.72 m/s, and  with an assumed right atrial pressure of 3 mmHg, the estimated right ventricular systolic pressure is 32.6 mmHg. Left Atrium: Left atrial size was mildly dilated. Right Atrium: Right atrial size was normal in size. Pericardium: There is no evidence of pericardial effusion. Mitral Valve: Mitral valve leaflets are thickened with mobile densities on the anterior leaflet and posterior annulus. Likely mobile mitral annular calcification. Suggest clinical correlation for endocarditis risk. The mitral valve is degenerative in appearance. There is mild thickening of the mitral valve leaflet(s). Moderate mitral valve regurgitation. No evidence of mitral valve stenosis. MV peak gradient, 3.2 mmHg. The mean mitral valve gradient is 1.0 mmHg. Tricuspid Valve: The tricuspid valve is normal in structure. Tricuspid valve regurgitation is mild . No evidence of tricuspid stenosis. Aortic Valve: Aortic regurgitation pressure half-time 535 msec. The aortic valve is tricuspid. Aortic valve regurgitation is mild. No aortic stenosis is present. Aortic valve mean gradient measures 5.0 mmHg. Aortic valve peak gradient measures 10.0 mmHg.  Aortic valve area, by VTI measures 2.48 cm. Pulmonic Valve: The pulmonic valve was normal in structure.  Pulmonic valve regurgitation is mild. No evidence of pulmonic stenosis. Aorta: The aortic root is normal in size and structure. Venous: The inferior vena cava is normal in size with greater than 50% respiratory variability, suggesting right atrial pressure of 3 mmHg. IAS/Shunts: No atrial level shunt detected by color flow Doppler.  LEFT VENTRICLE PLAX 2D LVIDd:         5.40 cm      Diastology LVIDs:         3.60 cm      LV e' medial:    5.77 cm/s LV PW:         1.10 cm      LV E/e' medial:  10.8 LV IVS:        1.10 cm      LV e' lateral:   8.81 cm/s LVOT diam:     2.14 cm      LV E/e' lateral: 7.1 LV SV:         75 LV SV Index:   35 LVOT Area:     3.60 cm  LV Volumes (MOD) LV vol d, MOD A2C: 94.8 ml LV vol d, MOD A4C: 115.0 ml LV vol s, MOD A2C: 38.7 ml LV vol s, MOD A4C: 46.7 ml LV SV MOD A2C:     56.1 ml LV SV MOD A4C:     115.0 ml LV SV MOD BP:      59.9 ml RIGHT VENTRICLE RV Basal diam:  3.21 cm RV Mid diam:    3.05 cm RV S prime:     14.90 cm/s LEFT ATRIUM             Index        RIGHT ATRIUM           Index LA diam:        3.36 cm 1.59 cm/m   RA Area:     18.80 cm LA Vol (A2C):   86.1 ml 40.74 ml/m  RA Volume:   49.50 ml  23.42 ml/m LA Vol (A4C):   65.2 ml 30.88 ml/m LA Biplane Vol: 84.4 ml 39.94 ml/m  AORTIC VALVE AV Area (Vmax):    2.27 cm AV Area (Vmean):   2.56 cm AV Area (VTI):     2.48 cm AV Vmax:  158.00 cm/s AV Vmean:          106.000 cm/s AV VTI:            0.302 m AV Peak Grad:      10.0 mmHg AV Mean Grad:      5.0 mmHg LVOT Vmax:         99.80 cm/s LVOT Vmean:        75.500 cm/s LVOT VTI:          0.208 m LVOT/AV VTI ratio: 0.69  AORTA Ao Root diam: 3.56 cm Ao Asc diam:  3.86 cm MITRAL VALVE               TRICUSPID VALVE MV Area (PHT): 4.08 cm    TR Peak grad:   29.6 mmHg MV Area VTI:   3.62 cm    TR Vmax:        272.00 cm/s MV Peak grad:  3.2 mmHg MV Mean grad:  1.0 mmHg    SHUNTS MV Vmax:       0.90 m/s    Systemic VTI:  0.21 m MV Vmean:      54.6 cm/s   Systemic Diam: 2.14  cm MV Decel Time: 186 msec MV E velocity: 62.40 cm/s MV A velocity: 86.50 cm/s MV E/A ratio:  0.72 Annabella Scarce MD Electronically signed by Annabella Scarce MD Signature Date/Time: 01/11/2024/11:27:06 AM    Final     Assessment & Plan:  Type 2 diabetes mellitus without complication, without long-term current use of insulin  (HCC) -     Hemoglobin A1c; Future  Deficiency anemia -     IBC + Ferritin; Future -     CBC with Differential/Platelet; Future -     Vitamin B1; Future -     Zinc; Future -     Vitamin B12; Future -     Folate; Future -     Methylmalonic acid, serum; Future -     Reticulocytes; Future     Follow-up: No follow-ups on file.  Debby Molt, MD

## 2024-01-19 ENCOUNTER — Other Ambulatory Visit: Payer: Self-pay

## 2024-01-19 DIAGNOSIS — Z9889 Other specified postprocedural states: Secondary | ICD-10-CM

## 2024-01-19 LAB — IBC + FERRITIN
Ferritin: 62.7 ng/mL (ref 22.0–322.0)
Iron: 71 ug/dL (ref 42–165)
Saturation Ratios: 20.7 % (ref 20.0–50.0)
TIBC: 343 ug/dL (ref 250.0–450.0)
Transferrin: 245 mg/dL (ref 212.0–360.0)

## 2024-01-22 ENCOUNTER — Ambulatory Visit: Admitting: Cardiology

## 2024-01-23 ENCOUNTER — Ambulatory Visit: Payer: Self-pay | Admitting: Internal Medicine

## 2024-01-23 LAB — ZINC: Zinc: 45 ug/dL — ABNORMAL LOW (ref 60–130)

## 2024-01-23 LAB — METHYLMALONIC ACID, SERUM: Methylmalonic Acid, Quant: 266 nmol/L (ref 69–390)

## 2024-01-23 LAB — VITAMIN B1: Vitamin B1 (Thiamine): 8 nmol/L (ref 8–30)

## 2024-01-23 LAB — RETICULOCYTES
ABS Retic: 76160 {cells}/uL (ref 25000–90000)
Retic Ct Pct: 1.6 %

## 2024-01-24 ENCOUNTER — Encounter: Payer: Self-pay | Admitting: Cardiology

## 2024-01-24 NOTE — Progress Notes (Unsigned)
 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  He was in the ED for this on Dec 20, 2023.  He was driving to [place] .  He felt his heart racing or skipping.  He felt like it might be a PVC with compensatory pause.  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  In retrospect today he did have an episode of syncope about 3 to 4 months ago.  He was in his kitchen.  He had not changed positions suddenly.  He was just standing there and he passed out.  He lives alone so was unwitnessed.  He did not harm himself.  He did not seek medical care.  He has not done that before.  He does not describe any orthostatic symptoms typically.  He does not have chest pressure, neck or arm discomfort.  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  He walks for exercise.  He golfs.        He did wear a monitor in 2023 and was found to have PACs.  I reviewed this monitor for this meeting.  I did review this monitor and note that he had a 3-second pause at about 831 morning.  He does not actually recall wearing the monitor.  He did not remember the symptoms that prompted that.  He never had needed any follow-up or referral apparently based on that.   Past Medical History:  Diagnosis Date   Adrenal tumor    rigth   Arthritis    Cancer (HCC)    basal cell   Diabetes mellitus without complication (HCC)    type 2; tx metformin  and wkly ozempic  on Sundays   History of kidney stones    surgery to remove   Hyperlipidemia    tx w/atorvastatin    Hypertension    tx w/lisinopril    Insomnia    tx w/ambien     Past Surgical History:  Procedure  Laterality Date   APPENDECTOMY     COLONOSCOPY     I & D KNEE WITH POLY EXCHANGE Right 01/24/2022   Procedure: IRRIGATION AND DEBRIDEMENT RIGHT KNEE WITH POLY EXCHANGE;  Surgeon: Harden Jerona GAILS, MD;  Location: [facility] OR;  Service: Orthopedics;  Laterality: Right;   IR URETERAL STENT LEFT NEW ACCESS W/O SEP NEPHROSTOMY CATH  04/13/2023   LITHOTRIPSY     MINOR CARPAL TUNNEL Left 11/07/2022   Procedure: MINOR CARPAL TUNNEL;  Surgeon: Arlinda Buster, MD;  Location: [facility];  Service: Orthopedics;  Laterality: Left;  NEEDS RNFA (OPEN)   NEPHROLITHOTOMY Left 04/13/2023   Procedure: LEFT NEPHROLITHOTOMY PERCUTANEOUS;  Surgeon: Carolee Sherwood JONETTA DOUGLAS, MD;  Location: [facility];  Service: Urology;  Laterality: Left;  150 MINUTE CASE   POSTERIOR LUMBAR FUSION 2 WITH HARDWARE REMOVAL Left 06/11/2023   Procedure: ARTHROSCOPY, SHOULDER WITH DEBRIDEMENT;  Surgeon: Addie Cordella Hamilton, MD;  Location: [facility] OR;  Service: Orthopedics;  Laterality: Left;  LEFT SHOULDER ARTHROSCOPY, DEBRIDEMENT   REPLACEMENT TOTAL KNEE Right    SHOULDER OPEN ROTATOR CUFF REPAIR Left 06/11/2023   Procedure: REPAIR, ROTATOR CUFF, OPEN;  Surgeon: Addie,  Cordella Hamilton, MD;  Location: [facility] OR;  Service: Orthopedics;  Laterality: Left;  BICEPS TENODESIS, MINI OPEN ROTATOR CUFF TEAR REPAIR WITH CUFFMEND AUGMENTATION   TOTAL KNEE REVISION Right 04/22/2017   Procedure: REVISION RIGHT TOTAL KNEE ARTHROPLASTY VS. POLY EXCHANGE;  Surgeon: Harden Jerona GAILS, MD;  Location: [facility] OR;  Service: Orthopedics;  Laterality: Right;     Current Outpatient Medications  Medication Sig Dispense Refill   atorvastatin  (LIPITOR) 10 MG tablet Take 1 tablet (10 mg total) by mouth 3 (three) times a week. 90 tablet 1   finasteride  (PROSCAR ) 5 MG tablet Take 1 tablet (5 mg total) by mouth daily. (Patient taking differently: Take 1 mg by mouth daily.) 90 tablet 1   lisinopril  (ZESTRIL ) 10 MG tablet Take 1 tablet by mouth daily. 90 tablet 0   tadalafil  (CIALIS ) 20 MG  tablet Take 1 tablet (20 mg total) by mouth daily as needed for erectile dysfunction. 30 tablet 1   tirzepatide  (MOUNJARO ) 15 MG/0.5ML Pen Inject 15 mg into the skin once a week. 6 mL 0   zolpidem  (AMBIEN  CR) 6.25 MG CR tablet Take 1 tablet (6.25 mg total) by mouth at bedtime as needed for sleep. 90 tablet 0   No current facility-administered medications for this visit.    Allergies:   Sulfa  antibiotics    Social History:  The patient  reports that he has never smoked. He has never been exposed to tobacco smoke. He has never used smokeless tobacco. He reports current alcohol use of about 1.0 standard drink of alcohol per week. He reports that he does not use drugs.   Family History:  The patient's family history includes Arthritis in his brother; Cancer in his mother; Diabetes in his mother; Heart disease (age of onset: 36) in his father; Mental illness in his mother.    ROS:  Please see the history of present illness.   Otherwise, review of systems are positive for none.   All other systems are reviewed and negative.    PHYSICAL EXAM: VS:  BP 136/78 (BP Location: Left Arm, Patient Position: Sitting)   Pulse 97   Ht 6' 1 (1.854 m)   Wt 200 lb (90.7 kg)   SpO2 96%   BMI 26.39 kg/m  , BMI Body mass index is 26.39 kg/m. GENERAL:  Well appearing HEENT:  Pupils equal round and reactive, fundi not visualized, oral mucosa unremarkable NECK:  No jugular venous distention, waveform within normal limits, carotid upstroke brisk and symmetric, no bruits, no thyromegaly LYMPHATICS:  No cervical, inguinal adenopathy LUNGS:  Clear to auscultation bilaterally BACK:  No CVA tenderness CHEST:  Unremarkable HEART:  PMI not displaced or sustained,S1 and S2 within normal limits, no S3, no S4, no clicks, no rubs, soft apical murmur nonradiating heard anteriorly, no diastolic murmurs ABD:  Flat, positive bowel sounds normal in frequency in pitch, no bruits, no rebound, no guarding, no midline pulsatile  mass, no hepatomegaly, no splenomegaly EXT:  2 plus pulses throughout, no edema, no cyanosis no clubbing SKIN:  No rashes no nodules NEURO:  Cranial nerves II through XII grossly intact, motor grossly intact throughout PSYCH:  Cognitively intact, oriented to person place and time    EKG:    Normal sinus rhythm, rate 88, axis within normal limits, intervals within normal limits, poor anterior R wave progression, 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    Recent Labs: 12/20/2023: ALT 24; BUN 14; Creatinine, Ser 0.63; Magnesium  2.0; Potassium 4.2;  Pro Brain Natriuretic Peptide 116.0; Sodium 141; TSH 0.956 01/18/2024: Hemoglobin 13.4; Platelets 296.0    Lipid Panel    Component Value Date/Time   CHOL 121 08/17/2023 0825   CHOL 163 09/24/2020 1056   TRIG 146.0 08/17/2023 0825   HDL 33.90 (L) 08/17/2023 0825   HDL 34 (L) 09/24/2020 1056   CHOLHDL 4 08/17/2023 0825   VLDL 29.2 08/17/2023 0825   LDLCALC 58 08/17/2023 0825   LDLCALC 70 09/24/2020 1056   LDLDIRECT 59.0 05/05/2018 0929      Wt Readings from Last 3 Encounters:  01/25/24 200 lb (90.7 kg)  01/18/24 197 lb 12.8 oz (89.7 kg)  12/20/23 196 lb 3.4 oz (89 kg)      Other studies Reviewed: Additional studies/ records that were reviewed today include: ED records, previous monitor, previous EKGs. Review of the above records demonstrates:  Please see elsewhere in the note.     ASSESSMENT AND PLAN:   PVCs:   The patient has premature ectopic complexes there would need no specific therapy.  He will however be monitored as below.  Syncope: The patient had this along with a sinus pause noted on previous monitor couple years ago.  I am going to start with a 4-week monitor.  We did talk about driving precautions.  If he has further syncope and nothing on the limited monitor that he wears I would have a low threshold for implanted loop.  Today in the office he was orthostatic.  MR: This  is moderate.  And then repeat an echocardiogram in 6 months and have a low threshold for TEE.  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  I will follow up with the echo above.   LVH:  This was mild.  Follow up clinically and with echo.     Current medicines are reviewed at length with the patient today.  The patient does not have concerns regarding medicines.  The following changes have been made:  no change  Labs/ tests ordered today include:   Orders Placed This Encounter  Procedures   CARDIAC EVENT MONITOR   ECHOCARDIOGRAM COMPLETE     Disposition:   FU with with me in Feb 2026    Signed, Lynwood Schilling, MD  01/25/2024 7:01 PM    [facility]

## 2024-01-25 ENCOUNTER — Encounter: Payer: Self-pay | Admitting: Cardiology

## 2024-01-25 ENCOUNTER — Ambulatory Visit: Attending: Cardiology | Admitting: Cardiology

## 2024-01-25 VITALS — BP 136/78 | HR 97 | Ht 73.0 in | Wt 200.0 lb

## 2024-01-25 DIAGNOSIS — I340 Nonrheumatic mitral (valve) insufficiency: Secondary | ICD-10-CM | POA: Diagnosis not present

## 2024-01-25 DIAGNOSIS — I517 Cardiomegaly: Secondary | ICD-10-CM | POA: Diagnosis not present

## 2024-01-25 DIAGNOSIS — R55 Syncope and collapse: Secondary | ICD-10-CM

## 2024-01-25 DIAGNOSIS — I493 Ventricular premature depolarization: Secondary | ICD-10-CM | POA: Diagnosis not present

## 2024-01-25 NOTE — Patient Instructions (Signed)
 Medication Instructions:  Your physician recommends that you continue on your current medications as directed. Please refer to the Current Medication list given to you today.  *If you need a refill on your cardiac medications before your next appointment, please call your pharmacy*  Lab Work: NONE If you have labs (blood work) drawn today and your tests are completely normal, you will receive your results only by: MyChart Message (if you have MyChart) OR A paper copy in the mail If you have any lab test that is abnormal or we need to change your treatment, we will call you to review the results.  Testing/Procedures: Echocardiogram in 6 months Your physician has requested that you have an echocardiogram. Echocardiography is a painless test that uses sound waves to create images of your heart. It provides your doctor with information about the size and shape of your heart and how well your heart's chambers and valves are working. This procedure takes approximately one hour. There are no restrictions for this procedure. Please do NOT wear cologne, perfume, aftershave, or lotions (deodorant is allowed). Please arrive 15 minutes prior to your appointment time.  Please note: We ask at that you not bring children with you during ultrasound (echo/ vascular) testing. Due to room size and safety concerns, children are not allowed in the ultrasound rooms during exams. Our front office staff cannot provide observation of children in our lobby area while testing is being conducted. An adult accompanying a patient to their appointment will only be allowed in the ultrasound room at the discretion of the ultrasound technician under special circumstances. We apologize for any inconvenience.  4 Week Event Monitor Your physician has recommended that you wear an event monitor. Event monitors are medical devices that record the heart's electrical activity. Doctors most often us  these monitors to diagnose  arrhythmias. Arrhythmias are problems with the speed or rhythm of the heartbeat. The monitor is a small, portable device. You can wear one while you do your normal daily activities. This is usually used to diagnose what is causing palpitations/syncope (passing out).   Follow-Up: At [facility], you and your health needs are our priority.  As part of our continuing mission to provide you with exceptional heart care, our providers are all part of one team.  This team includes your primary Cardiologist (physician) and Advanced Practice Providers or APPs (Physician Assistants and Nurse Practitioners) who all work together to provide you with the care you need, when you need it.  Your next appointment:   February 2026  Provider:   Lavona, MD  We recommend signing up for the patient portal called MyChart.  Sign up information is provided on this After Visit Summary.  MyChart is used to connect with patients for Virtual Visits (Telemedicine).  Patients are able to view lab/test results, encounter notes, upcoming appointments, etc.  Non-urgent messages can be sent to your provider as well.   To learn more about what you can do with MyChart, go to [email or web address].

## 2024-01-29 NOTE — Progress Notes (Signed)
 Screening BNP to evaluate for heart failure in a patient with multiple cardiac risk factors presenting for palpitations.

## 2024-02-04 ENCOUNTER — Other Ambulatory Visit (HOSPITAL_COMMUNITY): Payer: Self-pay

## 2024-02-04 DIAGNOSIS — G5601 Carpal tunnel syndrome, right upper limb: Secondary | ICD-10-CM | POA: Diagnosis not present

## 2024-02-04 MED ORDER — OXYCODONE HCL 5 MG PO TABS
5.0000 mg | ORAL_TABLET | Freq: Four times a day (QID) | ORAL | 0 refills | Status: AC | PRN
  Filled 2024-02-04: qty 20, 5d supply, fill #0

## 2024-02-06 ENCOUNTER — Other Ambulatory Visit: Payer: Self-pay | Admitting: Internal Medicine

## 2024-02-06 DIAGNOSIS — I10 Essential (primary) hypertension: Secondary | ICD-10-CM

## 2024-02-06 DIAGNOSIS — N401 Enlarged prostate with lower urinary tract symptoms: Secondary | ICD-10-CM

## 2024-02-06 DIAGNOSIS — E119 Type 2 diabetes mellitus without complications: Secondary | ICD-10-CM

## 2024-02-08 ENCOUNTER — Other Ambulatory Visit: Payer: Self-pay

## 2024-02-08 ENCOUNTER — Other Ambulatory Visit (HOSPITAL_COMMUNITY): Payer: Self-pay

## 2024-02-08 MED FILL — Lisinopril Tab 10 MG: 10.0000 mg | ORAL | 90 days supply | Qty: 90 | Fill #0 | Status: AC

## 2024-02-08 MED FILL — Finasteride Tab 5 MG: 5.0000 mg | ORAL | 90 days supply | Qty: 90 | Fill #0 | Status: AC

## 2024-02-08 MED FILL — Tirzepatide Soln Auto-injector 15 MG/0.5ML: 15.0000 mg | SUBCUTANEOUS | 84 days supply | Qty: 6 | Fill #0 | Status: CN

## 2024-02-09 ENCOUNTER — Other Ambulatory Visit: Payer: Self-pay

## 2024-02-10 ENCOUNTER — Other Ambulatory Visit (HOSPITAL_COMMUNITY): Payer: Self-pay

## 2024-02-10 ENCOUNTER — Encounter: Payer: Self-pay | Admitting: Family Medicine

## 2024-02-10 MED ORDER — OSELTAMIVIR PHOSPHATE 75 MG PO CAPS
75.0000 mg | ORAL_CAPSULE | Freq: Two times a day (BID) | ORAL | 0 refills | Status: AC
  Filled 2024-02-10 (×2): qty 10, 5d supply, fill #0

## 2024-02-16 ENCOUNTER — Other Ambulatory Visit (HOSPITAL_COMMUNITY): Payer: Self-pay

## 2024-02-22 ENCOUNTER — Ambulatory Visit (INDEPENDENT_AMBULATORY_CARE_PROVIDER_SITE_OTHER): Admitting: Orthopedic Surgery

## 2024-02-22 DIAGNOSIS — Z9889 Other specified postprocedural states: Secondary | ICD-10-CM

## 2024-02-22 NOTE — Progress Notes (Signed)
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  Doing well overall, has noticed an improvement from preoperative symptoms from the numbness and tingling standpoint.  Pain is well-controlled  Pertinent ROS were reviewed with the patient and found to be negative unless otherwise specified above in HPI.   Assessment & Plan: Visit Diagnoses:  1. S/P carpal tunnel release     Plan: He is doing quite well postoperatively.  Sutures removed today.  He would like to follow-up as needed which is appropriate.  He can send me a message for any issues or return as needed moving forward.  Follow-up: No follow-ups on file.   Meds & Orders: No orders of the defined types were placed in this encounter.  No orders of the defined types were placed in this encounter.    Procedures: No procedures performed       Objective:   Vital Signs: There were no vitals taken for this visit.  Ortho Exam Right hand: - Well-healed palmar incision, sutures removed, skin edges well-approximated without erythema or drainage - Composite fist without restriction - Sensation intact light touch median nerve distribution, slightly diminished at the distal aspect of the long - 5/5 APB no thenar atrophy, thumb opposition well-preserved   Imaging: No results found.   Bocephus Cali Afton Alderton, M.D. [facility], Hand Surgery  "

## 2024-02-23 ENCOUNTER — Encounter (HOSPITAL_COMMUNITY): Payer: Self-pay | Admitting: Orthopedic Surgery

## 2024-02-23 ENCOUNTER — Encounter: Admitting: Rehabilitative and Restorative Service Providers"

## 2024-03-01 ENCOUNTER — Ambulatory Visit

## 2024-03-01 DIAGNOSIS — R55 Syncope and collapse: Secondary | ICD-10-CM

## 2024-03-02 DIAGNOSIS — R55 Syncope and collapse: Secondary | ICD-10-CM | POA: Diagnosis not present

## 2024-03-04 ENCOUNTER — Ambulatory Visit: Payer: Self-pay | Admitting: Cardiology

## 2024-03-04 DIAGNOSIS — I4729 Other ventricular tachycardia: Secondary | ICD-10-CM

## 2024-03-04 NOTE — Telephone Encounter (Signed)
 Spoke with pt regarding results and Dr. Denver suggestions. Pt agreeable to plan. Exercise tolerance test ordered. Attestation pended. Pt aware of instructions. Pt verbalized understanding. All questions if any were answered.

## 2024-03-04 NOTE — Telephone Encounter (Signed)
-----   Message from Lynwood Schilling, MD sent at 03/04/2024  4:53 PM EST ----- I spoke with the patient.  He had a run of NSVT.  I would like to screen for obstructive CAD with a POET (Plain Old Exercise Treadmill).  Please schedule prior to his appt with me later this month.   Thanks.

## 2024-03-09 ENCOUNTER — Telehealth (HOSPITAL_COMMUNITY): Payer: Self-pay | Admitting: *Deleted

## 2024-03-09 NOTE — Telephone Encounter (Signed)
 Reminder all given with instructions for upcoming GXT on 03/11/24 at 12:45

## 2024-03-11 ENCOUNTER — Ambulatory Visit (HOSPITAL_COMMUNITY)
Admission: RE | Admit: 2024-03-11 | Discharge: 2024-03-11 | Disposition: A | Source: Ambulatory Visit | Attending: Cardiology | Admitting: Cardiology

## 2024-03-11 DIAGNOSIS — I4729 Other ventricular tachycardia: Secondary | ICD-10-CM | POA: Insufficient documentation

## 2024-03-11 LAB — EXERCISE TOLERANCE TEST
Angina Index: 0
Duke Treadmill Score: 9
Estimated workload: 10.3
Exercise duration (min): 9 min
Exercise duration (sec): 8 s
MPHR: 145 {beats}/min
Peak HR: 141 {beats}/min
Percent HR: 97 %
RPE: 18
Rest HR: 63 {beats}/min
ST Depression (mm): 0 mm

## 2024-03-11 LAB — OPHTHALMOLOGY REPORT-SCANNED

## 2024-03-13 ENCOUNTER — Ambulatory Visit: Payer: Self-pay | Admitting: Cardiology

## 2024-03-24 ENCOUNTER — Other Ambulatory Visit: Payer: Self-pay | Admitting: Nurse Practitioner

## 2024-03-24 ENCOUNTER — Other Ambulatory Visit: Payer: Self-pay

## 2024-03-24 DIAGNOSIS — J0140 Acute pansinusitis, unspecified: Secondary | ICD-10-CM

## 2024-03-24 MED ORDER — AZITHROMYCIN 500 MG PO TABS
500.0000 mg | ORAL_TABLET | Freq: Every day | ORAL | 0 refills | Status: AC
  Filled 2024-03-24 – 2024-03-25 (×2): qty 5, 5d supply, fill #0

## 2024-03-24 MED ORDER — AZITHROMYCIN 500 MG PO TABS
500.0000 mg | ORAL_TABLET | Freq: Every day | ORAL | 0 refills | Status: DC
  Filled 2024-03-24: qty 5, 5d supply, fill #0

## 2024-03-24 NOTE — Progress Notes (Signed)
 Concerns with sinus congestion and pressure with drainage present for about a week with symptoms worsening. Will send azithromycin  for management.

## 2024-03-25 ENCOUNTER — Other Ambulatory Visit: Payer: Self-pay

## 2024-03-25 ENCOUNTER — Other Ambulatory Visit (HOSPITAL_COMMUNITY): Payer: Self-pay

## 2024-03-25 MED FILL — Finasteride Tab 5 MG: 5.0000 mg | ORAL | 90 days supply | Qty: 90 | Fill #1 | Status: CN

## 2024-04-15 ENCOUNTER — Ambulatory Visit: Admitting: Cardiology

## 2024-07-25 ENCOUNTER — Ambulatory Visit (HOSPITAL_COMMUNITY)
